# Patient Record
Sex: Male | Born: 1950
Health system: Southern US, Community
[De-identification: ages and names within clinical notes are randomized; demographics above are authoritative.]

## PROBLEM LIST (undated history)

## (undated) VITALS — BP 118/80 | HR 106 | Temp 97.0°F | Resp 18 | Ht 69.0 in | Wt 191.0 lb

## (undated) DIAGNOSIS — F1920 Other psychoactive substance dependence, uncomplicated: Secondary | ICD-10-CM

## (undated) DIAGNOSIS — H9319 Tinnitus, unspecified ear: Secondary | ICD-10-CM

## (undated) DIAGNOSIS — F1010 Alcohol abuse, uncomplicated: Secondary | ICD-10-CM

## (undated) DIAGNOSIS — E7800 Pure hypercholesterolemia, unspecified: Secondary | ICD-10-CM

## (undated) DIAGNOSIS — F32A Depression, unspecified: Secondary | ICD-10-CM

## (undated) DIAGNOSIS — C61 Malignant neoplasm of prostate: Secondary | ICD-10-CM

## (undated) DIAGNOSIS — I160 Hypertensive urgency: Secondary | ICD-10-CM

## (undated) DIAGNOSIS — F419 Anxiety disorder, unspecified: Secondary | ICD-10-CM

## (undated) DIAGNOSIS — F329 Major depressive disorder, single episode, unspecified: Secondary | ICD-10-CM

## (undated) HISTORY — DX: Malignant neoplasm of prostate: C61

## (undated) HISTORY — PX: TONSILLECTOMY: SUR1361

## (undated) HISTORY — DX: Pure hypercholesterolemia, unspecified: E78.00

## (undated) HISTORY — PX: WISDOM TOOTH EXTRACTION: SHX21

## (undated) HISTORY — DX: Tinnitus, unspecified ear: H93.19

---

## 2009-12-18 ENCOUNTER — Ambulatory Visit (HOSPITAL_COMMUNITY): Admission: RE | Admit: 2009-12-18 | Discharge: 2009-12-18 | Payer: Self-pay | Admitting: Psychiatry

## 2009-12-22 ENCOUNTER — Other Ambulatory Visit (HOSPITAL_COMMUNITY): Admission: RE | Admit: 2009-12-22 | Discharge: 2010-01-09 | Payer: Self-pay | Admitting: Psychiatry

## 2010-01-12 ENCOUNTER — Ambulatory Visit: Payer: Self-pay | Admitting: Psychiatry

## 2010-01-20 ENCOUNTER — Ambulatory Visit (HOSPITAL_COMMUNITY): Payer: Self-pay | Admitting: Psychiatry

## 2010-08-22 ENCOUNTER — Emergency Department (HOSPITAL_COMMUNITY)
Admission: EM | Admit: 2010-08-22 | Discharge: 2010-08-23 | Disposition: A | Discharge status: Other Acute Inpatient Hospital | Payer: 59 | Attending: Emergency Medicine | Admitting: Emergency Medicine

## 2010-08-22 DIAGNOSIS — F329 Major depressive disorder, single episode, unspecified: Secondary | ICD-10-CM | POA: Insufficient documentation

## 2010-08-22 DIAGNOSIS — F411 Generalized anxiety disorder: Secondary | ICD-10-CM | POA: Insufficient documentation

## 2010-08-22 DIAGNOSIS — F3289 Other specified depressive episodes: Secondary | ICD-10-CM | POA: Insufficient documentation

## 2010-08-22 DIAGNOSIS — R45851 Suicidal ideations: Secondary | ICD-10-CM | POA: Insufficient documentation

## 2010-08-22 LAB — COMPREHENSIVE METABOLIC PANEL
ALT: 35 U/L (ref 0–53)
AST: 23 U/L (ref 0–37)
Alkaline Phosphatase: 84 U/L (ref 39–117)
CO2: 25 mEq/L (ref 19–32)
Chloride: 105 mEq/L (ref 96–112)
GFR calc Af Amer: >60 mL/min (ref >60)
GFR calc non Af Amer: >60 mL/min (ref >60)
Glucose, Bld: 112 mg/dL — ABNORMAL HIGH (ref 70–99)
Sodium: 140 mEq/L (ref 135–145)
Total Bilirubin: 0.8 mg/dL (ref 0.3–1.2)

## 2010-08-22 LAB — ETHANOL: Alcohol, Ethyl (B): <5 mg/dL (ref 0–10)

## 2010-08-22 LAB — DIFFERENTIAL
Basophils Relative: 1 % (ref 0–1)
Eosinophils Absolute: 0.1 10*3/uL (ref 0.0–0.7)
Eosinophils Relative: 1 % (ref 0–5)
Lymphs Abs: 2 10*3/uL (ref 0.7–4.0)
Monocytes Relative: 7 % (ref 3–12)
Neutrophils Relative %: 70 % (ref 43–77)

## 2010-08-22 LAB — CBC
MCH: 29.9 pg (ref 26.0–34.0)
MCV: 85.9 fL (ref 78.0–100.0)
Platelets: 265 10*3/uL (ref 150–400)
RBC: 5.62 MIL/uL (ref 4.22–5.81)
RDW: 13.1 % (ref 11.5–15.5)
WBC: 9.1 10*3/uL (ref 4.0–10.5)

## 2010-08-22 LAB — RAPID URINE DRUG SCREEN, HOSP PERFORMED: Barbiturates: NOT DETECTED

## 2010-08-23 ENCOUNTER — Inpatient Hospital Stay (HOSPITAL_COMMUNITY)
Admission: RE | Admit: 2010-08-23 | Discharge: 2010-09-23 | DRG: 885 | Disposition: A | Discharge status: Home / Self Care | Payer: 59 | Source: Ambulatory Visit | Attending: Psychiatry | Admitting: Psychiatry

## 2010-08-23 DIAGNOSIS — F332 Major depressive disorder, recurrent severe without psychotic features: Principal | ICD-10-CM

## 2010-08-23 DIAGNOSIS — F329 Major depressive disorder, single episode, unspecified: Secondary | ICD-10-CM

## 2010-08-23 DIAGNOSIS — I10 Essential (primary) hypertension: Secondary | ICD-10-CM

## 2010-08-23 DIAGNOSIS — Z560 Unemployment, unspecified: Secondary | ICD-10-CM

## 2010-08-23 DIAGNOSIS — Z6379 Other stressful life events affecting family and household: Secondary | ICD-10-CM

## 2010-08-23 DIAGNOSIS — Z818 Family history of other mental and behavioral disorders: Secondary | ICD-10-CM

## 2010-08-23 DIAGNOSIS — F411 Generalized anxiety disorder: Secondary | ICD-10-CM

## 2010-08-23 DIAGNOSIS — IMO0001 Reserved for inherently not codable concepts without codable children: Secondary | ICD-10-CM

## 2010-08-23 DIAGNOSIS — F1421 Cocaine dependence, in remission: Secondary | ICD-10-CM

## 2010-08-23 DIAGNOSIS — R45851 Suicidal ideations: Secondary | ICD-10-CM

## 2010-08-23 DIAGNOSIS — E785 Hyperlipidemia, unspecified: Secondary | ICD-10-CM

## 2010-08-24 DIAGNOSIS — F1421 Cocaine dependence, in remission: Secondary | ICD-10-CM

## 2010-08-24 DIAGNOSIS — F411 Generalized anxiety disorder: Secondary | ICD-10-CM

## 2010-08-24 DIAGNOSIS — F332 Major depressive disorder, recurrent severe without psychotic features: Secondary | ICD-10-CM
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
## 2010-08-25 LAB — LIPID PANEL: VLDL: UNDETERMINED mg/dL (ref 0–40)

## 2010-08-25 LAB — FOLATE: Folate: 14.1 ng/mL

## 2010-08-25 LAB — VITAMIN B12: Vitamin B-12: 393 pg/mL (ref 211–911)

## 2010-08-25 LAB — TSH: TSH: 0.999 u[IU]/mL (ref 0.350–4.500)

## 2010-08-27 DIAGNOSIS — F1420 Cocaine dependence, uncomplicated: Secondary | ICD-10-CM

## 2010-08-27 DIAGNOSIS — F411 Generalized anxiety disorder: Secondary | ICD-10-CM

## 2010-08-27 DIAGNOSIS — F332 Major depressive disorder, recurrent severe without psychotic features: Secondary | ICD-10-CM

## 2010-08-29 LAB — VITAMIN D 1,25 DIHYDROXY
Vitamin D 1, 25 (OH)2 Total: 38 pg/mL (ref 18–72)
Vitamin D2 1, 25 (OH)2: <8 pg/mL
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
## 2011-12-13 ENCOUNTER — Emergency Department (HOSPITAL_COMMUNITY)
Admission: EM | Admit: 2011-12-13 | Discharge: 2011-12-13 | Disposition: A | Discharge status: Home / Self Care | Payer: Self-pay | Attending: Emergency Medicine | Admitting: Emergency Medicine

## 2011-12-13 ENCOUNTER — Encounter (HOSPITAL_COMMUNITY): Payer: Self-pay | Admitting: Emergency Medicine

## 2011-12-13 DIAGNOSIS — F32A Depression, unspecified: Secondary | ICD-10-CM | POA: Insufficient documentation

## 2011-12-13 DIAGNOSIS — F329 Major depressive disorder, single episode, unspecified: Secondary | ICD-10-CM | POA: Insufficient documentation

## 2011-12-13 DIAGNOSIS — F1920 Other psychoactive substance dependence, uncomplicated: Secondary | ICD-10-CM | POA: Insufficient documentation

## 2011-12-13 DIAGNOSIS — F411 Generalized anxiety disorder: Secondary | ICD-10-CM | POA: Insufficient documentation

## 2011-12-13 DIAGNOSIS — F419 Anxiety disorder, unspecified: Secondary | ICD-10-CM | POA: Insufficient documentation

## 2011-12-13 HISTORY — DX: Depression, unspecified: F32.A

## 2011-12-13 HISTORY — DX: Major depressive disorder, single episode, unspecified: F32.9

## 2011-12-13 HISTORY — DX: Other psychoactive substance dependence, uncomplicated: F19.20

## 2011-12-13 HISTORY — DX: Anxiety disorder, unspecified: F41.9

## 2011-12-13 LAB — RAPID URINE DRUG SCREEN, HOSP PERFORMED
Amphetamines: NOT DETECTED
Barbiturates: NOT DETECTED
Opiates: NOT DETECTED
Tetrahydrocannabinol: NOT DETECTED

## 2011-12-13 MED ORDER — LORAZEPAM 1 MG PO TABS
0.5000 mg | ORAL_TABLET | Freq: Once | ORAL | Status: AC
  Administered 2011-12-13: 0.5 mg via ORAL
  Filled 2011-12-13: qty 1

## 2011-12-13 MED ORDER — SODIUM CHLORIDE 0.9 % IV BOLUS (SEPSIS)
1000.0000 mL | Freq: Once | INTRAVENOUS | Status: AC
  Administered 2011-12-13: 1000 mL via INTRAVENOUS

## 2011-12-13 NOTE — ED Notes (Signed)
Patient states tonight he took his normal sleeping medication (Abilify); patient states that he started doing activities around the house (rather than lying down and trying to sleep), and patient forgot that he took his Ability already for the night.  Patient took a second tablet and states that he "went crazy" during the next hour and a half -- patient felt that he was watching a movie about his own life.  Patient was found in the closet by roommates -- patient felt like he was trapped and trying to get out.  EMS was called.  Upon EMS arrival, patient started to become more alert and oriented.  Upon arrival to ED, patient alert and oriented x4; PERRL present.  Will continue to monitor.

## 2011-12-13 NOTE — ED Provider Notes (Addendum)
History     CSN: [id number]  Arrival date & time 12/13/11  0154   First MD Initiated Contact with Patient 12/13/11 0204      Chief Complaint  Patient presents with  . Anxiety    (Consider location/radiation/quality/duration/timing/severity/associated sxs/prior treatment) HPI Comments: Patient presents by EMS from a recovery house for drug and alcohol addiction for anxiety.  Patient states he takes Abilify, Seroquel and Effexor for anxiety and depression.  He's been taking these as normal.  He takes Ambien for sleep and believes he may have taken an extra dose this evening but is not sure.  He notes having some kind of possible hallucinations or dreams where he felt he was being chased and handcuffed.  Apparently at the recovery house police were called because there is concerns about hallucinations and EMS arrived.  Patient has been coherent for EMS.  There is no report of any violent behavior or other incident.  Patient denies any pain or injury at this time.  Patient endorses that he is very anxious is very concerned about the possible hallucinations or dreams that he had.  He is fully alert and oriented at this time though.  Patient is a 61 y.o. male presenting with anxiety. The history is provided by the patient and the EMS personnel.  Anxiety This is a recurrent problem. Pertinent negatives include no chest pain, no abdominal pain, no headaches and no shortness of breath.    Past Medical History  Diagnosis Date  . Anxiety   . Depression   . Drug addiction     History reviewed. No pertinent past surgical history.  History reviewed. No pertinent family history.  History  Substance Use Topics  . Smoking status: Never Smoker   . Smokeless tobacco: Not on file  . Alcohol Use: No      Review of Systems  Constitutional: Negative.  Negative for fever and chills.  HENT: Negative.   Eyes: Negative.   Respiratory: Negative.  Negative for cough and shortness of breath.     Cardiovascular: Negative.  Negative for chest pain.  Gastrointestinal: Negative.  Negative for nausea, vomiting and abdominal pain.  Genitourinary: Negative.   Musculoskeletal: Negative.  Negative for back pain.  Skin: Negative.  Negative for color change and rash.  Neurological: Negative for syncope and headaches.  Hematological: Negative.  Negative for adenopathy.  Psychiatric/Behavioral: Negative for confusion. The patient is nervous/anxious.   All other systems reviewed and are negative.    Allergies  Review of patient's allergies indicates no known allergies.  Home Medications  No current outpatient prescriptions on file.  SpO2 96%  Physical Exam  Nursing note and vitals reviewed. Constitutional: He is oriented to person, place, and time. He appears well-developed and well-nourished.  Non-toxic appearance. He does not have a sickly appearance.  HENT:  Head: Normocephalic and atraumatic.  Eyes: Conjunctivae, EOM and lids are normal. Pupils are equal, round, and reactive to light.  Neck: Trachea normal, normal range of motion and full passive range of motion without pain. Neck supple.  Cardiovascular: Normal rate, regular rhythm and normal heart sounds.   Pulmonary/Chest: Effort normal and breath sounds normal. No respiratory distress.  Abdominal: Soft. Normal appearance. He exhibits no distension. There is no tenderness. There is no rebound and no CVA tenderness.  Musculoskeletal: Normal range of motion.  Neurological: He is alert and oriented to person, place, and time. He has normal strength.  Skin: Skin is warm, dry and intact. No rash noted.  Psychiatric:  His behavior is normal. Judgment and thought content normal.       Patient is anxious but otherwise has appropriate mood and affect.  His thought content judgment is clear and appropriate.  There's no tangential thinking.  Patient denies any homicidal or suicidal thoughts.    ED Course  Procedures (including critical  care time)  Results for orders placed during the hospital encounter of 12/13/11  ETHANOL      Component Value Range   Alcohol, Ethyl (B) <11  0 - 11 mg/dL  URINE RAPID DRUG SCREEN (HOSP PERFORMED)      Component Value Range   Opiates NONE DETECTED  NONE DETECTED   Cocaine NONE DETECTED  NONE DETECTED   Benzodiazepines NONE DETECTED  NONE DETECTED   Amphetamines NONE DETECTED  NONE DETECTED   Tetrahydrocannabinol NONE DETECTED  NONE DETECTED   Barbiturates NONE DETECTED  NONE DETECTED       MDM  Patient with what appears to be purely anxiety but no true hallucinations or suicidal or homicidal ideation at this time.  Patient has no symptoms for infection or altered mental status at this time.  Given patient was seen in recovery house for drug and alcohol addiction I will check for any substance abuse in this gentleman that might alter his anxiety levels.  I will give him some Ativan for his anxiety and anticipate the patient will be able to be safely discharged back to his recovery house.        Nat Christen, MD 12/13/11 0210 Patient is improved after receiving a small dose of Ativan here.  His drug and alcohol screens are negative consistent with the patient's story.  Patient is now safe for discharge back to his recovery facility.  Nat Christen, MD 12/13/11 [phone]

## 2011-12-13 NOTE — ED Notes (Addendum)
Per EMS, patient took unknown sleep aid tonight to help him sleep; was not able to sleep, so took two more tablets.  When patient awoken, patient felt very anxious - called EMS.  Patient has been under a lot of stress with his family lately.  Patient's roommates states that the last time an episode like this occurred, patient stopped taking his psych medications (patient reports taking his psych medications on a daily basis; denies missing dose).  Denies suicidal and homicidal thoughts.

## 2011-12-13 NOTE — ED Notes (Signed)
Pt updated by MD, pt to be discharged home soon.

## 2011-12-13 NOTE — Discharge Instructions (Signed)

## 2012-01-07 IMAGING — CR DG HAND 2V*R*
3 series · 3 of 3 positions shown · non-contrast
Comparison: None.

CLINICAL DATA: Fifth digit laceration

RIGHT HAND - 2 VIEW

[x hand pa right (1 of 2)]
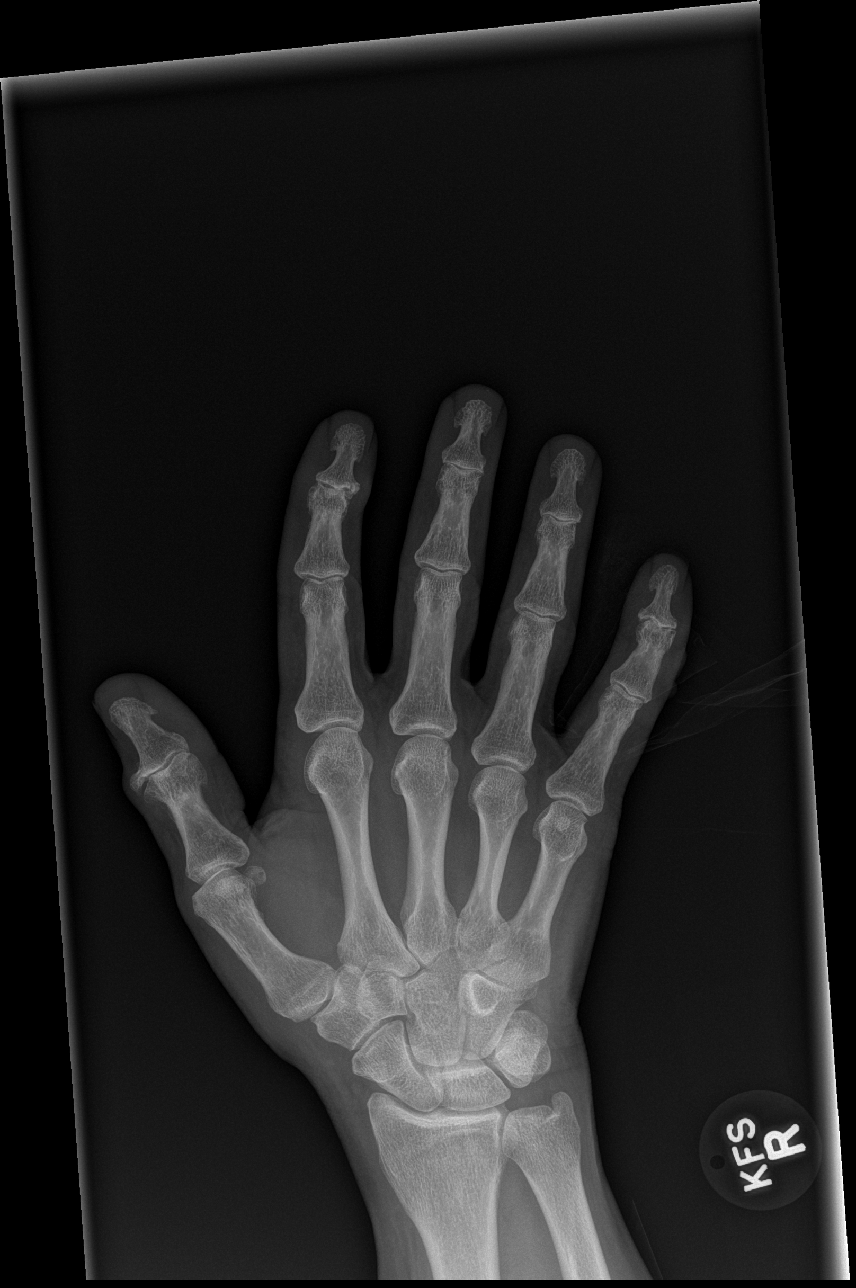

[x hand pa right (2 of 2)]
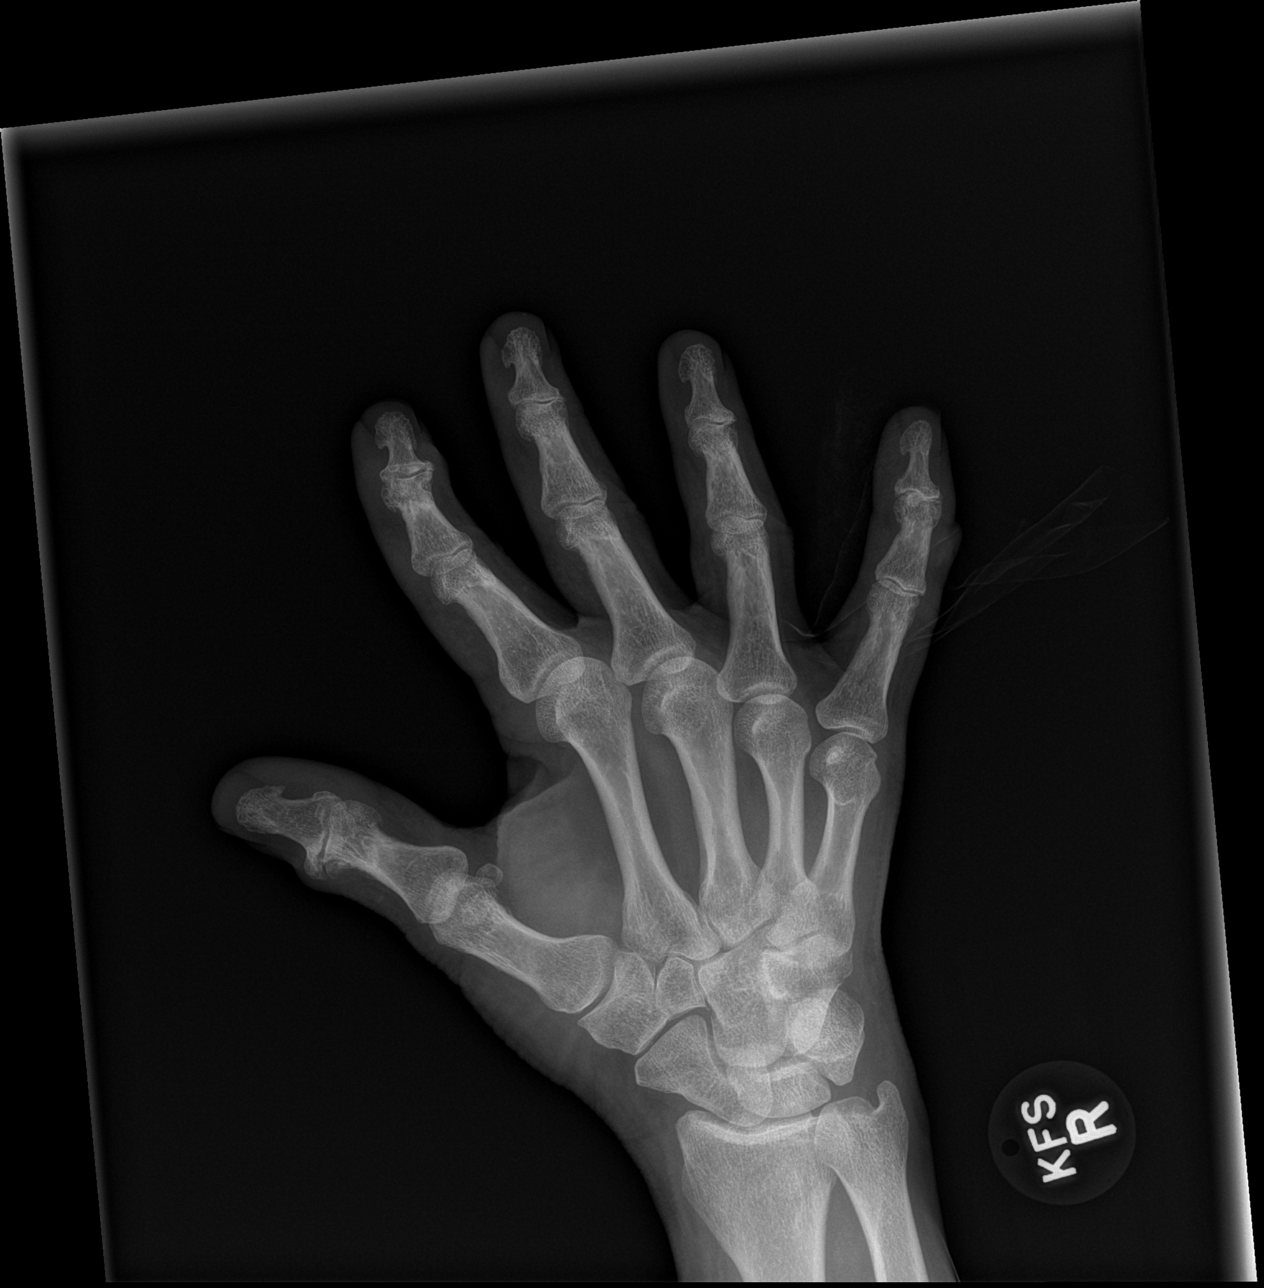

[x hand lat right]
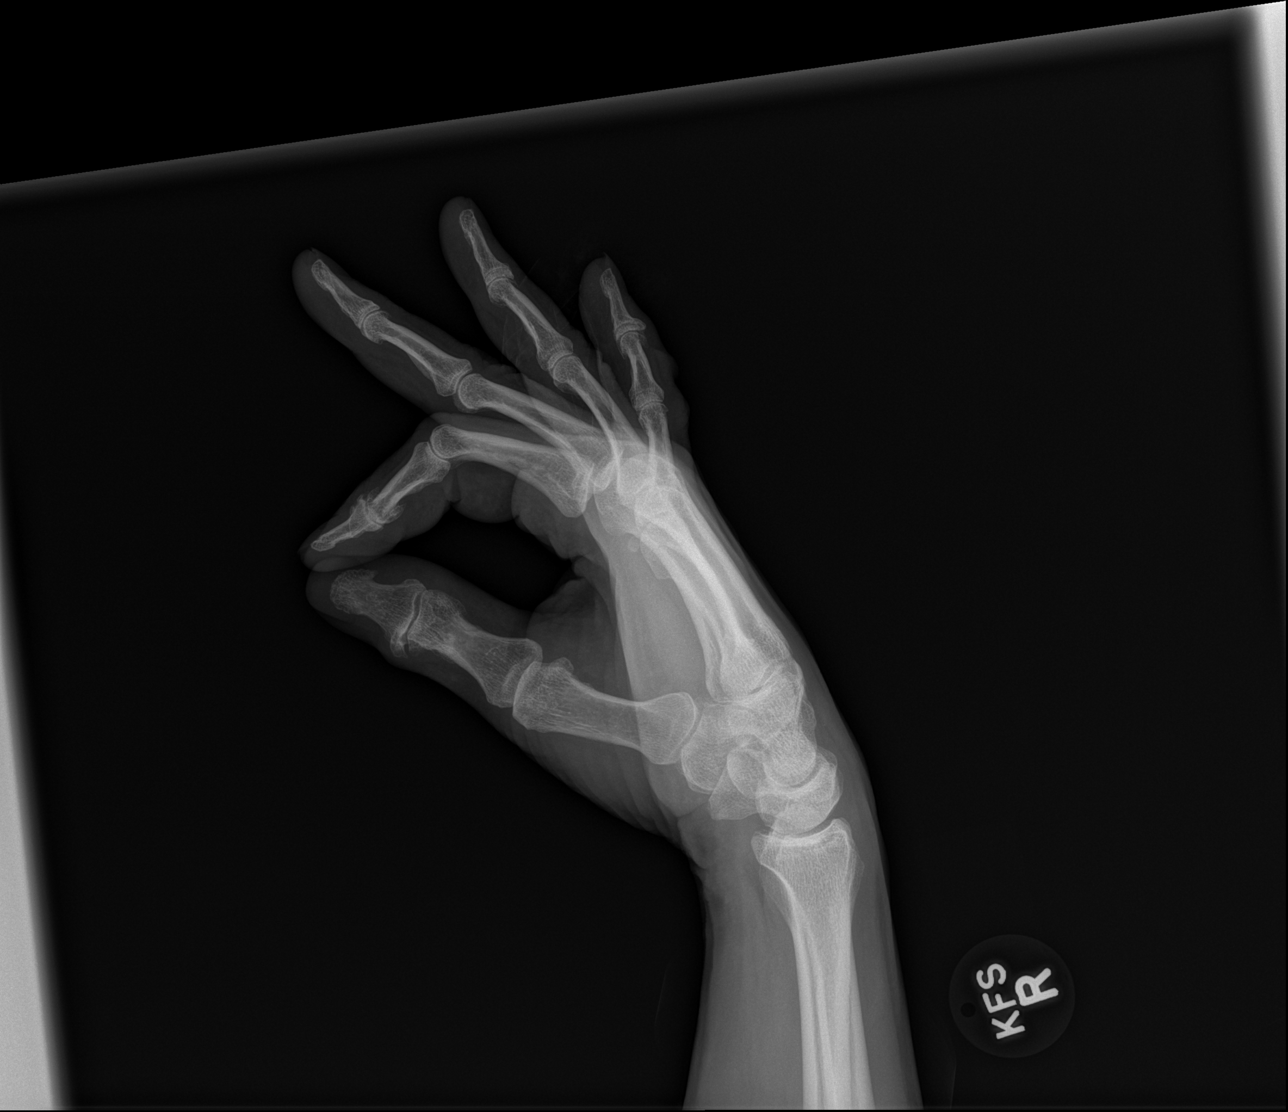

[3 of 3 positions shown; findings below may reference images not displayed]

FINDINGS: Osteoarthritic degenerative changes of the DIP joints and
first interphalangeal joint no acute fracture or dislocation
identified.  Bandage overlies the fifth digit in keeping with the
stated history of laceration.  No radiopaque foreign body.
IMPRESSION: Bandage overlies the fifth digit.  No radiopaque foreign body or
acute osseous abnormality.

## 2012-03-30 ENCOUNTER — Encounter (HOSPITAL_COMMUNITY): Payer: Self-pay | Admitting: Emergency Medicine

## 2012-03-30 ENCOUNTER — Emergency Department (HOSPITAL_COMMUNITY)
Admission: EM | Admit: 2012-03-30 | Discharge: 2012-03-31 | Disposition: A | Discharge status: Other Acute Inpatient Hospital | Payer: Self-pay | Attending: Emergency Medicine | Admitting: Emergency Medicine

## 2012-03-30 DIAGNOSIS — R45851 Suicidal ideations: Secondary | ICD-10-CM | POA: Insufficient documentation

## 2012-03-30 DIAGNOSIS — F1910 Other psychoactive substance abuse, uncomplicated: Secondary | ICD-10-CM

## 2012-03-30 DIAGNOSIS — F329 Major depressive disorder, single episode, unspecified: Secondary | ICD-10-CM

## 2012-03-30 DIAGNOSIS — F3289 Other specified depressive episodes: Secondary | ICD-10-CM | POA: Insufficient documentation

## 2012-03-30 LAB — CBC WITH DIFFERENTIAL/PLATELET
Basophils Relative: 1 % (ref 0–1)
Eosinophils Relative: 1 % (ref 0–5)
HCT: 43.5 % (ref 39.0–52.0)
Hemoglobin: 15.7 g/dL (ref 13.0–17.0)
Lymphocytes Relative: 29 % (ref 12–46)
MCHC: 36.1 g/dL — ABNORMAL HIGH (ref 30.0–36.0)
MCV: 82.4 fL (ref 78.0–100.0)
Monocytes Absolute: 0.5 10*3/uL (ref 0.1–1.0)
Monocytes Relative: 6 % (ref 3–12)
Neutro Abs: 4.5 10*3/uL (ref 1.7–7.7)

## 2012-03-30 LAB — COMPREHENSIVE METABOLIC PANEL
BUN: 15 mg/dL (ref 6–23)
CO2: 25 mEq/L (ref 19–32)
Chloride: 99 mEq/L (ref 96–112)
Creatinine, Ser: 1.04 mg/dL (ref 0.50–1.35)
GFR calc non Af Amer: 77 mL/min — ABNORMAL LOW (ref >90)
Total Bilirubin: 0.3 mg/dL (ref 0.3–1.2)

## 2012-03-30 LAB — RAPID URINE DRUG SCREEN, HOSP PERFORMED
Barbiturates: NOT DETECTED
Cocaine: NOT DETECTED

## 2012-03-30 LAB — ETHANOL: Alcohol, Ethyl (B): <11 mg/dL (ref 0–11)

## 2012-03-30 LAB — ACETAMINOPHEN LEVEL: Acetaminophen (Tylenol), Serum: <15 ug/mL (ref 10–30)

## 2012-03-30 MED ORDER — ACETAMINOPHEN 325 MG PO TABS: 650.0000 mg | ORAL_TABLET | ORAL | Status: DC | PRN

## 2012-03-30 MED ORDER — IBUPROFEN 600 MG PO TABS: 600.0000 mg | ORAL_TABLET | Freq: Three times a day (TID) | ORAL | Status: DC | PRN

## 2012-03-30 MED ORDER — ONDANSETRON HCL 4 MG PO TABS: 4.0000 mg | ORAL_TABLET | Freq: Three times a day (TID) | ORAL | Status: DC | PRN

## 2012-03-30 MED ORDER — LORAZEPAM 1 MG PO TABS
1.0000 mg | ORAL_TABLET | Freq: Three times a day (TID) | ORAL | Status: DC | PRN
  Administered 2012-03-30: 1 mg via ORAL
  Filled 2012-03-30: qty 1
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
## 2012-03-30 NOTE — ED Notes (Signed)
Report to Chari Manning, RN in psyc ED

## 2012-03-30 NOTE — ED Notes (Signed)
Is depressed, feels like wants to hurt self. Has hx of substance abuse- no particular substance-- will use anything, has lived in [facility] for 11 months, but used oxycontin 3 days ago, failed drug test today, now hopeless, now depressed.

## 2012-03-30 NOTE — ED Provider Notes (Signed)
History     CSN: [id number]  Arrival date & time 03/30/12  1302   First MD Initiated Contact with Patient 03/30/12 1320      Chief Complaint  Patient presents with  . Medical Clearance  . Depression    (Consider location/radiation/quality/duration/timing/severity/associated sxs/prior treatment) HPI Comments: Frank [name] is a 61 y.o. Male who presents with complaint of depression and SI. Pt states he is depressed, has not desire to do anything, just wants to stay in bed all day. States in [facility] house but got kicked out for buying oxycodone and using off the street. States hx of severe depression, has been through inpatient and outpatient treatments a year ago, states it helped. Pt does have a psychiatrist and is on medications, states taking them every day. States does not drink alcohol. States is thinking about suicide because "everything is falling apart and I do not know what to do and dont want to live like this."  Pt denies a plan. Pt has no medical problems.   The history is provided by the patient.    Past Medical History  Diagnosis Date  . Anxiety   . Depression   . Drug addiction     History reviewed. No pertinent past surgical history.  No family history on file.  History  Substance Use Topics  . Smoking status: Never Smoker   . Smokeless tobacco: Not on file  . Alcohol Use: No      Review of Systems  Constitutional: Negative for fever and chills.  HENT: Negative for neck pain and neck stiffness.   Respiratory: Negative.   Cardiovascular: Negative.   Genitourinary: Negative for dysuria.  Musculoskeletal: Negative.   Skin: Negative.   Neurological: Negative for dizziness, weakness and numbness.  Psychiatric/Behavioral: Positive for suicidal ideas. The patient is nervous/anxious.     Allergies  Review of patient's allergies indicates no known allergies.  Home Medications  No current outpatient prescriptions on file.  BP 124/94  Pulse 106  Temp 98.9  F (37.2 C) (Oral)  Resp 18  SpO2 96%  Physical Exam  Vitals reviewed. Constitutional: He is oriented to person, place, and time. He appears well-developed and well-nourished. No distress.  HENT:  Head: Normocephalic and atraumatic.  Neck: Neck supple.  Cardiovascular: Normal rate, regular rhythm and normal heart sounds.   Pulmonary/Chest: Effort normal and breath sounds normal. No respiratory distress. He has no wheezes. He has no rales.  Abdominal: Soft. Bowel sounds are normal. He exhibits no distension. There is no tenderness. There is no rebound.  Musculoskeletal: Normal range of motion.  Neurological: He is alert and oriented to person, place, and time.  Skin: Skin is warm and dry.  Psychiatric: He has a normal mood and affect. His behavior is normal. Thought content normal.    ED Course  Procedures (including critical care time)  Results for orders placed during the hospital encounter of 03/30/12  URINE RAPID DRUG SCREEN (HOSP PERFORMED)      Component Value Range   Opiates NONE DETECTED  NONE DETECTED   Cocaine NONE DETECTED  NONE DETECTED   Benzodiazepines NONE DETECTED  NONE DETECTED   Amphetamines NONE DETECTED  NONE DETECTED   Tetrahydrocannabinol NONE DETECTED  NONE DETECTED   Barbiturates NONE DETECTED  NONE DETECTED  CBC WITH DIFFERENTIAL      Component Value Range   WBC 7.1  4.0 - 10.5 K/uL   RBC 5.28  4.22 - 5.81 MIL/uL   Hemoglobin 15.7  13.0 -  17.0 g/dL   HCT 16.1  09.6 - 04.5 %   MCV 82.4  78.0 - 100.0 fL   MCH 29.7  26.0 - 34.0 pg   MCHC 36.1 (*) 30.0 - 36.0 g/dL   RDW 40.9  81.1 - 91.4 %   Platelets 288  150 - 400 K/uL   Neutrophils Relative 63  43 - 77 %   Neutro Abs 4.5  1.7 - 7.7 K/uL   Lymphocytes Relative 29  12 - 46 %   Lymphs Abs 2.0  0.7 - 4.0 K/uL   Monocytes Relative 6  3 - 12 %   Monocytes Absolute 0.5  0.1 - 1.0 K/uL   Eosinophils Relative 1  0 - 5 %   Eosinophils Absolute 0.1  0.0 - 0.7 K/uL   Basophils Relative 1  0 - 1 %    Basophils Absolute 0.1  0.0 - 0.1 K/uL  COMPREHENSIVE METABOLIC PANEL      Component Value Range   Sodium 135  135 - 145 mEq/L   Potassium 4.0  3.5 - 5.1 mEq/L   Chloride 99  96 - 112 mEq/L   CO2 25  19 - 32 mEq/L   Glucose, Bld 160 (*) 70 - 99 mg/dL   BUN 15  6 - 23 mg/dL   Creatinine, Ser 7.82  0.50 - 1.35 mg/dL   Calcium 9.3  8.4 - 95.6 mg/dL   Total Protein 7.0  6.0 - 8.3 g/dL   Albumin 3.8  3.5 - 5.2 g/dL   AST 17  0 - 37 U/L   ALT 25  0 - 53 U/L   Alkaline Phosphatase 94  39 - 117 U/L   Total Bilirubin 0.3  0.3 - 1.2 mg/dL   GFR calc non Af Amer 77 (*) >90 mL/min   GFR calc Af Amer 90 (*) >90 mL/min  ETHANOL      Component Value Range   Alcohol, Ethyl (B) <11  0 - 11 mg/dL  ACETAMINOPHEN LEVEL      Component Value Range   Acetaminophen (Tylenol), Serum <15.0  10 - 30 ug/mL  SALICYLATE LEVEL      Component Value Range   Salicylate Lvl <2.0 (*) 2.8 - 20.0 mg/dL   No results found.  3:39 PM Pt medically cleared, admits to SI, depression. Spoke with [facility] will assess. He is here voluntarily.    No diagnosis found.    MDM          Lottie Mussel, PA 04/01/12 1642

## 2012-03-31 ENCOUNTER — Inpatient Hospital Stay (HOSPITAL_COMMUNITY)
Admission: AD | Admit: 2012-03-31 | Discharge: 2012-04-11 | DRG: 885 | Disposition: A | Discharge status: Home / Self Care | Payer: No Typology Code available for payment source | Source: Ambulatory Visit | Attending: Psychiatry | Admitting: Psychiatry

## 2012-03-31 ENCOUNTER — Encounter (HOSPITAL_COMMUNITY): Payer: Self-pay | Admitting: *Deleted

## 2012-03-31 DIAGNOSIS — F329 Major depressive disorder, single episode, unspecified: Secondary | ICD-10-CM | POA: Diagnosis present

## 2012-03-31 DIAGNOSIS — F1120 Opioid dependence, uncomplicated: Secondary | ICD-10-CM | POA: Diagnosis present

## 2012-03-31 DIAGNOSIS — F419 Anxiety disorder, unspecified: Secondary | ICD-10-CM

## 2012-03-31 DIAGNOSIS — F1920 Other psychoactive substance dependence, uncomplicated: Secondary | ICD-10-CM

## 2012-03-31 DIAGNOSIS — F429 Obsessive-compulsive disorder, unspecified: Secondary | ICD-10-CM | POA: Diagnosis present

## 2012-03-31 DIAGNOSIS — M797 Fibromyalgia: Secondary | ICD-10-CM | POA: Diagnosis present

## 2012-03-31 DIAGNOSIS — F3130 Bipolar disorder, current episode depressed, mild or moderate severity, unspecified: Principal | ICD-10-CM | POA: Diagnosis present

## 2012-03-31 DIAGNOSIS — Z79899 Other long term (current) drug therapy: Secondary | ICD-10-CM

## 2012-03-31 DIAGNOSIS — G894 Chronic pain syndrome: Secondary | ICD-10-CM

## 2012-03-31 MED ORDER — ALUM & MAG HYDROXIDE-SIMETH 200-200-20 MG/5ML PO SUSP: 30.0000 mL | ORAL | Status: DC | PRN

## 2012-03-31 MED ORDER — MAGNESIUM HYDROXIDE 400 MG/5ML PO SUSP: 30.0000 mL | Freq: Every day | ORAL | Status: DC | PRN

## 2012-03-31 MED ORDER — CHLORPROMAZINE HCL 25 MG PO TABS
50.0000 mg | ORAL_TABLET | Freq: Once | ORAL | Status: AC
  Administered 2012-03-31: 50 mg via ORAL
  Filled 2012-03-31 (×3): qty 2

## 2012-03-31 MED ORDER — QUETIAPINE FUMARATE 300 MG PO TABS
600.0000 mg | ORAL_TABLET | Freq: Every day | ORAL | Status: DC
  Administered 2012-03-31 – 2012-04-05 (×6): 600 mg via ORAL
  Filled 2012-03-31 (×10): qty 2

## 2012-03-31 MED ORDER — VENLAFAXINE HCL ER 75 MG PO CP24
75.0000 mg | ORAL_CAPSULE | Freq: Two times a day (BID) | ORAL | Status: DC
  Administered 2012-03-31 – 2012-04-01 (×2): 75 mg via ORAL
  Filled 2012-03-31 (×8): qty 1

## 2012-03-31 MED ORDER — ZOLPIDEM TARTRATE 10 MG PO TABS
10.0000 mg | ORAL_TABLET | Freq: Every evening | ORAL | Status: DC | PRN
  Administered 2012-03-31: 10 mg via ORAL
  Filled 2012-03-31: qty 1

## 2012-03-31 MED ORDER — ACETAMINOPHEN 325 MG PO TABS: 650.0000 mg | ORAL_TABLET | Freq: Four times a day (QID) | ORAL | Status: DC | PRN

## 2012-03-31 MED ORDER — HYDROXYZINE HCL 25 MG PO TABS
25.0000 mg | ORAL_TABLET | Freq: Three times a day (TID) | ORAL | Status: DC | PRN
  Administered 2012-03-31: 25 mg via ORAL

## 2012-03-31 NOTE — Progress Notes (Signed)
Patient ID: Frank [name], male   DOB: May 06, 1951, 61 y.o.   MRN: [id number] 61 year old male voluntarily admitted to [facility]. Pt reports being kicked out of [facility] recently as  He took a couple pain pills and then he was administered a drug test in which he failed. Pt reports going through a divorce, having financial difficulties and is currently homeless as stressors. Pt does endorse depression and suicidal thoughts but able to contract for safety on the unit. Pt reports he had been at [facility] the past year before getting kicked out. Pt was oriented to the unit and safety maintained

## 2012-03-31 NOTE — ED Notes (Signed)
Attempted to call report to [facility], RN called back to get report.  BH wants to reassign room for pt.  BH will call us back when pt. Has a room.

## 2012-03-31 NOTE — Progress Notes (Signed)
Psychoeducational Group Note  Date:  03/31/2012 Time:  2000  Group Topic/Focus:  Wrap-Up Group:   The focus of this group is to help patients review their daily goal of treatment and discuss progress on daily workbooks.  Participation Level: Did Not Attend  Participation Quality:  Not Applicable  Affect:  Not Applicable  Cognitive:  Not Applicable  Insight:  Not Applicable  Engagement in Group: Not Applicable  Additional Comments:  The patient did not attend group this evening.

## 2012-03-31 NOTE — ED Provider Notes (Signed)
Pt accepted to [facility] by Dr. Dan Humphreys and Dr. Cedric Fishman.    Frank [name]. Manoj Enriquez, MD 03/31/12 1118

## 2012-03-31 NOTE — ED Notes (Signed)
Report called to Shon Baton, Charity fundraiser at [facility].  BH ready for pt.

## 2012-04-01 DIAGNOSIS — F1910 Other psychoactive substance abuse, uncomplicated: Secondary | ICD-10-CM

## 2012-04-01 DIAGNOSIS — G894 Chronic pain syndrome: Secondary | ICD-10-CM | POA: Diagnosis present

## 2012-04-01 DIAGNOSIS — F329 Major depressive disorder, single episode, unspecified: Secondary | ICD-10-CM

## 2012-04-01 DIAGNOSIS — M797 Fibromyalgia: Secondary | ICD-10-CM | POA: Diagnosis present

## 2012-04-01 MED ORDER — ZOLPIDEM TARTRATE 10 MG PO TABS
10.0000 mg | ORAL_TABLET | Freq: Every day | ORAL | Status: DC
  Administered 2012-04-01 – 2012-04-04 (×4): 10 mg via ORAL
  Filled 2012-04-01 (×4): qty 1

## 2012-04-01 MED ORDER — VENLAFAXINE HCL ER 150 MG PO CP24
150.0000 mg | ORAL_CAPSULE | Freq: Every day | ORAL | Status: DC
  Administered 2012-04-02 – 2012-04-03 (×2): 150 mg via ORAL
  Filled 2012-04-01 (×5): qty 1

## 2012-04-01 NOTE — H&P (Signed)
Psychiatric Admission Assessment Adult  Patient Identification:  Frank [name] Date of Evaluation:  04/01/2012 Chief Complaint:  Major Depressive Disorder Opioid Abuse                                                   UDS neg.  History of Present Illness:61yo DWM Presented to [facility]. Stated he had been living in an [facility] for the past 11 months used oxy and failed a UDS so he was"kicked out". Is depressed and feels like hurting himself .  Past Psychiatric History: Diagnosis:  Hospitalizations:[facility] 2012  [facility] 2012 (3mos) [facility] 2011   Outpatient Care: [facility] sees Dr. Ladona Ridgel  Substance Abuse Care:[facility]   Self-Mutilation:none   Suicidal Attempts:no   Violent Behaviors:no    Past Medical History:   Past Medical History  Diagnosis Date  . Anxiety   . Depression   . Drug addiction    None. Allergies:  No Known Allergies PTA Medications: Prescriptions prior to admission  Medication Sig Dispense Refill  . QUEtiapine (SEROQUEL) 300 MG tablet Take 600 mg by mouth at bedtime.      Marland Kitchen venlafaxine XR (EFFEXOR-XR) 75 MG 24 hr capsule Take 75 mg by mouth 2 (two) times daily.      Marland Kitchen zolpidem (AMBIEN) 10 MG tablet Take 10 mg by mouth at bedtime.        Previous Psychotropic Medications:  Medication/Dose                 Substance Abuse History in the last 12 months: Substance Age of 1st Use Last Use Amount Specific Type  Nicotine      Alcohol      Cannabis      Opiates      Cocaine      Methamphetamines      LSD      Ecstasy      Benzodiazepines      Caffeine      Inhalants      Others:                         Consequences of Substance Abuse: Legal Consequences:  lost his employment  Family Consequences:  received divorce papers a week ago   Social History: Current Place of Residence:   Place of Birth:   Family Members: Marital Status:  Divorced Children:  Sons: one son 62   Daughters:one age 32  Relationships: Education:  Chartered certified accountant  1980  Educational Problems/Performance: Religious Beliefs/Practices: History of Abuse (Emotional/Phsycial/Sexual) Occupational Experiences; Military History:  None. Legal History: Hobbies/Interests:  Family History:  History reviewed. No pertinent family history.  Mental Status Examination/Evaluation: Objective:  Appearance: Fairly Groomed  Eye Contact::  Good  Speech:  Normal Rate  Volume:  Normal  Mood:  Depressed, Hopeless, Irritable and Worthless  Affect:  Constricted and Depressed  Thought Process:  Goal Directed, Intact and Linear  Orientation:  Full  Thought Content:  Ruminates   Suicidal Thoughts:  Yes.  without intent/plan  Homicidal Thoughts:  No  Memory:  Immediate;   Good Recent;   Good Remote;   Good  Judgement:  Impaired  Insight:  Lacking  Psychomotor Activity:  Normal  Concentration:  Good  Recall:  Good  Akathisia:  No  Handed:  Right  AIMS (if  indicated):     Assets:  Communication Skills Physical Health Resilience Talents/Skills  Sleep:  Number of Hours: 3.75     Laboratory/X-Ray Psychological Evaluation(s)      Assessment:    AXIS I:  Substance Abuse               Depression  AXIS II:  Deferred AXIS III:   Past Medical History  Diagnosis Date  . Anxiety   . Depression   . Drug addiction    AXIS IV:  economic problems, housing problems, occupational problems, other psychosocial or environmental problems and problems with primary support group AXIS V:  31-40 impairment in reality testing  Treatment Plan/Recommendations:  Treatment Plan Summary: Daily contact with patient to assess and evaluate symptoms and progress in treatment Medication management Current Medications:  Current Facility-Administered Medications  Medication Dose Route Frequency Provider Last Rate Last Dose  . acetaminophen (TYLENOL) tablet 650 mg  650 mg Oral Q6H PRN Sanjuana Kava, NP      . alum & mag hydroxide-simeth (MAALOX/MYLANTA) 200-200-20 MG/5ML suspension  30 mL  30 mL Oral Q4H PRN Sanjuana Kava, NP      . chlorproMAZINE (THORAZINE) tablet 50 mg  50 mg Oral Once Mike Craze, MD   50 mg at 03/31/12 1714  . hydrOXYzine (ATARAX/VISTARIL) tablet 25 mg  25 mg Oral TID PRN Sanjuana Kava, NP   25 mg at 03/31/12 1715  . magnesium hydroxide (MILK OF MAGNESIA) suspension 30 mL  30 mL Oral Daily PRN Sanjuana Kava, NP      . QUEtiapine (SEROQUEL) tablet 600 mg  600 mg Oral QHS Sanjuana Kava, NP   600 mg at 03/31/12 2101  . venlafaxine XR (EFFEXOR-XR) 24 hr capsule 75 mg  75 mg Oral BID Sanjuana Kava, NP   75 mg at 04/01/12 1201  . zolpidem (AMBIEN) tablet 10 mg  10 mg Oral QHS Mickie D. Pernell Dupre, PA       Facility-Administered Medications Ordered in Other Encounters  Medication Dose Route Frequency Provider Last Rate Last Dose  . DISCONTD: acetaminophen (TYLENOL) tablet 650 mg  650 mg Oral Q4H PRN Tatyana A Kirichenko, PA      . DISCONTD: ibuprofen (ADVIL,MOTRIN) tablet 600 mg  600 mg Oral Q8H PRN Tatyana A Kirichenko, PA      . DISCONTD: LORazepam (ATIVAN) tablet 1 mg  1 mg Oral Q8H PRN Tatyana A Kirichenko, PA   1 mg at 03/30/12 2003  . DISCONTD: ondansetron (ZOFRAN) tablet 4 mg  4 mg Oral Q8H PRN Tatyana A Kirichenko, PA      . DISCONTD: zolpidem (AMBIEN) tablet 10 mg  10 mg Oral QHS PRN Ward Givens, MD   10 mg at 03/31/12 0121    Observation Level/Precautions:  Q38min   Laboratory:    Psychotherapy:    Medications:    Routine PRN Medications:  Yes  Consultations:    Discharge Concerns:    Other:     Vaughn Frieze,MICKIE D. 10/5/20132:25 PM

## 2012-04-01 NOTE — Progress Notes (Signed)
D- Patient is isolative to room. States "I got no sleep at all last night." Went to dining hall for lunch but has refused groups this shift. Rates depression at 8 and hopelessness at 10. "Off and on" SI and contracts for safety. Initiated conversation by Conservation officer, nature. Frank [name] was vague and guarded. States "I don't want to be here" even though he was admitted voluntarily.  Does not report s/s of opiate withdrawal. A- Encouraged fluids due to transient hypotension.  Also encouraged peer/group interaction. 15' checks for safety.  COWS assessment. Continue current POC and evaluation of treatment goals. R-  Guarded interaction and isolative to room.  Lunch eaten. Remains safe on the unit.

## 2012-04-01 NOTE — [facility] Counselor (Signed)
Adult Comprehensive Assessment  Patient ID: Frank [name], male   DOB: 07-Jan-1951, 61 y.o.   MRN: [id number]  Information Source: Information source: Patient  Current Stressors:  Educational / Learning stressors: NA Employment / Job issues: No job, lost job due to economy.  Client states that this was the starting point of his problems. Family Relationships: Recent divorce.  Not relationship with children. Financial / Lack of resources (include bankruptcy): No income. Housing / Lack of housing: Lost housing at [employer]. Physical health (include injuries & life threatening diseases): NA Social relationships: No supports. Substance abuse: Pain medication. Bereavement / Loss: Feels as if he has "lost everything"  Living/Environment/Situation:  Living Arrangements: Non-relatives/Friends;Alone ([facility]) Living conditions (as described by patient or guardian): wasnt really good How long has patient lived in current situation?: almost a year What is atmosphere in current home: Temporary;Chaotic  Family History:  Marital status: Divorced Divorced, when?: 2 weeks ago What types of issues is patient dealing with in the relationship?: NA Additional relationship information: NA Does patient have children?: Yes How many children?: 2  How is patient's relationship with their children?: Non existant  Childhood History:  By whom was/is the patient raised?: Both parents Additional childhood history information: NA Description of patient's relationship with caregiver when they were a child: I don't know Patient's description of current relationship with people who raised him/her: NA Does patient have siblings?: Yes Number of Siblings: 2  Description of patient's current relationship with siblings: NA Did patient suffer any verbal/emotional/physical/sexual abuse as a child?: Yes (Refused to divulge) Did patient suffer from severe childhood neglect?: Yes Patient description of severe  childhood neglect: NA Has patient ever been sexually abused/assaulted/raped as an adolescent or adult?: No Was the patient ever a victim of a crime or a disaster?: No Witnessed domestic violence?: No Has patient been effected by domestic violence as an adult?: No  Education:  Highest grade of school patient has completed: Some graduate work after college, studied Academic librarian and English Currently a Consulting civil engineer?: No Name of school: [employer] Learning disability?: No  Employment/Work Situation:   Employment situation: Unemployed Patient's job has been impacted by current illness: Yes Describe how patient's job has been implacted: No job What is the longest time patient has a held a job?: 10 years 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 [employer] Has patient ever been in the [employer]?: No Has patient ever served in Buyer, retail?: No  Financial Resources:   Financial resources: No income Does patient have a Lawyer or guardian?: No  Alcohol/Substance Abuse:   What has been your use of drugs/alcohol within the last 12 months?: No ETOH, Abused pain pills periodically If attempted suicide, did drugs/alcohol play a role in this?: No Alcohol/Substance Abuse Treatment Hx: Past Tx, Inpatient;Past Tx, Outpatient If yes, describe treatment: Both, [facility] Has alcohol/substance abuse ever caused legal problems?: Yes (Possession)  Social Support System:   Patient's Community Support System: None Describe Community Support System: No supports.  Been isolating Type of faith/religion: Lost religion How does patient's faith help to cope with current illness?: NA  Leisure/Recreation:   Leisure and Hobbies: Not anymore, used to like to hike outdoors, go to [employer]:   What things does the patient do well?: Nothing anymore, teaching in the past In what areas does patient struggle / problems for patient: Struggles with everything right now, lost  everything  Discharge Plan:   Does patient have access to transportation?: No  Plan for no access to transportation at discharge: Nothing, noone Will patient be returning to same living situation after discharge?: No Plan for living situation after discharge: will need help planning, won't be admitted back probably, asked to leave due to drug use Currently receiving community mental health services: No If no, would patient like referral for services when discharged?: Yes (What county?) Medical sales representative) Does patient have financial barriers related to discharge medications?: Yes Patient description of barriers related to discharge medications: No income  Summary/Recommendations:   Summary and Recommendations (to be completed by the evaluator): Recommendations include crisis stabilization, case management, medication management, psycho-education groups to teach coping skills and group therapy.   Frank [name]. 04/01/2012

## 2012-04-01 NOTE — H&P (Signed)
  Pt was seen by me today and I agree with the key elements documented in H&P.  

## 2012-04-01 NOTE — Progress Notes (Signed)
Patient ID: Frank [name], male   DOB: [birth year], 61 y.o.   MRN: [id number]  Problem: Depression, Substance Abuse  D: Patient with minimal interaction with staff and endorses depression.  A: Monitor patient Q 15 minutes for safety, encourage staff/peer interaction, administer medications as ordered by MD.  R: Patient compliant with medications and attended group session. No inappropriate behaviors noted at this time.

## 2012-04-01 NOTE — Progress Notes (Signed)
Patient ID: Frank [name], male   DOB: 11-27-1950, 61 y.o.   MRN: [id number]  Problem: Anxiety, Depression, Drug Addiction  D: Patient c/o anxiety, and is focused on his medications. No interaction in milieu.   A: Monitor patient Q 15 minutes for safety, encourage staff/peer interaction, administer medications as ordered by MD.  R: Patient did not attend group session, but was compliant with medications. Patient with passive SI but contracts for safety.

## 2012-04-01 NOTE — ED Provider Notes (Signed)
Medical screening examination/treatment/procedure(s) were performed by non-physician practitioner and as supervising physician I was immediately available for consultation/collaboration.   Markees Carns, MD 04/01/12 2320 

## 2012-04-01 NOTE — [facility] Suicide Risk Assessment (Signed)
Suicide Risk Assessment  Admission Assessment     Nursing information obtained from:  Patient Demographic factors:  Male;Divorced or widowed;Caucasian;Low socioeconomic status;Living alone Current Mental Status:  Self-harm thoughts Loss Factors:  Loss of significant relationship;Financial problems / change in socioeconomic status Historical Factors:  Family history of mental illness or substance abuse, hx of 1 SI attempt in past Risk Reduction Factors:  NA  CLINICAL FACTORS:   Depression:   Hopelessness  COGNITIVE FEATURES THAT CONTRIBUTE TO RISK:  Closed-mindedness    SUICIDE RISK:   Moderate:  Frequent suicidal ideation with limited intensity, and duration, some specificity in terms of plans, no associated intent, good self-control, limited dysphoria/symptomatology, some risk factors present, and identifiable protective factors, including available and accessible social support.  PLAN OF CARE: Mental Status Examination/Evaluation:  Objective: Appearance: Fairly Groomed   Eye Contact:: Good   Speech: Normal Rate   Volume: Normal   Mood: Depressed, Hopeless, Irritable and Worthless   Affect: Constricted and Depressed   Thought Process: Goal Directed, Intact and Linear   Orientation: Full   Thought Content: Ruminates   Suicidal Thoughts: Yes. without intent/plan   Homicidal Thoughts: No   Memory: Immediate; Good  Recent; Good  Remote; Good   Judgement: Impaired   Insight: Lacking   Psychomotor Activity: Normal   Concentration: Good   Recall: Good   Akathisia: No   Handed: Right   AIMS (if indicated):   Assets: Communication Skills  Physical Health  Resilience  Talents/Skills   Sleep: Number of Hours: 3.75    Laboratory/X-Ray  Psychological Evaluation(s)      Assessment:  AXIS I: dep d/o nos Depression  AXIS II: Deferred  AXIS III:  Past Medical History   Diagnosis  Date   .  Anxiety    .  Depression    .  Drug addiction     AXIS IV: economic problems,  housing problems, occupational problems, other psychosocial or environmental problems and problems with primary support group  AXIS V: 31-40 impairment in reality testing  Treatment Plan/Recommendations:  Treatment Plan Summary:  Daily contact with patient to assess and evaluate symptoms and progress in treatment  Medication management   Wonda Cerise 04/01/2012, 2:44 PM

## 2012-04-01 NOTE — Progress Notes (Signed)
[facility] Group Notes:  (Counselor/Nursing/MHT/Case Management/Adjunct)  04/01/2012 11:59 PM  Type of Therapy:  Psychoeducational Skills  Participation Level:  Minimal  Participation Quality:  Attentive  Affect:  Appropriate  Cognitive:  Appropriate  Insight:  Limited  Engagement in Group:  Limited  Engagement in Therapy:  Limited  Modes of Intervention:  Education  Summary of Progress/Problems: The patient stated that he didn't have a very good day since he didn't sleep well last night. No goal was stated for tomorrow.    Nayleen Janosik S 04/01/2012, 11:59 PM

## 2012-04-01 NOTE — Progress Notes (Signed)
Patient ID: COLBURN ASPER, male   DOB: [birth year], 61 y.o.   MRN: [id number]   [facility] Group Notes:  (Counselor/Nursing/MHT/Case Management/Adjunct)  04/01/2012 1:15 PM  Type of Therapy:  Group Therapy, Dance/Movement Therapy   Participation Level:  Did Not Attend     Cassidi Long 04/01/2012. 3:34 PM

## 2012-04-01 NOTE — Progress Notes (Signed)
Patient ID: Frank [name], male   DOB: Apr 23, 1951, 61 y.o.   MRN: [id number]  Pt did not attend aftercare planning group.

## 2012-04-02 LAB — HEMOGLOBIN A1C: Hgb A1c MFr Bld: 5.9 % — ABNORMAL HIGH (ref <5.7)

## 2012-04-02 LAB — LIPID PANEL
HDL: 36 mg/dL — ABNORMAL LOW (ref >39)
LDL Cholesterol: 245 mg/dL — ABNORMAL HIGH (ref 0–99)
Total CHOL/HDL Ratio: 9.2 RATIO
VLDL: 49 mg/dL — ABNORMAL HIGH (ref 0–40)

## 2012-04-02 LAB — GLUCOSE, CAPILLARY: Glucose-Capillary: 109 mg/dL — ABNORMAL HIGH (ref 70–99)

## 2012-04-02 NOTE — Progress Notes (Addendum)
Pt is in the dayroom watching HBO. Asked if he could take his meds a little later. No Si or Hi and contracts for safety. Pt is very pleasant and continues to remain safe with q 15 min checks. Pt almost started to cry when talking to the nurse about his misfortunes. Stated he was married times 34 years and his wife just divorced him. In addtion he last his job two years ago . Did state he has a college degree and was in marketing for awhile stated neither of his two children will speak to him He does have a positive relationship with a brother who lives in [place] who reaches out to the pt. Pt stated he feels very ashamed and has been reaching back to his brother. He thanked the nurse for listening and then retreated to his room.

## 2012-04-02 NOTE — Progress Notes (Signed)
[facility] Adult Inpatient Family/Significant Other Suicide Prevention Education  Suicide Prevention Education:  Patient Refusal for Family/Significant Other Suicide Prevention Education: The patient Frank [name] has refused to provide written consent for family/significant other to be provided Family/Significant Other Suicide Prevention Education during admission and/or prior to discharge.  Physician notified.  Pt. accepted information on suicide prevention, warning signs to look for with suicide and crisis line numbers to use. The pt. agreed to call crisis line numbers if having warning signs or having thoughts of suicide.    [facility] 04/02/2012, 5:11 PM

## 2012-04-02 NOTE — Progress Notes (Signed)
Patient ID: Frank [name], male   DOB: 12/27/1950, 61 y.o.   MRN: [id number] Pt did not attend aftercare planning group.  Pt has received suicide prevention information.

## 2012-04-02 NOTE — Progress Notes (Signed)
  Frank [name] is a 61 y.o. male [id number] 1950/11/13  03/31/2012 Principal Problem:  *Chronic pain associated with significant psychosocial dysfunction Active Problems:  Depression   Mental Status: Mood is still depressed. Denies active SI/HI/AVH.   Subjective/Objective: One of his housemates from the [facility] came by last night and left his phone number. Patient can go back if he chooses. Has no income as unemployment  ran out and he had been using his savings to pay his fees. Still grieving loss of his  former life and work. Not sure what would help him at this time.    Filed Vitals:   04/01/12 2051  BP: 104/71  Pulse: 103  Temp:   Resp:     Lab Results:   BMET    Component Value Date/Time   NA 135 03/30/2012 1336   K 4.0 03/30/2012 1336   CL 99 03/30/2012 1336   CO2 25 03/30/2012 1336   GLUCOSE 160* 03/30/2012 1336   BUN 15 03/30/2012 1336   CREATININE 1.04 03/30/2012 1336   CALCIUM 9.3 03/30/2012 1336   GFRNONAA 77* 03/30/2012 1336   GFRAA 90* 03/30/2012 1336    Medications:  Scheduled:     . QUEtiapine  600 mg Oral QHS  . venlafaxine XR  150 mg Oral QPC breakfast  . zolpidem  10 mg Oral QHS  . DISCONTD: venlafaxine XR  75 mg Oral BID     PRN Meds acetaminophen, alum & mag hydroxide-simeth, hydrOXYzine, magnesium hydroxide Will check CBG's and Hgb A1C to check prior glucose of 160.  Ariyel Jeangilles,MICKIE D. 04/02/2012

## 2012-04-02 NOTE — Progress Notes (Signed)
Patient ID: KORREY SCHLEICHER, male   DOB: 27-Sep-1950, 61 y.o.   MRN: [id number]   [facility] Group Notes:  (Counselor/Nursing/MHT/Case Management/Adjunct)  04/02/2012 1:15 PM  Type of Therapy:  Group Therapy, Dance/Movement Therapy   Participation Level:  None  Participation Quality:  Resistant  Affect:  Depressed  Cognitive:  Appropriate  Insight:  None  Engagement in Group:  None  Engagement in Therapy:  None  Modes of Intervention:  Clarification, Problem-solving, Role-play, Socialization and Support  Summary of Progress/Problems: Therapist and group members discussed how we know when we need help, appropriate support systems, and changing our perceptions. Group members shared experiences and what they find important in a support. When asked why his recovery was important, pt stated that he didn't know yet. Pt sat in group with his head in his heads implying that he was struggling and did not know how to process thoughts and feelings.     Cassidi Long 04/02/2012. 3:48 PM

## 2012-04-02 NOTE — Progress Notes (Signed)
Patient ID: Frank [name], male   DOB: 1950-06-29, 61 y.o.   MRN: [id number]   Good sleep. Better mood today. No acute new issues. Thinking meds are helping now.    Mental Status Examination/Evaluation:  Objective: Appearance: Fairly Groomed   Eye Contact:: Good   Speech: Normal Rate   Volume: Normal   Mood: Depressed, Hopeless, Irritable and Worthless   Affect: Constricted and Depressed   Thought Process: Goal Directed, Intact and Linear   Orientation: Full   Thought Content: Ruminates   Suicidal Thoughts: no  Homicidal Thoughts: No   Memory: Immediate; Good  Recent; Good  Remote; Good   Judgement: Impaired   Insight: Lacking   Psychomotor Activity: Normal   Concentration: Good   Recall: Good   Akathisia: No   Handed: Right   AIMS (if indicated):   Assets: Communication Skills  Physical Health  Resilience  Talents/Skills   Sleep: Number of Hours: 3.75    Laboratory/X-Ray  Psychological Evaluation(s)      Assessment:  AXIS I: dep d/o nos  Depression  AXIS II: Deferred  AXIS III:  Past Medical History   Diagnosis  Date   .  Anxiety    .  Depression    .  Drug addiction    AXIS IV: economic problems, housing problems, occupational problems, other psychosocial or environmental problems and problems with primary support group  AXIS V: 31-40 impairment in reality testing  Treatment Plan/Recommendations:  Treatment Plan Summary:   Continue current meds

## 2012-04-02 NOTE — Progress Notes (Signed)
Patient ID: Frank [name], male   DOB: Oct 25, 1950, 61 y.o.   MRN: [id number] D: Pt. In bed, eyes closed, resp. Even. A: Pt. Will be monitored q74min for safety. Writer will monitor for discomfort/distress. R: Pt. Is safe on the unit. No distress noted.

## 2012-04-02 NOTE — Progress Notes (Signed)
Frank [name] continues to be isolative to room with minimal staff and peer interaction. Is currently in afternoon counseling group. "Off and on" SI and contracts for safety.  Rates depression at 8 and hopelessness at 10.Reports fair sleep and appetite.Continues to be unwilling to discuss reasons for admission. A- Support and encouragement offered. 12 step concepte reenforced.  Continue current POC and evaluation of treatment goals.  Medication education offered. 15' checks for safety. R-States is familiar with currently prescribed anti-depressant. No prn medications requested. Remains safe on the unit.

## 2012-04-03 DIAGNOSIS — F3130 Bipolar disorder, current episode depressed, mild or moderate severity, unspecified: Principal | ICD-10-CM

## 2012-04-03 DIAGNOSIS — F429 Obsessive-compulsive disorder, unspecified: Secondary | ICD-10-CM

## 2012-04-03 LAB — GLUCOSE, CAPILLARY
Glucose-Capillary: 109 mg/dL — ABNORMAL HIGH (ref 70–99)
Glucose-Capillary: 93 mg/dL (ref 70–99)

## 2012-04-03 MED ORDER — VENLAFAXINE HCL ER 37.5 MG PO CP24
37.5000 mg | ORAL_CAPSULE | Freq: Every day | ORAL | Status: AC
  Administered 2012-04-07 – 2012-04-09 (×3): 37.5 mg via ORAL
  Filled 2012-04-03 (×3): qty 1

## 2012-04-03 MED ORDER — GRX ANALGESIC BALM EX OINT
1.0000 "application " | TOPICAL_OINTMENT | Freq: Four times a day (QID) | CUTANEOUS | Status: DC
  Filled 2012-04-03: qty 28
  Filled 2012-04-03: qty 85
  Filled 2012-04-03: qty 28

## 2012-04-03 MED ORDER — AMITRIPTYLINE HCL 25 MG PO TABS
25.0000 mg | ORAL_TABLET | Freq: Every day | ORAL | Status: DC
  Administered 2012-04-03 – 2012-04-05 (×3): 25 mg via ORAL
  Filled 2012-04-03 (×6): qty 1

## 2012-04-03 MED ORDER — PANTOPRAZOLE SODIUM 20 MG PO TBEC
20.0000 mg | DELAYED_RELEASE_TABLET | Freq: Two times a day (BID) | ORAL | Status: DC
  Administered 2012-04-05 – 2012-04-11 (×13): 20 mg via ORAL
  Filled 2012-04-03 (×7): qty 1
  Filled 2012-04-03: qty 28
  Filled 2012-04-03 (×2): qty 1
  Filled 2012-04-03: qty 28
  Filled 2012-04-03 (×10): qty 1

## 2012-04-03 MED ORDER — VENLAFAXINE HCL ER 75 MG PO CP24
75.0000 mg | ORAL_CAPSULE | Freq: Every day | ORAL | Status: AC
  Administered 2012-04-04 – 2012-04-06 (×3): 75 mg via ORAL
  Filled 2012-04-03 (×4): qty 1

## 2012-04-03 MED ORDER — MUSCLE RUB 10-15 % EX CREA: TOPICAL_CREAM | Freq: Three times a day (TID) | CUTANEOUS | Status: DC

## 2012-04-03 MED ORDER — MELOXICAM 7.5 MG PO TABS
7.5000 mg | ORAL_TABLET | Freq: Every day | ORAL | Status: DC
  Administered 2012-04-03 – 2012-04-10 (×8): 7.5 mg via ORAL
  Filled 2012-04-03 (×12): qty 1

## 2012-04-03 NOTE — Progress Notes (Signed)
[facility] Group Notes:  (Counselor/Nursing/MHT/Case Management/Adjunct)  04/03/2012 12:07 AM  Type of Therapy:  Psychoeducational Skills  Participation Level:  Active  Participation Quality:  Appropriate  Affect:  Appropriate  Cognitive:  Appropriate  Insight:  Limited  Engagement in Group:  Good  Engagement in Therapy:  Good  Modes of Intervention:  Education  Summary of Progress/Problems: The patient mentioned in group this evening that he had a good day since he slept well last evening. He would not elaborate on the rest of his day. In terms of support (theme of the day), he states that he has none.    Shaquia Berkley S 04/03/2012, 12:07 AM

## 2012-04-03 NOTE — Progress Notes (Signed)
Psychoeducational Group Note  Date:  04/03/2012 Time:  2000  Group Topic/Focus:  Wrap-Up Group:   The focus of this group is to help patients review their daily goal of treatment and discuss progress on daily workbooks.  Participation Level:  Minimal  Participation Quality:  Resistant  Affect:  Depressed  Cognitive:  Oriented  Insight:  Limited  Engagement in Group:  Limited  Additional Comments:  Patient shared that he was anxious about going to groups and anxious about being in wrap-up group this evening.  Jayda White, Newton Pigg 04/03/2012, 9:09 PM

## 2012-04-03 NOTE — Progress Notes (Signed)
Patient ID: Frank [name], male   DOB: [birth year], 61 y.o.   MRN: [id number] D- Patient reports fair sleep and improving appetite.  His energy level is low and his ability to pay attention is poor.  A- asked patient about his pain which he says he thinks is fibromyalgia and is all over mainly in neck and shoulders. R- He has been in bed all day, resting and sleeping.  He has been encouraged to go to groups but has not done so.

## 2012-04-03 NOTE — Progress Notes (Signed)
Patient ID: Frank [name], male   DOB: [birth year], 61 y.o.   MRN: [id number]  Pt. attended and participated in aftercare planning group. Pt. listed their current anxiety level as 6-7 and current depression level as 6-7. No S/I or H/I. Pt shared that he was not doing well this morning and is unsure about aftercare plans. Pt shared that he feels "stuck" and not knowing what he wants is apart of the problem.

## 2012-04-03 NOTE — Progress Notes (Signed)
[facility] Group Notes:  (Counselor/Nursing/MHT/Case Management/Adjunct)  04/03/2012 3:00 PM  Type of Therapy: Group Therapy   Participation Level: Minimal   Participation Quality: Limited  Affect: Depressed  Cognitive: oriented, alert   Insight: minimal  Engagement in Group: Limited  Modes of Intervention: Clarification, Education, Problem-solving, Socialization, Encouragement and Support   Summary of Progress/Problems: Pt participated in group by listening attentively and self disclosing.  Therapist addressed the whole person concept of Wellness.  Therapist prompted Patients to identify barriers to recovery.  Therapist guided patients to discuss thoughts, feelings, and behaviors related to the barriers.  Therapist challenged patients to identify changes they are motivatied to make in order to overcome their barriers.  Pt stated he did not know where to start identifying  Barriers.  He stated he had some ideas about changes he needs to make, but he did not want to talk about them at this time. Therapist offered support and encouragement.  Some progress noted.  Intervention effective.         Marni Griffon C 10/7//2013  3:00 PM

## 2012-04-03 NOTE — Progress Notes (Signed)
D-Per pt self inventory, pt slept fair, appetite is improving, energy level is low, ability to pay attention is poor, rates depression at a 7 out of 10 scale, hopelessness at a 9 out of 10 scale, pt isolative in room sleeping most of the day, denies SI/HI/AVH and contracts for safety, pt having joint pain at a 6 out of 10 pain scale.  A-Encouraged pt to participate in groups, MD made aware that pt having joint pain  R-muscle rub and mobic ordered for pt, pt denies pain at this time and is out of room in day room wacthing tv with other pts after dinner.

## 2012-04-03 NOTE — Progress Notes (Signed)
Patient ID: Frank [name], male   DOB: 1951/01/10, 61 y.o.   MRN: [id number]  Pt noting not much benefit to med changes.  Got Luvox started for tonight.  BP up today, Clonidine on e dose given with good results.  Mental Status Examination/Evaluation:  Objective: Appearance: Fairly Groomed   Eye Contact:: Good   Speech: Normal Rate   Volume: Normal   Mood: Depressed, Hopeless, Irritable and Worthless   Affect: Constricted and Depressed   Thought Process: Goal Directed, Intact and Linear   Orientation: Full   Thought Content: Ruminates   Suicidal Thoughts: no  Homicidal Thoughts: No   Memory: Immediate; Good  Recent; Good  Remote; Good   Judgement: Impaired   Insight: Lacking   Psychomotor Activity: Normal   Concentration: Good   Recall: Good   Akathisia: No   Handed: Right   AIMS (if indicated):   Assets: Communication Skills  Physical Health  Resilience  Talents/Skills   Sleep: Number of Hours: 3.75    Laboratory/X-Ray  Psychological Evaluation(s)      Assessment:  AXIS I: dep d/o nos  Depression  OCD AXIS II: Deferred  AXIS III:  Past Medical History   Diagnosis  Date   .  Anxiety    .  Depression    .  Drug addiction    AXIS IV: economic problems, housing problems, occupational problems, other psychosocial or environmental problems and problems with primary support group  AXIS V: 31-40 impairment in reality testing  Treatment Plan/Recommendations:  Treatment Plan Summary:  Prescriber see daily and adjust medications as indicated.

## 2012-04-04 LAB — GLUCOSE, CAPILLARY
Glucose-Capillary: 106 mg/dL — ABNORMAL HIGH (ref 70–99)
Glucose-Capillary: 145 mg/dL — ABNORMAL HIGH (ref 70–99)

## 2012-04-04 MED ORDER — FLUVOXAMINE MALEATE 50 MG PO TABS
25.0000 mg | ORAL_TABLET | Freq: Every day | ORAL | Status: DC
  Administered 2012-04-04 – 2012-04-05 (×2): 25 mg via ORAL
  Filled 2012-04-04 (×4): qty 1

## 2012-04-04 MED ORDER — CLONIDINE HCL 0.1 MG PO TABS
0.1000 mg | ORAL_TABLET | Freq: Once | ORAL | Status: AC
  Administered 2012-04-04: 0.1 mg via ORAL

## 2012-04-04 MED ORDER — PRAZOSIN HCL 1 MG PO CAPS
1.0000 mg | ORAL_CAPSULE | Freq: Every evening | ORAL | Status: DC | PRN
  Administered 2012-04-04 – 2012-04-05 (×2): 1 mg via ORAL
  Filled 2012-04-04 (×8): qty 1

## 2012-04-04 NOTE — Progress Notes (Signed)
Psychoeducational Group Note  Date:  04/04/2012 Time: 2000  Group Topic/Focus:  Wrap-Up Group:   The focus of this group is to help patients review their daily goal of treatment and discuss progress on daily workbooks.  Participation Level:  Minimal  Participation Quality:  Attentive, Redirectable, Resistant and Sharing  Affect:  Anxious and Irritable  Cognitive:  Appropriate  Insight:  Good  Engagement in Group:  Good  Additional Comments:  Patient shared that he was able to speak with the "PT" for 45 minutes today about some very difficult things in his life and he was able to speak with a counselor for 30 minutes and that was therapeutic for him.  Vencent Hauschild, Newton Pigg 04/04/2012, 9:44 PM
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
## 2012-04-04 NOTE — Progress Notes (Signed)
[facility] Group Notes:  (Counselor/Nursing/MHT/Case Management/Adjunct)  04/04/2012 3:00 PM  Type of Therapy: Group Therapy   Participation Level: Minimal   Participation Quality: Limited  Affect: Depressed  Cognitive: oriented, alert   Insight: minimal  Engagement in Group: Limited  Modes of Intervention: Clarification, Education, Problem-solving, Socialization, Encouragement and Support   Summary of Progress/Problems: Pt participated in group by listening attentively and self disclosing.  Therapist addressed the concept of Recovery.  Therapist prompted patients to explain their understanding of their diagnosis and identify 2 symptoms of their disease.  Therapist facilitated a discussion about the social stigma surrounding mental health.  Therapist asked patients to explain how their families understand and deal with their disease.   Pt stated that he agreed with and had symptoms of Depression, hopeless and helpless.  Pt questioned the Diagnosis of OCD.  He stated the MD gave him that diagnosis based on the fact that he continually dwells in the past.  Pt stated he would be willing to try medication and would report to MD whether it was helping or not.  Therapist encouraged patients to develop and maintain strong support groups to enhance their recovery efforts.  Therapist offered support and encouragement.  Some progress noted.  Intervention effective.         Marni Griffon C 10/8//2013  3:00 PM

## 2012-04-04 NOTE — Progress Notes (Signed)
Encouraged pt to attend AA/NA meetings, pt states "I don't feel like I have anything in common with those people because my depression is what causes me to drink."

## 2012-04-04 NOTE — Progress Notes (Signed)
Patient ID: Frank [name], male   DOB: 1950/09/24, 61 y.o.   MRN: [id number] D- Patient has been in bed all day and got up to go to lunch.  His blood pressure was elevated this am and he says that he was not drinking prior to admit and doesn't understand why it is elevated.  BP reported to MD and medication given. A- Asked pt several times to fill out his self inventory.  He denies felling suicidal and he contracts for safety. R - Patient did not fill out his self inventory. His blood pressure recheck was in normal limits.  Talked with patient about his unwillingness to participate in program and his choice not to do something different to have a different outcome.  Patient says he has been hospitalized multiple times and nothing has changed.  Patient seems hopeless, helpless and resigned.

## 2012-04-05 DIAGNOSIS — F1994 Other psychoactive substance use, unspecified with psychoactive substance-induced mood disorder: Secondary | ICD-10-CM

## 2012-04-05 LAB — GLUCOSE, CAPILLARY: Glucose-Capillary: 106 mg/dL — ABNORMAL HIGH (ref 70–99)

## 2012-04-05 NOTE — Progress Notes (Signed)
Psychoeducational Group Note  Date:  04/05/2012 Time:  2000  Group Topic/Focus:  Wrap-Up Group:   The focus of this group is to help patients review their daily goal of treatment and discuss progress on daily workbooks.  Participation Level:  Minimal  Participation Quality:  Appropriate, Redirectable and Resistant  Affect:  Anxious and Flat  Cognitive:  Oriented  Insight:  Limited  Engagement in Group:  Limited  Additional Comments:  Patient shared that "[he] got through the day."  Averie Meiner, Newton Pigg 04/05/2012, 9:44 PM

## 2012-04-05 NOTE — Tx Team (Signed)
Interdisciplinary Treatment Plan Update (Adult)  Date:  04/05/2012  Time Reviewed:  10:01 AM   Progress in Treatment: Attending groups: Yes Participating in groups:  Yes Taking medication as prescribed:  Yes Tolerating medication: Yes Family/Significant othe contact made:  No, Frank [name] refuses to allow any contact with family Patient understands diagnosis: Yes Discussing patient identified problems/goals with staff:  Yes Medical problems stabilized or resolved: Yes Denies suicidal/homicidal ideation: No, reports he would not mind if he died Issues/concerns per patient self-inventory:  No  Other:  New problem(s) identified: None  Reason for Continuation of Hospitalization: Depression Anxiety Medication Stabillization Suicidal Ideation  Interventions implemented related to continuation of hospitalization:  Medication stabilization, safety checks q 15 mins, group attendance  Additional comments:  Estimated length of stay: 3-4 days  Discharge Plan: Frank [name] is still exploring discharge options since he cannot return to [facility]. Appointments for aftercare will be set in [facility] goal(s):  Review of initial/current patient goals per problem list:   1.  Goal(s): Decrease depressive symptoms to a rating of 4 or less  Met:  No  Target date: by discharge  As evidenced by: Frank [name] rates depression at 7  2.  Goal (s): Decrease anxiety symptoms to a rating of 4 or less  Met:  No  Target date: by discharge  As evidenced by: Frank [name] reports anxiety at 7 today  3.  Goal(s): Reduce potential for suicide/self-harm  Met: No  Target date: by discharge  As evidenced by: Frank [name] reports passive suicidal thoughts today - "If I were to die it would be okay, I can't get my life back anyway."  4.  Goal(s): Medication stabilization  Met:  No  Target date: by discharge  As evidenced by: Symptoms have not yet begun to be improved by medications  Attendees: Patient:     Family:      Physician:  Dr Orson Aloe, MD 04/05/2012 10:01 AM  Nursing:   Berneice Heinrich, RN 04/05/2012 10:01 AM  Case Manager:  Juline Patch, LCSW 04/05/2012 10:01 AM  Counselor:  Angus Palms, LCSW 04/05/2012 10:01 AM  Other:   Harold Barban, RN 04/05/2012 10:01 AM  Other:  Nestor Ramp, RN 04/05/2012 10:01 AM  Other:   Onnie Boer, RN Case Manager 04/05/2012 10:01 AM  Other:      Scribe for Treatment Team:   [name], 04/05/2012 10:01 AM

## 2012-04-05 NOTE — Progress Notes (Signed)
I agree with this note.  

## 2012-04-05 NOTE — Progress Notes (Signed)
Pt observed in the dayroom watching TV after group earlier.  He is interacting with his peers.  Pt appears flat/sad.  He reports he feels "ok".   Conversation with this Clinical research associate is minimal.  Discussed ordered medications with pt.  Encouraged pt to make his needs known to staff.  Pt voiced no needs/concerns at this time.  Pt to start minipress at HS to help with his nightmares.  Pt denies SI/HI/AV at this time.  Safety maintained with q15 minute checks.

## 2012-04-05 NOTE — Progress Notes (Signed)
Pt observed in the dayroom watching TV and waiting for group to begin.  Pt reports he has had a fair day.  His affect is still flat/depressed.  He is still unsure of his discharge plans.  He does not want his family involved.  Pt encouraged to make his needs known to staff.  Pt voices understanding.  Pt has no concerns/needs at this time.  Pt denies SI/HI/AV.  Safety maintained with q15 minute checks.

## 2012-04-05 NOTE — Progress Notes (Signed)
[facility] MD Progress Note  04/05/2012 4:14 PM  Diagnosis: Assessment:  AXIS I: Substance induced mood disorder, hx of substance abuse Depression  AXIS II: Deferred  AXIS III:  Past Medical History   Diagnosis  Date   .  Anxiety    .  Depression    .  Drug addiction    AXIS IV: economic problems, housing problems, occupational problems, other psychosocial or environmental problems and problems with primary support group  AXIS V: 31-40 impairment in reality testing   ADL's:  Intact  Sleep: Good  Appetite:  Good  Suicidal Ideation:  Denies intent, plan, access or means to harm self. Homicidal Ideation:  Patient denies intent, plan, access or means to harm others.  AEB (as evidenced by): Subjective: Pt was seen and the following discussed.  He notes that he is depressed and isn't feeling like he is going to get any better.  He states that he doesn't believe he has a drug problem.  Mental Status Examination/Evaluation: Objective:  Appearance: Disheveled  Eye Contact::  Fair  Speech:  Normal Rate  Volume:  Normal  Mood:  Depressed  Affect:  Depressed  Thought Process:  Coherent  Orientation:  Full  Thought Content:  WDL  Suicidal Thoughts:  No  Homicidal Thoughts:  No  Memory:  Immediate;   Fair Recent;   Fair Remote;   Fair  Judgement:  Impaired  Insight:  Lacking  Psychomotor Activity:  Normal  Concentration:  Fair  Recall:  Fair  Akathisia:  No  Handed:  Right  AIMS (if indicated):     Assets:  Desire for Improvement  Sleep:  Number of Hours: 5    Objective: This patient is taking no responsibility for his actions or current situation. He seems very entitled to receive full services and make no effort to change his situation.  He does not believe he has a problem and is poorly motivated to make any effort for change.  He is very rigid in his thinking.  Vital Signs:Blood pressure 100/63, pulse 91, temperature 97.1 F (36.2 C), temperature source Oral, resp. rate 16,  height 5\' 9"  (1.753 m), weight 86.637 kg (191 lb). Current Medications: Current Facility-Administered Medications  Medication Dose Route Frequency Provider Last Rate Last Dose  . acetaminophen (TYLENOL) tablet 650 mg  650 mg Oral Q6H PRN Sanjuana Kava, NP      . alum & mag hydroxide-simeth (MAALOX/MYLANTA) 200-200-20 MG/5ML suspension 30 mL  30 mL Oral Q4H PRN Sanjuana Kava, NP      . amitriptyline (ELAVIL) tablet 25 mg  25 mg Oral QHS Mike Craze, MD   25 mg at 04/04/12 2205  . fluvoxaMINE (LUVOX) tablet 25 mg  25 mg Oral QHS Mike Craze, MD   25 mg at 04/04/12 2205  . GRX ANALGESIC BALM OINT 1 application  1 application Apply externally QID Mike Craze, MD      . hydrOXYzine (ATARAX/VISTARIL) tablet 25 mg  25 mg Oral TID PRN Sanjuana Kava, NP   25 mg at 03/31/12 1715  . magnesium hydroxide (MILK OF MAGNESIA) suspension 30 mL  30 mL Oral Daily PRN Sanjuana Kava, NP      . meloxicam (MOBIC) tablet 7.5 mg  7.5 mg Oral Daily Mike Craze, MD   7.5 mg at 04/05/12 0837  . pantoprazole (PROTONIX) EC tablet 20 mg  20 mg Oral BID AC Mike Craze, MD   20 mg at 04/05/12 0647  .  prazosin (MINIPRESS) capsule 1 mg  1 mg Oral QHS,MR X 1 Mike Craze, MD   1 mg at 04/04/12 2205  . QUEtiapine (SEROQUEL) tablet 600 mg  600 mg Oral QHS Sanjuana Kava, NP   600 mg at 04/04/12 2204  . venlafaxine XR (EFFEXOR-XR) 24 hr capsule 75 mg  75 mg Oral Q breakfast Mike Craze, MD   75 mg at 04/05/12 4098   Followed by  . venlafaxine XR (EFFEXOR-XR) 24 hr capsule 37.5 mg  37.5 mg Oral Q breakfast Mike Craze, MD      . zolpidem (AMBIEN) tablet 10 mg  10 mg Oral QHS Mickie D. Pernell Dupre, PA   10 mg at 04/04/12 2205    Lab Results:  Results for orders placed during the hospital encounter of 03/31/12 (from the past 48 hour(s))  GLUCOSE, CAPILLARY     Status: Normal   Collection Time   04/03/12  4:54 PM      Component Value Range Comment   Glucose-Capillary 93  70 - 99 mg/dL   GLUCOSE, CAPILLARY      Status: Abnormal   Collection Time   04/03/12  8:24 PM      Component Value Range Comment   Glucose-Capillary 105 (*) 70 - 99 mg/dL   GLUCOSE, CAPILLARY     Status: Normal   Collection Time   04/04/12  6:13 AM      Component Value Range Comment   Glucose-Capillary 99  70 - 99 mg/dL   GLUCOSE, CAPILLARY     Status: Abnormal   Collection Time   04/04/12 11:48 AM      Component Value Range Comment   Glucose-Capillary 106 (*) 70 - 99 mg/dL    Comment 1 Documented in Chart      Comment 2 Notify RN     GLUCOSE, CAPILLARY     Status: Abnormal   Collection Time   04/04/12  8:53 PM      Component Value Range Comment   Glucose-Capillary 145 (*) 70 - 99 mg/dL   GLUCOSE, CAPILLARY     Status: Abnormal   Collection Time   04/05/12  6:31 AM      Component Value Range Comment   Glucose-Capillary 106 (*) 70 - 99 mg/dL     Physical Findings: AIMS: Facial and Oral Movements Muscles of Facial Expression: None, normal Lips and Perioral Area: None, normal Jaw: None, normal Tongue: None, normal,Extremity Movements Upper (arms, wrists, hands, fingers): None, normal Lower (legs, knees, ankles, toes): None, normal, Trunk Movements Neck, shoulders, hips: None, normal, Overall Severity Severity of abnormal movements (highest score from questions above): None, normal Incapacitation due to abnormal movements: None, normal Patient's awareness of abnormal movements (rate only patient's report): No Awareness, Dental Status Current problems with teeth and/or dentures?: No Does patient usually wear dentures?: No  CIWA:  CIWA-Ar Total: 3  COWS:  COWS Total Score: 2   Treatment Plan Summary: 1. Daily contact with patient to assess and evaluate symptoms and progress in treatment.  2. Medication management  3. The patient will deny suicidal ideations or homicidal ideations for 48 hours prior to discharge and have a depression and anxiety rating of 3 or less.The patient will also deny any auditory or visual  hallucinations or delusional thinking.  4. The patient will deny any symptoms of substance withdrawal at time of discharge.  Plan: 1. Continue amitriptyline 25mg  tablet daily at hs. 2. Continue fluvoxamine 25mt po qd at hs for OCD.  3. Continue Meloxicam 7.5 po qd for pain. 4. Continue protonix EC 20mg  BID for reflux. 5. Continue Seroquel 600mg  po at hs for depression. 6. Continue Venlafaxine XR 75mg  po qd for depression and anxiety. 7. Will d/c Ambien.  Patient is encouraged to sleep at hs rather than during the day. 8. Patient strongly encouraged to attend all groups. Rona Ravens. Leldon Steege PAC 04/05/2012, 4:14 PM

## 2012-04-05 NOTE — Progress Notes (Signed)
Psychoeducational Group Note  Date:  04/05/2012 Time:  1100  Group Topic/Focus:  Personal Choices and Values:   The focus of this group is to help patients assess and explore the importance of values in their lives, how their values affect their decisions, how they express their values and what opposes their expression.  Participation Level:  Active  Participation Quality:  Appropriate, Sharing and Supportive  Affect:  Appropriate  Cognitive:  Appropriate  Insight:  Good  Engagement in Group:  Good  Additional Comments:  none  Sheneika Walstad, Genia Plants 04/05/2012, 6:28 PM

## 2012-04-05 NOTE — Progress Notes (Signed)
  D) Patient resting quietly in room upon my assessment. Patient appears flat and sad this morning. Patient isolates to room at times. Patient states slept "fair," and  appetite is " good." Patient rates depression as  7 /10, patient rates hopeless feelings as 9 /10. Patient denies SI/HI, denies A/V hallucinations.   A) Patient offered support and encouragement, patient encouraged to discuss feelings/concerns with staff. Patient verbalized understanding. Patient monitored Q15 minutes for safety. Patient met with MD to discuss today's goals and plan of care.  R) Patient active on unit, attending some groups in day room and most meals in dining room.  Patient taking medications as ordered. Will continue to monitor.

## 2012-04-05 NOTE — Progress Notes (Signed)
Group Note  Date:  04/05/2012 Time:  1:15  Group Topic/Focus:  Emotion Regulation  Participation Level:  Did Not Attend  Frank [name] 04/05/2012, 2:38 PM

## 2012-04-05 NOTE — Progress Notes (Signed)
Patient seen during discharge planning group.  He continues to endorse all symptoms at seven.  Patient denies SI/HI.  Patient shared that he is very down today.

## 2012-04-05 NOTE — Progress Notes (Signed)
Psychoeducational Group Note  Date:  04/05/2012 Time: 1000  Group Topic/Focus:  Personal Development- Wisdom Card Activity  Participation Level: Did Not Attend  Participation Quality:  Not Applicable  Affect:  Not Applicable  Cognitive:  Not Applicable  Insight:  Not Applicable  Engagement in Group: Not Applicable  Additional Comments:  Pt did not attend group.  Karleen Hampshire Brittini 04/05/2012, 1:35 PM

## 2012-04-06 MED ORDER — FLUVOXAMINE MALEATE 50 MG PO TABS
50.0000 mg | ORAL_TABLET | Freq: Every day | ORAL | Status: DC
  Administered 2012-04-06 – 2012-04-09 (×4): 50 mg via ORAL
  Filled 2012-04-06 (×6): qty 1

## 2012-04-06 MED ORDER — PRAZOSIN HCL 1 MG PO CAPS
2.0000 mg | ORAL_CAPSULE | Freq: Every evening | ORAL | Status: DC | PRN
  Administered 2012-04-06 – 2012-04-10 (×6): 2 mg via ORAL
  Filled 2012-04-06 (×12): qty 2

## 2012-04-06 MED ORDER — AMITRIPTYLINE HCL 50 MG PO TABS
50.0000 mg | ORAL_TABLET | Freq: Every day | ORAL | Status: DC
  Administered 2012-04-06 – 2012-04-09 (×4): 50 mg via ORAL
  Filled 2012-04-06 (×2): qty 1
  Filled 2012-04-06: qty 2
  Filled 2012-04-06 (×4): qty 1

## 2012-04-06 MED ORDER — QUETIAPINE FUMARATE 400 MG PO TABS
800.0000 mg | ORAL_TABLET | Freq: Every day | ORAL | Status: DC
  Administered 2012-04-06 – 2012-04-10 (×5): 800 mg via ORAL
  Filled 2012-04-06 (×5): qty 2
  Filled 2012-04-06: qty 28
  Filled 2012-04-06 (×2): qty 2

## 2012-04-06 NOTE — Progress Notes (Signed)
        [facility] Group Notes: (Counselor/Nursing/MHT/Case Management/Adjunct) 04/06/2012   @1 :15pm [facility] in [place]  Type of Therapy:  Group Therapy  Participation Level:  Good  Participation Quality:  Good  Affect:  Flat  Cognitive:  Appropriate  Insight:  Poor  Engagement in Group:  Good  Engagement in Therapy:  Good  Modes of Intervention:  Support and Exploration  Summary of Progress/Problems: Frank [name] participated with speaker from [employer] [facility] [place] and expressed interest in programs [facility] offers. He asked appropriate questions about the speaker's story, and about services offered. Frank [name] shared briefly that his strengths lie in being well educated and a Publishing copy, but "that was all in the past." He shared feelings of guilt for "ruining it all."   [name] 04/06/2012  2:22 PM

## 2012-04-06 NOTE — Progress Notes (Signed)
D: Patient in dayroom interacting with peers on approach.  Patient states he has been battling depression for 2 years and he has had some pretty important losses in his life.  Patient does not elaborate.  Patient states he has chronic back pain that bothers him.  Patient rates depression and anxiety 6/10.  Patient denies SI/HI and denies AVH.   A: Staff to monitor Q 15 mins for safety.  Encouragement and support offered.  Scheduled medications administered per orders. R: Patient remains safe on the unit.  Patient attended [employer] and states "it was fun." Patient taking administered medications.  Patient calm, cooperative and interacting on the unit.

## 2012-04-06 NOTE — Tx Team (Signed)
Patient seen during during d/c planning group and or treatment team.  He reported during treatment team that he is upset that he had been taken of Ativan without it being discussed with him.  He reports not sleeping, being irritated, restless and twitching.  Patient shared that he is not doing well today as a result of sleeping very poorly.  He denies SI/HI but continues to endorse depression and anxiety at six/seven.

## 2012-04-06 NOTE — Progress Notes (Signed)
[facility] MD Progress Note  04/06/2012 2:42 PM S: "I am a bundle of nerves (as pt gyrates and vibrates at a frequency of .8 to 1.2 cycles per second throughout his legs, abdomen, and arms).  I didn't sleep at all last night.  Some one cancelled my Ambien and didn't tell me.  I counted 20 times that the staff came in to check on me. ... 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  O: Pt still a bundle of nerves, gyrating and vibrating as above.  Spoke with reviewer who will submit to [facility].  Pt challenged to really focus on some hobby and plan out something that will capture his thinking away from the current focus on what all has gone wrong in his life.  He was able to see the logic of that and was trying to come up with something.  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  Explained to pt the effect that benzodiazepines have on the brain and the fact that Ambien is only approved for use in 28 day episodes and no longer.  ROS: Neuro: no headaches, ataxia, weakness GI: no N/V/D/cramps/constipation MS: no weakness, muscle cramps, aches.  Plan: Push Luvox, Elavil, and Minipress for increased sedation now that his Ambien has been taken away.  Push that he attend 12 Step meetings on [address].  Diagnosis:   Axis I: Bipolar, Depressed, Obsessive Compulsive Disorder and Substance Abuse Axis II: Deferred Axis III:  Past Medical History  Diagnosis Date  . Anxiety   . Depression   . Drug addiction    Axis IV: other psychosocial or environmental problems Axis V: 51-60 moderate symptoms  ADL's:  Intact  Sleep: Fair, says that he did not sleep at all last night, but staff seem to think he slept 4.5 hours.  Appetite:  Fair  Suicidal Ideation:  Denies any over suicidal thoughts today, but has an overwhelming sense that he wants to be not alive. Homicidal Ideation:  Pt  denies any thoughts, plans, intent of homicide  AEB (as evidenced by):per pt report  Mental Status Examination/Evaluation: Objective:  Appearance: Casual  Eye Contact::  Good  Speech:  Clear and Coherent  Volume:  Normal  Mood:  Anxious, Depressed, Hopeless, Irritable and Worthless  Affect:  gyrating in his legs and abdomen and arms with anxiety  Thought Process:  Coherent  Orientation:  Full  Thought Content:  WDL  Suicidal Thoughts:  Yes.  without intent/plan  Homicidal Thoughts:  No  Memory:  Immediate;   Fair Recent;   Fair Remote;   Fair  Judgement:  Impaired  Insight:  Fair  Psychomotor Activity:  Normal  Concentration:  Fair  Recall:  Fair  Akathisia:  No  Handed:  Right  AIMS (if indicated):     Assets:  Communication Skills Desire for Improvement  Sleep:  Number of Hours: 4.5    Vital Signs:Blood pressure 137/92, pulse 92, temperature 97.6 F (36.4 C), temperature source Oral, resp. rate 18, height 5\' 9"  (1.753 m), weight 86.637 kg (191 lb). Current Medications: Current Facility-Administered Medications  Medication Dose Route Frequency Provider Last Rate Last Dose  . acetaminophen (TYLENOL) tablet 650 mg  650 mg Oral Q6H PRN Sanjuana Kava, NP      . alum & mag hydroxide-simeth (MAALOX/MYLANTA) 200-200-20 MG/5ML suspension 30 mL  30 mL  Oral Q4H PRN Sanjuana Kava, NP      . amitriptyline (ELAVIL) tablet 50 mg  50 mg Oral QHS Mike Craze, MD      . fluvoxaMINE (LUVOX) tablet 50 mg  50 mg Oral QHS Mike Craze, MD      . Oletta Cohn ANALGESIC BALM OINT 1 application  1 application Apply externally QID Mike Craze, MD      . hydrOXYzine (ATARAX/VISTARIL) tablet 25 mg  25 mg Oral TID PRN Sanjuana Kava, NP   25 mg at 03/31/12 1715  . magnesium hydroxide (MILK OF MAGNESIA) suspension 30 mL  30 mL Oral Daily PRN Sanjuana Kava, NP      . meloxicam (MOBIC) tablet 7.5 mg  7.5 mg Oral Daily Mike Craze, MD   7.5 mg at 04/06/12 0747  . pantoprazole (PROTONIX) EC tablet 20 mg   20 mg Oral BID AC Mike Craze, MD   20 mg at 04/06/12 2130  . prazosin (MINIPRESS) capsule 2 mg  2 mg Oral QHS,MR X 1 Mike Craze, MD      . QUEtiapine (SEROQUEL) tablet 800 mg  800 mg Oral QHS Mike Craze, MD      . venlafaxine XR (EFFEXOR-XR) 24 hr capsule 75 mg  75 mg Oral Q breakfast Mike Craze, MD   75 mg at 04/06/12 0747   Followed by  . venlafaxine XR (EFFEXOR-XR) 24 hr capsule 37.5 mg  37.5 mg Oral Q breakfast Mike Craze, MD      . DISCONTD: amitriptyline (ELAVIL) tablet 25 mg  25 mg Oral QHS Mike Craze, MD   25 mg at 04/05/12 2151  . DISCONTD: fluvoxaMINE (LUVOX) tablet 25 mg  25 mg Oral QHS Mike Craze, MD   25 mg at 04/05/12 2151  . DISCONTD: prazosin (MINIPRESS) capsule 1 mg  1 mg Oral QHS,MR X 1 Mike Craze, MD   1 mg at 04/05/12 2151  . DISCONTD: QUEtiapine (SEROQUEL) tablet 600 mg  600 mg Oral QHS Sanjuana Kava, NP   600 mg at 04/05/12 2151  . DISCONTD: zolpidem (AMBIEN) tablet 10 mg  10 mg Oral QHS Mickie D. Pernell Dupre, PA   10 mg at 04/04/12 2205    Lab Results:  Results for orders placed during the hospital encounter of 03/31/12 (from the past 48 hour(s))  GLUCOSE, CAPILLARY     Status: Abnormal   Collection Time   04/04/12  8:53 PM      Component Value Range Comment   Glucose-Capillary 145 (*) 70 - 99 mg/dL   GLUCOSE, CAPILLARY     Status: Abnormal   Collection Time   04/05/12  6:31 AM      Component Value Range Comment   Glucose-Capillary 106 (*) 70 - 99 mg/dL     Physical Findings: AIMS: Facial and Oral Movements Muscles of Facial Expression: None, normal Lips and Perioral Area: None, normal Jaw: None, normal Tongue: None, normal,Extremity Movements Upper (arms, wrists, hands, fingers): None, normal Lower (legs, knees, ankles, toes): None, normal, Trunk Movements Neck, shoulders, hips: None, normal, Overall Severity Severity of abnormal movements (highest score from questions above): None, normal Incapacitation due to abnormal movements:  None, normal Patient's awareness of abnormal movements (rate only patient's report): No Awareness, Dental Status Current problems with teeth and/or dentures?: No Does patient usually wear dentures?: No  CIWA:  CIWA-Ar Total: 3  COWS:  COWS Total Score: 2   Treatment Plan  Summary: Daily contact with patient to assess and evaluate symptoms and progress in treatment Medication management  Darleth Eustache 04/06/2012, 2:42 PM

## 2012-04-06 NOTE — Progress Notes (Signed)
D: Patient denies SI. The patient has a depressed mood and affect. The patient refused to rate his depression, stating that he was "too tired to be able to tell." The patient did rate his hopelessness an 8 out of 10 (1 low/10 high). The patient reports sleeping poorly and states that his appetite and energy level are normal. The patient repeatedly requested ambien when meeting with the treatment team this morning. The patient became irritable when new medications were suggested to replace the ambien.  A: Patient given emotional support from RN. Patient encouraged to come to staff with concerns and/or questions. Patient's medication routine continued. Patient's orders and plan of care reviewed. Patient remains somewhat irritable and is still requesting ambien for sleep.  R: Patient remains cooperative and somewhat irritable. Will continue to monitor patient q15 minutes for safety.

## 2012-04-06 NOTE — Tx Team (Signed)
Interdisciplinary Treatment Plan Update (Adult)  Date:  04/06/2012  Time Reviewed:  10:16 AM   Progress in Treatment: Attending groups:   Yes   Participating in groups:  Yes Taking medication as prescribed:  Yes Tolerating medication:  Yes Family/Significant othe contact made:  Patient understands diagnosis:  Yes Discussing patient identified problems/goals with staff: Yes Medical problems stabilized or resolved: Yes Denies suicidal/homicidal ideation:Yes Issues/concerns per patient self-inventory:  Other:  New problem(s) identified:  Reason for Continuation of Hospitalization: Anxiety Depression Medication stabilization  Interventions implemented related to continuation of hospitalization:  Medication Management; safety checks q 15 mins  Additional comments:  Estimated length of stay: 3-4 days  Discharge Plan: Home with outpatient follow up  New goal(s):  Review of initial/current patient goals per problem list:    1.  Goal(s): Eliminate SI/other thoughts of self harm   Met:  Yes  Target date: d/c  As evidenced by:  Patient will no longer endorse SI/other thought self harm    2.  Goal (s): Reduce depression/anxiety (rated at six/seven toay)   Met:  No  Target date: d/c  As evidenced by: Patient currently rating symptoms at four or below    3.  Goal(s): .stabilize on meds   Met:  No  Target date: d/c  As evidenced by: Patient reports being stabilized on medications - less symptomatic    4.  Goal(s): Refer for outpatient follow up   Met:  No  Target date: d/c  As evidenced by: Outpatient follow up will be scheduled  Attendees: Patient:  Frank [name] 04/06/2012 10:16 AM  Nursing:  Nestor Ramp, RN 04/06/2012 10:16 AM  Physician:  Orson Aloe, MD 04/06/2012 10:16 AM   Nursing:   Charlyne Mom, RN 04/06/2012 10:16 AM   CaseManager:  Juline Patch, LCSW 04/06/2012 10:16 AM   Counselor:  Angus Palms, LCSW 04/06/2012 10:16 AM

## 2012-04-07 NOTE — Progress Notes (Signed)
[facility] MD Progress Note  04/07/2012 12:38 PM  Diagnosis:   AXIS I: dep d/o nos  Depression  AXIS II: Deferred  AXIS III:  Past Medical History   Diagnosis  Date   .  Anxiety    .  Depression    .  Drug addiction    AXIS IV: economic problems, housing problems, occupational problems, other psychosocial or environmental problems and problems with primary support group  AXIS V: 31-40 impairment in reality testing  ADL's:  Intact  Sleep: Good The patient reports that he slept well, but is "exhausted" and slept through groups this morning.  He is in bed during interview.  Appetite:  Fair  Suicidal Ideation:  The patient denies SI, intent, plan, means or access. Homicidal Ideation:  The patient denies HI, intent, plan, means or access.  AEB (as evidenced by): Subjective:  The patient was in bed at 10:00 this am and reports he could not attend the AM group because he was so exhausted even though he reports sleeping well last night. Mental Status Examination/Evaluation: Objective:  Appearance: Disheveled  Eye Contact::  Fair  Speech:  Clear and Coherent  Volume:  Normal  Mood:  Depressed  Affect:  Congruent  Thought Process:  Coherent  Orientation:  Full  Thought Content:  WDL  Suicidal Thoughts:  No  Homicidal Thoughts:  No  Memory:  Immediate;   Fair Recent;   Fair Remote;   Fair  Judgement:  Poor  Insight:  Absent  Psychomotor Activity:  Normal  Concentration:  Fair  Recall:  Fair  Akathisia:  No  Handed:  Right  AIMS (if indicated):     Assets:  Communication Skills  Sleep:  Number of Hours: 5.5    Vital Signs:Blood pressure 89/55, pulse 103, temperature 97.6 F (36.4 C), temperature source Oral, resp. rate 16, height 5\' 9"  (1.753 m), weight 86.637 kg (191 lb). Current Medications: Current Facility-Administered Medications  Medication Dose Route Frequency Provider Last Rate Last Dose  . acetaminophen (TYLENOL) tablet 650 mg  650 mg Oral Q6H PRN Sanjuana Kava, NP       . alum & mag hydroxide-simeth (MAALOX/MYLANTA) 200-200-20 MG/5ML suspension 30 mL  30 mL Oral Q4H PRN Sanjuana Kava, NP      . amitriptyline (ELAVIL) tablet 50 mg  50 mg Oral QHS Mike Craze, MD   50 mg at 04/06/12 2150  . fluvoxaMINE (LUVOX) tablet 50 mg  50 mg Oral QHS Mike Craze, MD   50 mg at 04/06/12 2150  . GRX ANALGESIC BALM OINT 1 application  1 application Apply externally QID Mike Craze, MD      . hydrOXYzine (ATARAX/VISTARIL) tablet 25 mg  25 mg Oral TID PRN Sanjuana Kava, NP   25 mg at 03/31/12 1715  . magnesium hydroxide (MILK OF MAGNESIA) suspension 30 mL  30 mL Oral Daily PRN Sanjuana Kava, NP      . meloxicam (MOBIC) tablet 7.5 mg  7.5 mg Oral Daily Mike Craze, MD   7.5 mg at 04/07/12 0800  . pantoprazole (PROTONIX) EC tablet 20 mg  20 mg Oral BID AC Mike Craze, MD   20 mg at 04/07/12 0658  . prazosin (MINIPRESS) capsule 2 mg  2 mg Oral QHS,MR X 1 Mike Craze, MD   2 mg at 04/06/12 2150  . QUEtiapine (SEROQUEL) tablet 800 mg  800 mg Oral QHS Mike Craze, MD   800 mg at  04/06/12 2150  . venlafaxine XR (EFFEXOR-XR) 24 hr capsule 37.5 mg  37.5 mg Oral Q breakfast Mike Craze, MD   37.5 mg at 04/07/12 0800  . DISCONTD: amitriptyline (ELAVIL) tablet 25 mg  25 mg Oral QHS Mike Craze, MD   25 mg at 04/05/12 2151  . DISCONTD: fluvoxaMINE (LUVOX) tablet 25 mg  25 mg Oral QHS Mike Craze, MD   25 mg at 04/05/12 2151  . DISCONTD: prazosin (MINIPRESS) capsule 1 mg  1 mg Oral QHS,MR X 1 Mike Craze, MD   1 mg at 04/05/12 2151  . DISCONTD: QUEtiapine (SEROQUEL) tablet 600 mg  600 mg Oral QHS Sanjuana Kava, NP   600 mg at 04/05/12 2151   Objective: The patient appears to be making minimal effort at recovery. Lab Results: No results found for this or any previous visit (from the past 48 hour(s)).  Physical Findings: AIMS: Facial and Oral Movements Muscles of Facial Expression: None, normal Lips and Perioral Area: None, normal Jaw: None, normal Tongue:  None, normal,Extremity Movements Upper (arms, wrists, hands, fingers): None, normal Lower (legs, knees, ankles, toes): None, normal, Trunk Movements Neck, shoulders, hips: None, normal, Overall Severity Severity of abnormal movements (highest score from questions above): None, normal Incapacitation due to abnormal movements: None, normal Patient's awareness of abnormal movements (rate only patient's report): No Awareness, Dental Status Current problems with teeth and/or dentures?: No Does patient usually wear dentures?: No  CIWA:  CIWA-Ar Total: 3  COWS:  COWS Total Score: 2   Assessment: This patient appears to be at a baseline. Treatment Plan Summary:  1. Daily contact with patient to assess and evaluate symptoms and progress in treatment.  2. Medication management  3. The patient will deny suicidal ideations or homicidal ideations for 48 hours prior to discharge and have a depression and anxiety rating of 3 or less.The patient will also deny any auditory or visual hallucinations or delusional thinking.  4. The patient will deny any symptoms of substance withdrawal at time of discharge.  Plan:  1. Continue amitriptyline 25mg  tablet daily at hs.  2. Continue fluvoxamine 25mt po qd at hs for OCD.  3. Continue Meloxicam 7.5 po qd for pain.  4. Continue protonix EC 20mg  BID for reflux.  5. Continue Seroquel 600mg  po at hs for depression.  6. Continue Venlafaxine XR 75mg  po qd for depression and anxiety.  7. Patient is encouraged to sleep at hs rather than during the day.  8. Patient strongly encouraged to attend all groups. 9. Consider discharge if no further improvement is noted. Rona Ravens. Rally Ouch PAC 04/07/2012, 12:38 PM

## 2012-04-07 NOTE — Progress Notes (Signed)
[facility] Group Notes:  (Counselor/Nursing/MHT/Case Management/Adjunct)  04/07/2012 11:39 PM  Type of Therapy:  Psychoeducational Skills  Participation Level:  Minimal  Participation Quality:  Attentive  Affect:  Appropriate  Cognitive:  Appropriate  Insight:  Good  Engagement in Group:  Limited  Engagement in Therapy:  Limited  Modes of Intervention:  Education  Summary of Progress/Problems: The patient mentioned in group this evening that he felt very "groggy" in the morning and that he didn't feel well until nearly lunch time. He did not state a goal for tomorrow.    Hazle Coca S 04/07/2012, 11:39 PM

## 2012-04-07 NOTE — Progress Notes (Signed)
Psychoeducational Group Note  Date:  04/07/2012 Time:  1100  Group Topic/Focus:  Relapse Prevention Planning:   The focus of this group is to define relapse and discuss the need for planning to combat relapse.  Participation Level:  Did Not Attend  Participation Quality:    Affect:    Cognitive:    Insight:    Engagement in Group:    Additional Comments:  Pt was sleeping.  Isla Pence M 04/07/2012, 2:11 PM

## 2012-04-07 NOTE — Progress Notes (Signed)
Pt. Was lying in his bed when nurse came on shift. Denies SI or HI and contracts for safety. Pt. Laughed and stated he has had a pretty good day when asked. Presently is in rap up group. Remains in a safe environment with Q15 minute checks . Pt is very cooperative and appears in much better spirits compared to earlier this week.

## 2012-04-07 NOTE — Progress Notes (Signed)
[facility] Group Notes: (Counselor/Nursing/MHT/Case Management/Adjunct) 04/07/2012   @1 :15 -2:30pm Preventing Relapse  Type of Therapy:  Group Therapy  Participation Level:  None  Participation Quality: Inattentive    Affect:  Flat  Cognitive:  Appropriate  Insight:  None  Engagement in Group: None  Engagement in Therapy:  None  Modes of Intervention:  Support and Exploration  Summary of Progress/Problems: Frank [name] was not engaged in group process. He sat with his hand over his face for much of the group and did not speak.    Angus Palms, LCSW 04/07/2012 3:16 PM

## 2012-04-08 NOTE — Progress Notes (Signed)
Patient ID: Frank [name], male   DOB: January 14, 1951, 61 y.o.   MRN: [id number] [facility] MD Progress Note  04/08/2012 8:42 PM  Diagnosis:   AXIS I: dep d/o nos  Depression  AXIS II: Deferred  AXIS III:  Past Medical History   Diagnosis  Date   .  Anxiety    .  Depression    .  Drug addiction    AXIS IV: economic problems, housing problems, occupational problems, other psychosocial or environmental problems and problems with primary support group  AXIS V: 31-40 impairment in reality testing  ADL's:  Intact  Sleep: poor per pt  Appetite:  Fair  Suicidal Ideation:  The patient denies SI, intent, plan, means or access. Homicidal Ideation:  The patient denies HI, intent, plan, means or access.  AEB (as evidenced by): Subjective:  The patient was in bed at 11:00 this am and reports he could not attend the AM group because per pt he has poor sleep at night.. Mental Status Examination/Evaluation: Objective:  Appearance: Disheveled  Eye Contact::  Fair  Speech:  Clear and Coherent  Volume:  Normal  Mood:  Depressed  Affect:  Congruent  Thought Process:  Coherent  Orientation:  Full  Thought Content:  WDL  Suicidal Thoughts:  No  Homicidal Thoughts:  No  Memory:  Immediate;   Fair Recent;   Fair Remote;   Fair  Judgement:  Poor  Insight:  Absent  Psychomotor Activity:  Normal  Concentration:  Fair  Recall:  Fair  Akathisia:  No  Handed:  Right  AIMS (if indicated):     Assets:  Communication Skills  Sleep:  Number of Hours: 5.25    Vital Signs:Blood pressure 91/62, pulse 108, temperature 98.1 F (36.7 C), temperature source Oral, resp. rate 20, height 5\' 9"  (1.753 m), weight 86.637 kg (191 lb). Current Medications: Current Facility-Administered Medications  Medication Dose Route Frequency Provider Last Rate Last Dose  . acetaminophen (TYLENOL) tablet 650 mg  650 mg Oral Q6H PRN Sanjuana Kava, NP      . alum & mag hydroxide-simeth (MAALOX/MYLANTA) 200-200-20 MG/5ML suspension  30 mL  30 mL Oral Q4H PRN Sanjuana Kava, NP      . amitriptyline (ELAVIL) tablet 50 mg  50 mg Oral QHS Mike Craze, MD   50 mg at 04/07/12 2144  . fluvoxaMINE (LUVOX) tablet 50 mg  50 mg Oral QHS Mike Craze, MD   50 mg at 04/07/12 2144  . GRX ANALGESIC BALM OINT 1 application  1 application Apply externally QID Mike Craze, MD      . hydrOXYzine (ATARAX/VISTARIL) tablet 25 mg  25 mg Oral TID PRN Sanjuana Kava, NP   25 mg at 03/31/12 1715  . magnesium hydroxide (MILK OF MAGNESIA) suspension 30 mL  30 mL Oral Daily PRN Sanjuana Kava, NP      . meloxicam (MOBIC) tablet 7.5 mg  7.5 mg Oral Daily Mike Craze, MD   7.5 mg at 04/08/12 0930  . pantoprazole (PROTONIX) EC tablet 20 mg  20 mg Oral BID AC Mike Craze, MD   20 mg at 04/08/12 1811  . prazosin (MINIPRESS) capsule 2 mg  2 mg Oral QHS,MR X 1 Mike Craze, MD   2 mg at 04/07/12 2144  . QUEtiapine (SEROQUEL) tablet 800 mg  800 mg Oral QHS Mike Craze, MD   800 mg at 04/07/12 2144  . venlafaxine XR (EFFEXOR-XR) 24 hr  capsule 37.5 mg  37.5 mg Oral Q breakfast Mike Craze, MD   37.5 mg at 04/08/12 0930   Objective: The patient appears to be making minimal effort at recovery. Lab Results: No results found for this or any previous visit (from the past 48 hour(s)).  Physical Findings: AIMS: Facial and Oral Movements Muscles of Facial Expression: None, normal Lips and Perioral Area: None, normal Jaw: None, normal Tongue: None, normal,Extremity Movements Upper (arms, wrists, hands, fingers): None, normal Lower (legs, knees, ankles, toes): None, normal, Trunk Movements Neck, shoulders, hips: None, normal, Overall Severity Severity of abnormal movements (highest score from questions above): None, normal Incapacitation due to abnormal movements: None, normal Patient's awareness of abnormal movements (rate only patient's report): No Awareness, Dental Status Current problems with teeth and/or dentures?: No Does patient usually  wear dentures?: No  CIWA:  CIWA-Ar Total: 3  COWS:  COWS Total Score: 2   Assessment: This patient appears to be at a baseline. Treatment Plan Summary:  1. Daily contact with patient to assess and evaluate symptoms and progress in treatment.  2. Medication management  3. The patient will deny suicidal ideations or homicidal ideations for 48 hours prior to discharge and have a depression and anxiety rating of 3 or less.The patient will also deny any auditory or visual hallucinations or delusional thinking.  4. The patient will deny any symptoms of substance withdrawal at time of discharge.  Plan:  1. Continue current med  encourged to stay out of bed and stay active to sleep better at night 04/08/2012, 8:42 PM

## 2012-04-08 NOTE — Progress Notes (Signed)
Psychoeducational Group Note  Date:  04/08/2012 Time:  1015  Group Topic/Focus:  Identifying Needs:   The focus of this group is to help patients identify their personal needs that have been historically problematic and identify healthy behaviors to address their needs.  Participation Level: Did Not Attend  Participation Quality:  Not Applicable  Affect:  Not Applicable  Cognitive:  Not Applicable  Insight:  Not Applicable  Engagement in Group: Not Applicable  Additional Comments:   [name] 04/08/2012, 1:10 PM

## 2012-04-08 NOTE — Progress Notes (Signed)
Patient ID: BRUCE WINDMILLER, male   DOB: 02-21-1951, 61 y.o.   MRN: [id number]  [facility] Group Notes:  (Counselor/Nursing/MHT/Case Management/Adjunct)  04/08/2012 1:15 PM  Type of Therapy:  Group Therapy  Participation Level:  None  Participation Quality:  Inattentive and Resistant  Affect:  Anxious, Depressed, Flat and Irritable  Cognitive:  Appropriate  Insight:  None  Engagement in Group:  None  Engagement in Therapy:  None  Modes of Intervention:  Clarification, Problem-solving, Role-play, Socialization and Support  Summary of Progress/Problems:  Pt participated in counseling group on positive and negative emotions and how to utilize coping skills to progress from negative to positive emotional states. Pt was physically present during group but chose not to share or complete the activity during group.  When asked if he would like to share pt stated he is "thinking" and appeared to get lost in his own thoughts throughout the group.  Client did not maintain eye contact with group members or counselors.  Debarah Crape 04/08/2012. 2:47 PM

## 2012-04-08 NOTE — Progress Notes (Signed)
Frank [name] has spent most of the day in his bed...sleeeping. He appears exhausted ( his skin is dark, he avoids making eye contact with this Clinical research associate and he looks down and admits " I am exhausted today". He does come to the med window this morning for his daily meds...but returned to his bed shortly thereafter and has remained there ver since.  A He is medicated per his MD and has not requested any prn meds thus far. He has been encouraged to completed his self inventory but has not done so, as of yet.  R Safety is in place and pOC cont PD RN [facility]

## 2012-04-08 NOTE — Progress Notes (Signed)
Patient ID: Frank [name], male   DOB: 1950-07-06, 61 y.o.   MRN: [id number]  Pt did not attend and aftercare planning group. Pt. accepted information on suicide prevention, warning signs to look for with suicide and crisis line numbers to use. The pt. agreed to call crisis line numbers if having warning signs or having thoughts of suicide. Pt received daily workbook.

## 2012-04-08 NOTE — Progress Notes (Signed)
D: Pt in bed resting with eyes closed. Respirations even and unlabored. Pt appears to be in no signs of distress at this time. A: Q15min checks remains for this pt. R: Pt remains safe at this time.   

## 2012-04-09 NOTE — Progress Notes (Signed)
Patient ID: Frank [name], male   DOB: [birth year], 61 y.o.   MRN: [id number] [facility] Group Notes:  (Counselor/Nursing/MHT/Case Management/Adjunct)  04/09/2012 1:15 PM  Type of Therapy:  Group Therapy, Dance/Movement Therapy   Participation Level:  Minimal  Participation Quality:  Appropriate and Drowsy  Affect:  Depressed  Cognitive:  Appropriate  Insight:  Limited  Engagement in Group:  Limited  Engagement in Therapy:  Limited  Modes of Intervention:  Clarification, Problem-solving, Role-play, Socialization and Support  Summary of Progress/Problems:    Pt participated in group therapy focused on identifying supports, and distinguishing between healthy and unhealthy supports.  Pt participated in activity to identify traits of healthy and unhealthy supports and engaged in group processing.  Pt was more alert and active in group than in previous groups.  Pt shared and was able to maintain eye contact with counselor for short periods of time.   Debarah Crape 04/09/2012. 3:02 PM

## 2012-04-09 NOTE — Progress Notes (Signed)
Patient ID: Frank [name], male   DOB: [birth year], 61 y.o.   MRN: [id number] Pt.did not attend aftercare planning group.  Pt received information on suicide warning signs and prevention.

## 2012-04-09 NOTE — Progress Notes (Signed)
Spoke with pt 1:1 who is flat, depressed in affect with congruent mood. He spoke of his stressors as indicated in chart. Asked pt if wife's and children's complaints were valid and pt stated, "yes they are true and they have every right to be disappointed in me." Encouraged pt that time might help children resolve conflict with him. Given emotional support. Pt appears hopeless. Required both doses of minipress and BP elevated tonight as it was last night. Offered AA but refused and attended 500 hall evening wrap up instead. Denies SI/HI/AVH and remains safe. Lawrence Marseilles

## 2012-04-09 NOTE — Progress Notes (Signed)
[facility] Group Notes:  (Counselor/Nursing/MHT/Case Management/Adjunct)  04/09/2012 12:40 AM  Type of Therapy:  Psychoeducational Skills  Participation Level:  Minimal  Participation Quality:  Inattentive  Affect:  Depressed  Cognitive:  Appropriate  Insight:  Limited  Engagement in Group:  Limited  Engagement in Therapy:  Limited  Modes of Intervention:  Education  Summary of Progress/Problems: The patient had very poor eye contact in group and had very little to share. He would only say that he had an all right day without offering any details. He did not have a goal for tomorrow.    Lorenia Hoston S 04/09/2012, 12:40 AM

## 2012-04-09 NOTE — Progress Notes (Signed)
Patient ID: Frank [name], male   DOB: April 09, 1951, 61 y.o.   MRN: [id number] Patient ID: Frank [name], male   DOB: 10-05-1950, 61 y.o.   MRN: [id number] [facility] MD Progress Note  04/09/2012 8:44 PM  Diagnosis:   AXIS I: dep d/o nos  Depression  AXIS II: Deferred  AXIS III:  Past Medical History   Diagnosis  Date   .  Anxiety    .  Depression    .  Drug addiction    AXIS IV: economic problems, housing problems, occupational problems, other psychosocial or environmental problems and problems with primary support group  AXIS V: 31-40 impairment in reality testing  ADL's:  Intact  Sleep: poor per pt  Appetite:  Fair  Suicidal Ideation:  The patient denies SI, intent, plan, means or access. Homicidal Ideation:  The patient denies HI, intent, plan, means or access.  AEB (as evidenced by): Subjective:  More active and attended groups today. More calmer today.   Mental Status Examination/Evaluation: Objective:  Appearance: Disheveled  Eye Contact::  Fair  Speech:  Clear and Coherent  Volume:  Normal  Mood:  Depressed  Affect:  Congruent  Thought Process:  Coherent  Orientation:  Full  Thought Content:  WDL  Suicidal Thoughts:  No  Homicidal Thoughts:  No  Memory:  Immediate;   Fair Recent;   Fair Remote;   Fair  Judgement:  Poor  Insight:  Absent  Psychomotor Activity:  Normal  Concentration:  Fair  Recall:  Fair  Akathisia:  No  Handed:  Right  AIMS (if indicated):     Assets:  Communication Skills  Sleep:  Number of Hours: 5.25    Vital Signs:Blood pressure 110/76, pulse 90, temperature 98.1 F (36.7 C), temperature source Oral, resp. rate 18, height 5\' 9"  (1.753 m), weight 86.637 kg (191 lb). Current Medications: Current Facility-Administered Medications  Medication Dose Route Frequency Provider Last Rate Last Dose  . acetaminophen (TYLENOL) tablet 650 mg  650 mg Oral Q6H PRN Sanjuana Kava, NP      . alum & mag hydroxide-simeth (MAALOX/MYLANTA) 200-200-20 MG/5ML  suspension 30 mL  30 mL Oral Q4H PRN Sanjuana Kava, NP      . amitriptyline (ELAVIL) tablet 50 mg  50 mg Oral QHS Mike Craze, MD   50 mg at 04/08/12 2110  . fluvoxaMINE (LUVOX) tablet 50 mg  50 mg Oral QHS Mike Craze, MD   50 mg at 04/08/12 2109  . GRX ANALGESIC BALM OINT 1 application  1 application Apply externally QID Mike Craze, MD      . hydrOXYzine (ATARAX/VISTARIL) tablet 25 mg  25 mg Oral TID PRN Sanjuana Kava, NP   25 mg at 03/31/12 1715  . magnesium hydroxide (MILK OF MAGNESIA) suspension 30 mL  30 mL Oral Daily PRN Sanjuana Kava, NP      . meloxicam (MOBIC) tablet 7.5 mg  7.5 mg Oral Daily Mike Craze, MD   7.5 mg at 04/09/12 0829  . pantoprazole (PROTONIX) EC tablet 20 mg  20 mg Oral BID AC Mike Craze, MD   20 mg at 04/09/12 1825  . prazosin (MINIPRESS) capsule 2 mg  2 mg Oral QHS,MR X 1 Mike Craze, MD   2 mg at 04/08/12 2353  . QUEtiapine (SEROQUEL) tablet 800 mg  800 mg Oral QHS Mike Craze, MD   800 mg at 04/08/12 2109  . venlafaxine XR (EFFEXOR-XR) 24 hr  capsule 37.5 mg  37.5 mg Oral Q breakfast Mike Craze, MD   37.5 mg at 04/09/12 9604   Objective: The patient appears to be making minimal effort at recovery. Lab Results: No results found for this or any previous visit (from the past 48 hour(s)).  Physical Findings: AIMS: Facial and Oral Movements Muscles of Facial Expression: None, normal Lips and Perioral Area: None, normal Jaw: None, normal Tongue: None, normal,Extremity Movements Upper (arms, wrists, hands, fingers): None, normal Lower (legs, knees, ankles, toes): None, normal, Trunk Movements Neck, shoulders, hips: None, normal, Overall Severity Severity of abnormal movements (highest score from questions above): None, normal Incapacitation due to abnormal movements: None, normal Patient's awareness of abnormal movements (rate only patient's report): No Awareness, Dental Status Current problems with teeth and/or dentures?: No Does patient  usually wear dentures?: No  CIWA:  CIWA-Ar Total: 3  COWS:  COWS Total Score: 2   Assessment: Treatment Plan Summary:  1. Daily contact with patient to assess and evaluate symptoms and progress in treatment.  2. Medication management  3. The patient will deny suicidal ideations or homicidal ideations for 48 hours prior to discharge and have a depression and anxiety rating of 3 or less.The patient will also deny any auditory or visual hallucinations or delusional thinking.  4. The patient will deny any symptoms of substance withdrawal at time of discharge.  Plan:  1. Continue current med  encourged to stay out of bed and stay active to sleep better at night 04/09/2012, 8:44 PM

## 2012-04-09 NOTE — Progress Notes (Signed)
D Rice reamisn acutely sad...staying mostly in a fetal position in his bed...he avoids eye contact with this nurse,, he only takes his meds when this nurse confronts him in his room and makes him  Walk to the medication window.  A He has poor hygiene, he is wearing same clothes he wore yesterday and his hair is dirty. His skin is ashy grey.He denies need for prns and says he cont to struggle with sleeping at night.He denied SI in the past 24 hrs and rated his depression and hopelessnes "6/6".  R Safety is in place and POC cont with therapeutic relationship fostered PD RN [facility]

## 2012-04-10 MED ORDER — AMITRIPTYLINE HCL 25 MG PO TABS
75.0000 mg | ORAL_TABLET | Freq: Every day | ORAL | Status: DC
  Administered 2012-04-10: 75 mg via ORAL
  Filled 2012-04-10 (×2): qty 1
  Filled 2012-04-10: qty 42
  Filled 2012-04-10: qty 3

## 2012-04-10 MED ORDER — PRAZOSIN HCL 1 MG PO CAPS
2.0000 mg | ORAL_CAPSULE | Freq: Every evening | ORAL | Status: DC | PRN
  Administered 2012-04-10: 2 mg via ORAL
  Filled 2012-04-10: qty 2

## 2012-04-10 MED ORDER — MELOXICAM 7.5 MG PO TABS
7.5000 mg | ORAL_TABLET | Freq: Two times a day (BID) | ORAL | Status: DC
  Administered 2012-04-10 – 2012-04-11 (×2): 7.5 mg via ORAL
  Filled 2012-04-10: qty 1
  Filled 2012-04-10: qty 28
  Filled 2012-04-10 (×3): qty 1
  Filled 2012-04-10: qty 28
  Filled 2012-04-10: qty 1

## 2012-04-10 MED ORDER — FLUVOXAMINE MALEATE 50 MG PO TABS
75.0000 mg | ORAL_TABLET | Freq: Every day | ORAL | Status: DC
  Administered 2012-04-10: 75 mg via ORAL
  Filled 2012-04-10: qty 21
  Filled 2012-04-10 (×2): qty 2

## 2012-04-10 MED ORDER — FLUVOXAMINE MALEATE 50 MG PO TABS
75.0000 mg | ORAL_TABLET | Freq: Every day | ORAL | Status: DC
  Filled 2012-04-10: qty 2

## 2012-04-10 MED ORDER — PRAZOSIN HCL 2 MG PO CAPS
4.0000 mg | ORAL_CAPSULE | Freq: Every day | ORAL | Status: DC
  Administered 2012-04-10: 4 mg via ORAL
  Filled 2012-04-10 (×3): qty 2

## 2012-04-10 NOTE — Progress Notes (Signed)
Patient attended MHT group. Writer used the therapeutic ball and physical activity to talk about issues and to get to know peers. 

## 2012-04-10 NOTE — Progress Notes (Signed)
Patient denied SI and HI.   Denied A/V hallucinations.  Has been cooperative and pleasant.

## 2012-04-10 NOTE — Progress Notes (Signed)
Psychoeducational Group Note  Date:  04/11/2012 Time: 2000  Group Topic/Focus:  Wrap-Up Group:   The focus of this group is to help patients review their daily goal of treatment and discuss progress on daily workbooks.  Participation Level:  Active  Participation Quality:  Resistant  Affect:  Irritable  Cognitive:  Appropriate  Insight:  Good  Engagement in Group:  Limited  Additional Comments:  Patient stated that his achieved goal was to attend all the groups. When asked to share with the group what he learned, patient stated that he does not learn anything in groups because he hears the same thing. Patient continued to resist and became irritable. 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   Breeanne Oblinger R 04/11/2012, 12:00 AM

## 2012-04-10 NOTE — Progress Notes (Signed)
Group Note  Date:  04/10/2012 Time:  1:15  Group Topic/Focus:  Overcoming Obstacles to Wellness  Participation Level:  Active  Participation Quality:  Appropriate  Affect:  Appropriate  Cognitive:  Appropriate  Insight:  Good  Engagement in Group:  Good  Additional Comments:  When conversation moved to physical addictions, May shared about his use and eventual quitting of crack. He appropriately responded to others and openly contributed his struggles to the group.  Antwone Capozzoli S 04/10/2012, 2:59 PM

## 2012-04-10 NOTE — Progress Notes (Signed)
[facility] MD Progress Note  04/10/2012 5:23 PM S: "I am a little better with the anxiety, but am still having trouble with getting to sleep.  I feel groggy in the AM's too".  O: Pt still a bundle of nerves, gyrating and vibrating some. Will increase the Elavil and Luvox and shift to after supper to help getting him to sleep earlier and to have the hangover effect end earlier for him.  Will push the Minipress too.   ROS: Neuro: denies headaches, ataxia, weakness GI: denies N/V/D/cramps/constipation MS: denies weakness, muscle cramps, but notes aches on Mobic with minimal benefit, will increase to BID.  Plan: Push Luvox, Elavil, and Minipress for increased sedation and Mobic for better pain management.  Plan to D/C tomorrow.  Diagnosis:   Axis I: Bipolar, Depressed, Obsessive Compulsive Disorder and Substance Abuse Axis II: Deferred Axis III:  Past Medical History  Diagnosis Date  . Anxiety   . Depression   . Drug addiction    Axis IV: other psychosocial or environmental problems Axis V: 51-60 moderate symptoms  ADL's:  Intact  Sleep: Fair, says that he did not sleep at all last night, but staff seem to think he slept 4.5 hours.  Appetite:  Fair  Suicidal Ideation:  Pt denies any thoughts, plans, intent of suicide Homicidal Ideation:  Pt denies any thoughts, plans, intent of homicide  AEB (as evidenced by):per pt report  Mental Status Examination/Evaluation: Objective:  Appearance: Casual  Eye Contact::  Good  Speech:  Clear and Coherent  Volume:  Normal  Mood:  Anxious and Dysphoric  Affect:  gyrating in his legs and abdomen and arms with anxiety  Thought Process:  Coherent  Orientation:  Full  Thought Content:  WDL  Suicidal Thoughts:  No  Homicidal Thoughts:  No  Memory:  Immediate;   Good Recent;   Good Remote;   Good  Judgement:  Fair  Insight:  Good  Psychomotor Activity:  Normal  Concentration:  Good  Recall:  Good  Akathisia:  No  Handed:  Right  AIMS  (if indicated):     Assets:  Communication Skills Desire for Improvement  Sleep:  Number of Hours: 2.25    Vital Signs:Blood pressure 84/54, pulse 100, temperature 98.3 F (36.8 C), temperature source Oral, resp. rate 18, height 5\' 9"  (1.753 m), weight 86.637 kg (191 lb). Current Medications: Current Facility-Administered Medications  Medication Dose Route Frequency Provider Last Rate Last Dose  . acetaminophen (TYLENOL) tablet 650 mg  650 mg Oral Q6H PRN Sanjuana Kava, NP      . alum & mag hydroxide-simeth (MAALOX/MYLANTA) 200-200-20 MG/5ML suspension 30 mL  30 mL Oral Q4H PRN Sanjuana Kava, NP      . amitriptyline (ELAVIL) tablet 75 mg  75 mg Oral QPC supper Mike Craze, MD      . fluvoxaMINE (LUVOX) tablet 75 mg  75 mg Oral QPC supper Mike Craze, MD      . Oletta Cohn ANALGESIC BALM OINT 1 application  1 application Apply externally QID Mike Craze, MD      . hydrOXYzine (ATARAX/VISTARIL) tablet 25 mg  25 mg Oral TID PRN Sanjuana Kava, NP   25 mg at 03/31/12 1715  . magnesium hydroxide (MILK OF MAGNESIA) suspension 30 mL  30 mL Oral Daily PRN Sanjuana Kava, NP      . meloxicam (MOBIC) tablet 7.5 mg  7.5 mg Oral BID Mike Craze, MD      .  pantoprazole (PROTONIX) EC tablet 20 mg  20 mg Oral BID AC Mike Craze, MD   20 mg at 04/10/12 1627  . prazosin (MINIPRESS) capsule 2 mg  2 mg Oral QHS PRN Mike Craze, MD      . prazosin (MINIPRESS) capsule 4 mg  4 mg Oral QHS Mike Craze, MD      . QUEtiapine (SEROQUEL) tablet 800 mg  800 mg Oral QHS Mike Craze, MD   800 mg at 04/09/12 2103  . DISCONTD: amitriptyline (ELAVIL) tablet 50 mg  50 mg Oral QHS Mike Craze, MD   50 mg at 04/09/12 2102  . DISCONTD: fluvoxaMINE (LUVOX) tablet 50 mg  50 mg Oral QHS Mike Craze, MD   50 mg at 04/09/12 2103  . DISCONTD: fluvoxaMINE (LUVOX) tablet 75 mg  75 mg Oral QPC supper Mike Craze, MD      . DISCONTD: meloxicam [facility]) tablet 7.5 mg  7.5 mg Oral Daily Mike Craze, MD   7.5 mg at  04/10/12 0842  . DISCONTD: prazosin (MINIPRESS) capsule 2 mg  2 mg Oral QHS,MR X 1 Mike Craze, MD   2 mg at 04/10/12 0022    Lab Results:  No results found for this or any previous visit (from the past 48 hour(s)).  Physical Findings: AIMS: Facial and Oral Movements Muscles of Facial Expression: None, normal Lips and Perioral Area: None, normal Jaw: None, normal Tongue: None, normal,Extremity Movements Upper (arms, wrists, hands, fingers): None, normal Lower (legs, knees, ankles, toes): None, normal, Trunk Movements Neck, shoulders, hips: None, normal, Overall Severity Severity of abnormal movements (highest score from questions above): None, normal Incapacitation due to abnormal movements: None, normal Patient's awareness of abnormal movements (rate only patient's report): No Awareness, Dental Status Current problems with teeth and/or dentures?: No Does patient usually wear dentures?: No  CIWA:  CIWA-Ar Total: 3  COWS:  COWS Total Score: 2   Treatment Plan Summary: Daily contact with patient to assess and evaluate symptoms and progress in treatment Medication management  Frank [name] 04/10/2012, 5:23 PM

## 2012-04-10 NOTE — Progress Notes (Signed)
Patient ID: Frank [name], male   DOB: 05/16/1951, 61 y.o.   MRN: [id number] D- Patient reports poor sleep/  He has a good appetite.  His ability to pay attention is improving.  He rates his depression a 5 and his hopelessness a 8.  He denies SI.  He has been attending groups with encouragement.  A- Asked patient to take a shower as odor was noticeable.  R-Patient did comb his hair, his interaction with peers and staff is minimal.

## 2012-04-10 NOTE — Progress Notes (Signed)
Patient seen during d/c planning group.  He continues to endorse depression and anxiety and rates both at five.  Patient is hopeful to return to the [facility] he was living in prior to admission.  He reports having no other option as to where he will live.  Writer to make referral to [employer].

## 2012-04-10 NOTE — Tx Team (Signed)
Interdisciplinary Treatment Plan Update (Adult)  Date:  04/10/2012  Time Reviewed:  10:01 AM   Progress in Treatment: Attending groups:   Yes   Participating in groups:  Yes Taking medication as prescribed:  Yes Tolerating medication:  Yes Family/Significant othe contact made: No fami Patient understands diagnosis:  Yes Discussing patient identified problems/goals with staff: Yes Medical problems stabilized or resolved: Yes Denies suicidal/homicidal ideation:Yes Issues/concerns per patient self-inventory:  Other:  New problem(s) identified:  Reason for Continuation of Hospitalization: Anxiety Depression Medication stabilization  Interventions implemented related to continuation of hospitalization:  Medication Management; safety checks q 15 mins  Additional comments:  Estimated length of stay:  Discharge Plan:  New goal(s):  Review of initial/current patient goals per problem list:    1.  Goal(s): Eliminate SI/other thoughts of self harm   Met:  Yes  Target date: d/c  As evidenced by: Patient no longer endorsing SI/HI or other thoughts of self harm.    2.  Goal (s): Reduce depression/anxiety (rates all symptoms at five today)   Met:  No  Target date: d/c  As evidenced by: Patient will rate symptoms at four or below    3.  Goal(s): .stabilize on meds   Met:  No  Target date: d/c  As evidenced by: Patient will report being stable on medications - symptoms have decreased    4.  Goal(s): Refer for outpatient follow up   Met:  No  Target date: d/c  As evidenced by: Follow up appointment will be scheduled    Attendees: Patient:     Nursing:  Quintella Reichert, RN 04/10/2012 10:08 AM  Physician:  Orson Aloe, MD 04/10/2012 10:01 AM   Nursing:   Dennison Nancy, RN 04/10/2012 10:01 AM   CaseManager:  Juline Patch, LCSW 04/10/2012 10:01 AM   Counselor:  Angus Palms, LCSW 04/10/2012 10:01 AM

## 2012-04-10 NOTE — Progress Notes (Signed)
Pt  up and around milieu, watching TV with peers however keeps to self. Stated it was difficult to concentrate on TV given all he's dealing with. He is flat, sad, depressed with poor hygiene. Continues to have poor sleep and states restoril and Remus Loffler are the only 2 meds that have been effective however MD does not want to prescribe due to substance hx. Pt states his SA was in the '90s and he does not feel it is an issue at this time. Explained to pt these meds could reactivate the chemicals in the brain related to substance abuse/addiction. Support given. Pt denies SI/HI/AVH and remains safe. Lawrence Marseilles

## 2012-04-11 MED ORDER — MELOXICAM 7.5 MG PO TABS: 7.5000 mg | ORAL_TABLET | Freq: Two times a day (BID) | ORAL | Status: DC

## 2012-04-11 MED ORDER — PRAZOSIN HCL 2 MG PO CAPS: 4.0000 mg | ORAL_CAPSULE | Freq: Every day | ORAL | Status: DC

## 2012-04-11 MED ORDER — AMITRIPTYLINE HCL 75 MG PO TABS: 75.0000 mg | ORAL_TABLET | Freq: Every day | ORAL | Status: DC

## 2012-04-11 MED ORDER — QUETIAPINE FUMARATE 400 MG PO TABS: 800.0000 mg | ORAL_TABLET | Freq: Every day | ORAL | Status: DC

## 2012-04-11 MED ORDER — PRAZOSIN HCL 1 MG PO CAPS
4.0000 mg | ORAL_CAPSULE | Freq: Every day | ORAL | Status: DC
  Filled 2012-04-11: qty 84

## 2012-04-11 MED ORDER — PANTOPRAZOLE SODIUM 20 MG PO TBEC: 20.0000 mg | DELAYED_RELEASE_TABLET | Freq: Two times a day (BID) | ORAL | Status: DC

## 2012-04-11 MED ORDER — FLUVOXAMINE MALEATE 25 MG PO TABS: 75.0000 mg | ORAL_TABLET | Freq: Every day | ORAL | Status: DC

## 2012-04-11 NOTE — Progress Notes (Signed)
Psychoeducational Group Note  Date:  04/11/2012 Time:  1000  Group Topic/Focus:  Self Care:   The focus of this group is to help patients understand the importance of self-care in order to improve or restore emotional, physical, spiritual, interpersonal, and financial health.  Participation Level:  Did Not Attend  Participation Quality:    Affect:    Cognitive:    Insight:    Engagement in Group:    Additional Comments:   Ralph Leyden 04/11/2012, 1:31 PM

## 2012-04-11 NOTE — Discharge Summary (Addendum)
Physician Discharge Summary Note  Patient:  Frank [name] is an 61 y.o., male MRN:  [id number] DOB:  [birth year] Patient phone:  [phone] (home)  Patient address:   [address] [place] [address]   Date of Admission:  03/31/2012 Date of Discharge:   Discharge Diagnoses: Principal Problem:  *Chronic pain associated with significant psychosocial dysfunction Active Problems:  Depression  Axis Diagnosis:  Axis I: Bipolar, Depressed, Obsessive Compulsive Disorder and Substance Abuse  Axis II: Deferred  Axis III:  Past Medical History   Diagnosis  Date   .  Anxiety    .  Depression    .  Drug addiction     Axis IV: other psychosocial or environmental problems  Axis V: 51-60 moderate symptoms  Level of Care:  OP  Hospital Course:   Presented to [facility]. Stated he had been living in an [facility] for the past 11 months used oxy and failed a UDS so he was"kicked out". Is depressed and feels like hurting himself . While a patient in this hospital, ABRAM SAX was enrolled in group counseling and activities as well as received the following medication:amitriptyline (ELAVIL) 75 MG tablet, Take 75 mg by mouth daily after supper., Disp: , Rfl: ;  fluvoxaMINE (LUVOX) 25 MG tablet, Take 75 mg by mouth daily after supper., Disp: , Rfl: ;  meloxicam (MOBIC) 7.5 MG tablet, Take 7.5 mg by mouth 2 (two) times daily., Disp: , Rfl: ;  prazosin (MINIPRESS) 2 MG capsule, Take 4-6 mg by mouth at bedtime., Disp: , Rfl:  QUEtiapine (SEROQUEL) 400 MG tablet, Take 800 mg by mouth at bedtime., Disp: , Rfl:   Patient attended treatment team meeting this am and met with treatment team members. Pt symptoms, treatment plan and response to treatment discussed. Robyne Peers endorsed that their symptoms have improved. Pt also stated that they are stable for discharge.  In other to control Principal Problem:  *Chronic pain associated with significant psychosocial dysfunction Active Problems:   Depression , they will continue psychiatric care on outpatient basis. They will follow-up at  Follow-up Information    Follow up with [facility]. On 04/14/2012. (Please go to [facility] walk-in clinic on Thursday, Octrober 17, 2013 or any weekday between 8AM-3PM)    Contact information:   [address] [place], [place]  [address]  [phone]      Follow up with [name]. On 04/13/2012. (You are scheduled with [name] at 11:AM on Thursday, October 17,2013)    Contact information:   [address] [place], [place]  [address]  [phone]       .  In addition they were instructed to take all your medications as prescribed by your mental healthcare provider, to report any adverse effects and or reactions from your medicines to your outpatient provider promptly, patient is instructed and cautioned to not engage in alcohol and or illegal drug use while on prescription medicines, in the event of worsening symptoms, patient is instructed to call the crisis hotline, 911 and or go to the nearest ED for appropriate evaluation and treatment of symptoms.   Upon discharge, patient adamantly denies suicidal, homicidal ideations, auditory, visual hallucinations and or delusional thinking. They left [facility] with all personal belongings in no apparent distress.  Consults:  See electronic record for details  Significant Diagnostic Studies:  See electronic record for details  Discharge Vitals:   Blood pressure 118/80, pulse 106, temperature 97 F (36.1 C),  temperature source Oral, resp. rate 18, height 5\' 9"  (1.753 m), weight 86.637 kg (191 lb)..  Mental Status Exam: See Mental Status Examination and Suicide Risk Assessment completed by Attending Physician prior to discharge.  Discharge destination:  Home  Is patient on multiple antipsychotic therapies at discharge:  No  Has Patient had three or more failed trials of antipsychotic monotherapy by history: N/A Recommended Plan for  Multiple Antipsychotic Therapies: N/A    Medication List     As of 05/09/2012  3:19 PM    STOP taking these medications         QUEtiapine 300 MG tablet   Commonly known as: SEROQUEL      venlafaxine XR 75 MG 24 hr capsule   Commonly known as: EFFEXOR-XR      zolpidem 10 MG tablet   Commonly known as: AMBIEN           Follow-up Information    Follow up with [facility]. On 04/14/2012. (Please go to [facility] walk-in clinic on Thursday, Octrober 17, 2013 or any weekday between 8AM-3PM)    Contact information:   [address] [place], [place]  [address]  [phone]      Follow up with [name]. On 04/13/2012. (You are scheduled with [name] at 11:AM on Thursday, October 17,2013)    Contact information:   [address] [place], [place]  [address]  [phone]        Follow-up recommendations:   Activities: Resume typical activities Diet: Resume typical diet Tests: none Other: Follow up with outpatient provider and report any side effects to out patient prescriber.  Comments:  Take all your medications as prescribed by your mental healthcare provider. Report any adverse effects and or reactions from your medicines to your outpatient provider promptly. Patient is instructed and cautioned to not engage in alcohol and or illegal drug use while on prescription medicines. In the event of worsening symptoms, patient is instructed to call the crisis hotline, 911 and or go to the nearest ED for appropriate evaluation and treatment of symptoms. Follow-up with your primary care provider for your other medical issues, concerns and or health care needs.  SignedOrson Aloe 05/09/2012 3:19 PM

## 2012-04-11 NOTE — Progress Notes (Signed)
Patient ID: Frank [name], male   DOB: April 26, 1951, 61 y.o.   MRN: [id number] Patient denies SI/HI and A/V hallucinations; reviewed AVS with the patient and also prescriptions that were sent to pharmacy; patient also received a copy of the AVS and samples; patient  verbalized understanding; patient verbalized that he received all belongings from locker 18;

## 2012-04-11 NOTE — Tx Team (Signed)
Interdisciplinary Treatment Plan Update (Adult)  Date:  04/11/2012  Time Reviewed:  10:30 AM   Progress in Treatment: Attending groups:   Yes   Participating in groups:  Yes Taking medication as prescribed:  Yes Tolerating medication:  Yes Family/Significant othe contact made:  Patient understands diagnosis:  Yes Discussing patient identified problems/goals with staff: Yes Medical problems stabilized or resolved: Yes Denies suicidal/homicidal ideation:Yes Issues/concerns per patient self-inventory:  Other:  New problem(s) identified:  Reason for Continuation of Hospitalization:  Interventions implemented related to continuation of hospitalization:   Additional comments:  Estimated length of stay:  Discharge today  Discharge Plan:  Home with outpatient follow up  New goal(s):  Review of initial/current patient goals per problem list:    1.  Goal(s):  Eliminate SI/other thoughts of self harm   Met:  Yes  Target date: d/c As evidenced by: Patient no longer endorsing SI/HI or other thoughts of self harm  2.  Goal (s): Reduce depression/anxiety (rates all symptoms at five today   Met:  No  Target date: d/c  As evidenced by:  Deferred to to symptoms being high due to being homeless  3.  Goal(s):  .stabilize on meds   Met:  Yes  Target date: d/c  As evidenced by: Patient reports being stabilized on medications - less symptomatic    4.  Goal(s): Refer for outpatient follow up   Met:  Yes  Target date: d/c  As evidenced by: Follow up appointment scheduled    Attendees: Patient:  Frank [name] 04/11/2012 10:30 AM  Nursing:  Dennison Nancy, RN 04/11/2012 10:30 AM  Physician:  Orson Aloe, MD 04/11/2012 10:30 AM   Nursing:   Jacques Navy, RN 04/11/2012 10:30 AM   CaseManager:  Juline Patch, LCSW 04/11/2012 10:30 AM   Counselor:  Angus Palms, LCSW 04/11/2012 10:30 AM   Other:  Quintella Reichert, RN      04/11/2012 10:31 AM

## 2012-04-11 NOTE — Progress Notes (Signed)
[facility] Group Notes: (Counselor/Nursing/MHT/Case Management/Adjunct) 04/11/2012   @ 1:15-2:30PM Feelings About Diagnosis  Type of Therapy:  Group Therapy  Participation Level:  Active  Participation Quality: Appropriate, Sharing, Somewhat Distrusting    Affect:  Blunted  Cognitive:  Appropriate  Insight:  Limited  Engagement in Group: Good  Engagement in Therapy:  Good  Modes of Intervention:  Support and Exploration  Summary of Progress/Problems: Frank [name] spoke up in group about his diagnosis of Major Depression. He shared that he has trouble trusting diagnoses because he has been given different ones by different doctors. He went on to explore how he sees so many people being diagnosed with Bipolar Disorder now that it seems to be the popular thing to do. [name] stated he thinks it matters to know what diagnosis one has, but not to focus so much of life around it, because it really doesn't change anything except possibly the medications taken. He was then called out of group to meet with the congregational social worker.   Angus Palms, LCSW 04/11/2012  3:19 PM

## 2012-04-11 NOTE — Progress Notes (Signed)
Pt observed in the dayroom watching TV.  Pt reports he feels about the same.  He says he has tried to go to all the groups today.  He denies SI/HI/AV, but still endorsed depression/anxiety.  He offers little in conversation.  He does make his needs known to staff.  He says the MD changed his meds so that maybe he could sleep tonight.  He is on scheduled meds for his chronic pain.  He voices no other needs/concerns.  Safety maintained with q15 minutes

## 2012-04-11 NOTE — [facility] Suicide Risk Assessment (Addendum)
Suicide Risk Assessment  Discharge Assessment     Current Mental Status by Physician: Patient denies suicidal or homicidal ideation, hallucinations, illusions, or delusions. Patient engages with good eye contact, is able to focus adequately in a one to one setting, and has clear goal directed thoughts. Patient speaks with a natural conversational volume, rate, and tone. Anxiety was reported at 8 on a scale of 1 the least and 10 the most. Depression was reported at 5 on the same scale. Patient is oriented times 4, recent and remote memory intact. Judgement: .limited by his mental illness and his addictive thinking. Insight: limited by his mental illness and his addictive thinking.  Demographic factors:  Male;Divorced or widowed;Caucasian;Low socioeconomic status;Living alone Loss Factors:  Loss of significant relationship;Financial problems / change in socioeconomic status Historical Factors:  Family history of mental illness or substance abuse Risk Reduction Factors:  NA  Continued Clinical Symptoms:  Severe Anxiety and/or Agitation Depression:   Anhedonia Comorbid alcohol abuse/dependence Insomnia Alcohol/Substance Abuse/Dependencies Chronic Pain Previous Psychiatric Diagnoses and Treatments  Discharge Diagnoses: Axis I: Bipolar, Depressed, Obsessive Compulsive Disorder and Substance Abuse  Axis II: Deferred  Axis III:  Past Medical History   Diagnosis  Date   .  Anxiety    .  Depression    .  Drug addiction     Axis IV: other psychosocial or environmental problems  Axis V: 51-60 moderate symptoms  Cognitive Features That Contribute To Risk:  Thought constriction (tunnel vision)    Suicide Risk:  Minimal: No identifiable suicidal ideation.  Patients presenting with no risk factors but with morbid ruminations; may be classified as minimal risk based on the severity of the depressive symptoms  Labs:  No results found for this or any previous visit (from the past 72  hour(s)). RISK REDUCTION FACTORS: What pt has learned from hospital stay is that there is hope, not that he can see it, but others always see it.  Risk of self harm is elevated by his bipolar disorder, his anxiety, his rigid thinking, and his addictions, but is willing to be willing to accept the losses he has sustained recently.  Risk of harm to others is minimal in that he has not been involved in fights or had any legal charges filed on him.  Pt seen in treatment team where he divulged the above information. The treatment team concluded that he was ready for discharge and had met him goals for an inpatient setting.  PLAN: Discharge home Continue   Medication List     As of 04/11/2012 12:56 PM    STOP taking these medications         venlafaxine XR 75 MG 24 hr capsule   Commonly known as: EFFEXOR-XR      zolpidem 10 MG tablet   Commonly known as: AMBIEN      TAKE these medications      Indication    amitriptyline 75 MG tablet   Commonly known as: ELAVIL   Take 1 tablet (75 mg total) by mouth daily after supper.    Indication: for depression, anxiety, pain management, and insomnia      fluvoxaMINE 25 MG tablet   Commonly known as: LUVOX   Take 3 tablets (75 mg total) by mouth daily after supper.    Indication: Obsessive Compulsive Disorder, insomnia      meloxicam 7.5 MG tablet   Commonly known as: MOBIC   Take 1 tablet (7.5 mg total) by mouth 2 (two) times daily.  Indication: for back pain and inflammation      pantoprazole 20 MG tablet   Commonly known as: PROTONIX   Take 1 tablet (20 mg total) by mouth 2 (two) times daily before a meal.    Indication: for acid reduction to help pt tolerate Mobic      prazosin 2 MG capsule   Commonly known as: MINIPRESS   Take 2-3 capsules (4-6 mg total) by mouth at bedtime.    Indication: insomnia and nightmares      QUEtiapine 400 MG tablet   Commonly known as: SEROQUEL   Take 2 tablets (800 mg total) by mouth at  bedtime.    Indication: Trouble Sleeping, mood dysregulation       Follow-up recommendations:  Activities: Resume typical activities Diet: Resume typical diet Tests: none Other: Follow up with outpatient provider and report any side effects to out patient prescriber.  Is patient on multiple antipsychotic therapies at discharge:  No  Has Patient had three or more failed trials of antipsychotic monotherapy by history: N/A Recommended Plan for Multiple Antipsychotic Therapies: N/A  Dan Humphreys, Ziyana Morikawa 04/11/2012 12:56 PM

## 2012-04-12 NOTE — Progress Notes (Signed)
Patient Discharge Instructions:  After Visit Summary (AVS):   Faxed to:  04/12/12 Discharge Summary Note:   Faxed to:  04/12/12 Psychiatric Admission Assessment Note:   Faxed to:  04/12/12 Suicide Risk Assessment - Discharge Assessment:   Faxed to:  04/12/12 Faxed/Sent to the Next Level Care provider:  04/12/12  Faxed to [facility] @[phone] & [facility] @  [phone]  Karleen Hampshire Brittini, 04/12/2012, 3:57 PM

## 2012-04-21 ENCOUNTER — Encounter (HOSPITAL_COMMUNITY): Payer: Self-pay | Admitting: Emergency Medicine

## 2012-04-21 ENCOUNTER — Emergency Department (HOSPITAL_COMMUNITY)
Admission: EM | Admit: 2012-04-21 | Discharge: 2012-04-23 | Disposition: A | Discharge status: Other Acute Inpatient Hospital | Payer: Self-pay | Attending: Emergency Medicine | Admitting: Emergency Medicine

## 2012-04-21 DIAGNOSIS — F1410 Cocaine abuse, uncomplicated: Secondary | ICD-10-CM | POA: Insufficient documentation

## 2012-04-21 DIAGNOSIS — F3289 Other specified depressive episodes: Secondary | ICD-10-CM | POA: Insufficient documentation

## 2012-04-21 DIAGNOSIS — R45851 Suicidal ideations: Secondary | ICD-10-CM | POA: Insufficient documentation

## 2012-04-21 DIAGNOSIS — F411 Generalized anxiety disorder: Secondary | ICD-10-CM | POA: Insufficient documentation

## 2012-04-21 DIAGNOSIS — Z0289 Encounter for other administrative examinations: Secondary | ICD-10-CM | POA: Insufficient documentation

## 2012-04-21 DIAGNOSIS — F329 Major depressive disorder, single episode, unspecified: Secondary | ICD-10-CM | POA: Insufficient documentation

## 2012-04-21 LAB — COMPREHENSIVE METABOLIC PANEL
AST: 23 U/L (ref 0–37)
Albumin: 4.4 g/dL (ref 3.5–5.2)
Chloride: 97 mEq/L (ref 96–112)
Creatinine, Ser: 0.99 mg/dL (ref 0.50–1.35)
Sodium: 134 mEq/L — ABNORMAL LOW (ref 135–145)
Total Bilirubin: 0.4 mg/dL (ref 0.3–1.2)

## 2012-04-21 LAB — CBC
MCV: 82.4 fL (ref 78.0–100.0)
Platelets: 287 10*3/uL (ref 150–400)
RDW: 14.7 % (ref 11.5–15.5)
WBC: 7.7 10*3/uL (ref 4.0–10.5)

## 2012-04-21 LAB — RAPID URINE DRUG SCREEN, HOSP PERFORMED
Amphetamines: NOT DETECTED
Cocaine: POSITIVE — AB
Opiates: NOT DETECTED
Tetrahydrocannabinol: NOT DETECTED

## 2012-04-21 MED ORDER — AMITRIPTYLINE HCL 25 MG PO TABS
75.0000 mg | ORAL_TABLET | Freq: Every day | ORAL | Status: DC
  Administered 2012-04-22: 75 mg via ORAL
  Filled 2012-04-21: qty 3

## 2012-04-21 MED ORDER — ZOLPIDEM TARTRATE 5 MG PO TABS
5.0000 mg | ORAL_TABLET | Freq: Every evening | ORAL | Status: DC | PRN
  Administered 2012-04-21 – 2012-04-22 (×2): 5 mg via ORAL
  Filled 2012-04-21 (×3): qty 1

## 2012-04-21 MED ORDER — QUETIAPINE FUMARATE 100 MG PO TABS
800.0000 mg | ORAL_TABLET | Freq: Every day | ORAL | Status: DC
  Administered 2012-04-21 – 2012-04-22 (×2): 800 mg via ORAL
  Filled 2012-04-21 (×2): qty 2

## 2012-04-21 MED ORDER — NICOTINE 21 MG/24HR TD PT24
21.0000 mg | MEDICATED_PATCH | Freq: Every day | TRANSDERMAL | Status: DC
  Filled 2012-04-21: qty 1

## 2012-04-21 MED ORDER — LORAZEPAM 1 MG PO TABS
1.0000 mg | ORAL_TABLET | Freq: Three times a day (TID) | ORAL | Status: DC | PRN
  Administered 2012-04-21 – 2012-04-23 (×2): 1 mg via ORAL
  Filled 2012-04-21 (×2): qty 1

## 2012-04-21 MED ORDER — FLUVOXAMINE MALEATE 50 MG PO TABS
75.0000 mg | ORAL_TABLET | Freq: Every day | ORAL | Status: DC
  Administered 2012-04-22: 75 mg via ORAL
  Filled 2012-04-21 (×2): qty 2

## 2012-04-21 MED ORDER — AMITRIPTYLINE HCL 25 MG PO TABS: 75.0000 mg | ORAL_TABLET | Freq: Every day | ORAL | Status: DC

## 2012-04-21 MED ORDER — MELOXICAM 7.5 MG PO TABS
7.5000 mg | ORAL_TABLET | Freq: Two times a day (BID) | ORAL | Status: DC
  Administered 2012-04-22 (×2): 7.5 mg via ORAL
  Filled 2012-04-21 (×5): qty 1

## 2012-04-21 MED ORDER — PRAZOSIN HCL 2 MG PO CAPS
4.0000 mg | ORAL_CAPSULE | Freq: Every day | ORAL | Status: DC
  Administered 2012-04-22 (×2): 6 mg via ORAL
  Filled 2012-04-21 (×3): qty 3

## 2012-04-21 MED ORDER — ACETAMINOPHEN 325 MG PO TABS
650.0000 mg | ORAL_TABLET | ORAL | Status: DC | PRN
  Filled 2012-04-21: qty 2

## 2012-04-21 MED ORDER — IBUPROFEN 600 MG PO TABS: 600.0000 mg | ORAL_TABLET | Freq: Three times a day (TID) | ORAL | Status: DC | PRN

## 2012-04-21 MED ORDER — ONDANSETRON HCL 4 MG PO TABS: 4.0000 mg | ORAL_TABLET | Freq: Three times a day (TID) | ORAL | Status: DC | PRN

## 2012-04-21 NOTE — ED Notes (Addendum)
Pt presenting to ed with [facility] for suicidal ideations pt from [facility] pt states with a plan to kill himself by overdosing and jumping off the bridge. Pt is alert and oriented at this time. Pt states he's been on a cocaine binge last used at midnight

## 2012-04-21 NOTE — ED Notes (Signed)
Report given to Toniann Fail pt to psych ed

## 2012-04-21 NOTE — ED Notes (Signed)
1 green duffel bag, 1 pt belongings bag locked in locker # 41; 2 black suit cases locked in equipment room

## 2012-04-21 NOTE — ED Provider Notes (Signed)
History     CSN: [id number]  Arrival date & time 04/21/12  1756   First MD Initiated Contact with Patient 04/21/12 1844      Chief Complaint  Patient presents with  . Medical Clearance    (Consider location/radiation/quality/duration/timing/severity/associated sxs/prior treatment) The history is provided by the patient.   patient here with suicidal ideations with plan to jump off a bridge. Was discharged a week ago from behavior health hospital after a two-week stay for similar complaints. He admits to relapsing on cocaine. Denies any auditory or visual hallucinations. Has no somatic complaints of at this time. Went to [facility] and was sent here for further treatment  Past Medical History  Diagnosis Date  . Anxiety   . Depression   . Drug addiction     History reviewed. No pertinent past surgical history.  No family history on file.  History  Substance Use Topics  . Smoking status: Never Smoker   . Smokeless tobacco: Not on file  . Alcohol Use: No     PAIN PILLS--PERCOCET/VICODIN      Review of Systems  All other systems reviewed and are negative.    Allergies  Review of patient's allergies indicates no known allergies.  Home Medications   Current Outpatient Rx  Name Route Sig Dispense Refill  . AMITRIPTYLINE HCL 75 MG PO TABS Oral Take 75 mg by mouth daily after supper.    Marland Kitchen FLUVOXAMINE MALEATE 25 MG PO TABS Oral Take 75 mg by mouth daily after supper.    Marland Kitchen PRAZOSIN HCL 2 MG PO CAPS Oral Take 4-6 mg by mouth at bedtime.    Marland Kitchen QUETIAPINE FUMARATE 400 MG PO TABS Oral Take 800 mg by mouth at bedtime.    . MELOXICAM 7.5 MG PO TABS Oral Take 7.5 mg by mouth 2 (two) times daily.      BP 153/95  Pulse 96  Temp 99.3 F (37.4 C) (Oral)  Resp 20  SpO2 95%  Physical Exam  Nursing note and vitals reviewed. Constitutional: He is oriented to person, place, and time. He appears well-developed and well-nourished.  Non-toxic appearance. No distress.  HENT:  Head:  Normocephalic and atraumatic.  Eyes: Conjunctivae normal, EOM and lids are normal. Pupils are equal, round, and reactive to light.  Neck: Normal range of motion. Neck supple. No tracheal deviation present. No mass present.  Cardiovascular: Normal rate, regular rhythm and normal heart sounds.  Exam reveals no gallop.   No murmur heard. Pulmonary/Chest: Effort normal and breath sounds normal. No stridor. No respiratory distress. He has no decreased breath sounds. He has no wheezes. He has no rhonchi. He has no rales.  Abdominal: Soft. Normal appearance and bowel sounds are normal. He exhibits no distension. There is no tenderness. There is no rebound and no CVA tenderness.  Musculoskeletal: Normal range of motion. He exhibits no edema and no tenderness.  Neurological: He is alert and oriented to person, place, and time. He has normal strength. No cranial nerve deficit or sensory deficit. GCS eye subscore is 4. GCS verbal subscore is 5. GCS motor subscore is 6.  Skin: Skin is warm and dry. No abrasion and no rash noted.  Psychiatric: His affect is blunt. His speech is delayed. He is slowed. Thought content is not delusional. He expresses suicidal ideation. He expresses suicidal plans. He expresses no homicidal plans.    ED Course  Procedures (including critical care time)   Labs Reviewed  CBC  COMPREHENSIVE METABOLIC PANEL  ETHANOL  URINE RAPID DRUG SCREEN (HOSP PERFORMED)   No results found.   No diagnosis found.    MDM  Patient to be medically cleared and then placed by behavior health team        Toy Baker, MD 04/21/12 1905

## 2012-04-22 ENCOUNTER — Encounter (HOSPITAL_COMMUNITY): Payer: Self-pay | Admitting: Emergency Medicine

## 2012-04-22 NOTE — ED Notes (Signed)
Notified EDP of BP 105/102 and that BP meds scheduled/given at 1800

## 2012-04-22 NOTE — ED Provider Notes (Signed)
Patient alert no distress resting comfortably offers no complaint  Doug Sou, MD 04/22/12 1513

## 2012-04-22 NOTE — ED Notes (Signed)
Patient exits shower and returns to room.  States she feels a little better

## 2012-04-22 NOTE — ED Notes (Signed)
Pt took off boxers and placed in patient belonging bag. Bag placed in locker 41
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
## 2012-04-23 NOTE — BH Assessment (Signed)
[facility] Assessment Progress Note      Pt was accepted to [facility] by Dr Robet Leu per Annice Pih.  Pt will be transferred as an IVC and may go after 0800.

## 2012-04-23 NOTE — ED Notes (Signed)
PT TRANSPORTED BY SHERIFF TO [facility].

## 2012-04-23 NOTE — ED Notes (Signed)
Sheriff transporting the pt called to see if paperwork had been served on pt. They will be here shortly.

## 2012-04-23 NOTE — ED Notes (Signed)
Spoke to [employer] which said that the papers were given to the [facility] department for transportation and we could call [phone] to check on them. Writer called the number and the lady said that she thought it was transportation only and pt had already been served. Explained to her that pt needs both served and transported so she is sending someone to serve the papers and said he will transport later today as they are currently gone transporting other people at this time.

## 2012-04-23 NOTE — ED Notes (Signed)
Sheriff came to pick up pt for transport and the IVC findings and custody papers were completely filled out, parts a, b, c and d. Therefore, he couldn't transport the pt and the [facility] CSW is doing the papers over. The Sheriff said the pt may transport late today or it may be tomorrow.

## 2012-04-23 NOTE — ED Notes (Signed)
Report called to Aletta Edouard, RN at [facility]. Let her know that the time pt will actually transport is unknown at this time as Kathryne Sharper has another transport ahead of this pt and will call us when he's on his way here to our facility to get pt.

## 2012-04-23 NOTE — ED Notes (Signed)
Sheriff called to inform us that he has to transport a pt from [facility] to [facility] and then he will transport this pt to [facility]. He will call when he's on his way.

## 2012-04-23 NOTE — ED Notes (Signed)
Laying down in the bed watching TV

## 2012-04-23 NOTE — ED Notes (Signed)
SHERIFF HERE TO GET PT, PT VERY ANXIOUS, SAYS HE EXPECTED TO GO TO [facility] AND DOESN'T UNDERSTAND WHY HE'S GOING TO [facility]. EXPLAINED TO PT THAT PT'S GO TO THE FIRST ACCEPTING FACILITY WITH A BED AND [facility] WAS PROBABLY FULL OR DIDN'T ACCEPT HIM. MEDICATED PT PER MD ORDERS.

## 2012-04-23 NOTE — BH Assessment (Signed)
[facility] Assessment Progress Note      Called area facilities to inquire about bed availability.  Per Patsy at [facility] there no beds available (2349).  Per Jacob at [facility] there are no beds available (2355).  Left message at [facility] (2358).  Sponsorship beds available for [place] at [facility] per Jackie ([phone]).  Faxed referral.  

## 2012-05-14 ENCOUNTER — Emergency Department (HOSPITAL_COMMUNITY): Payer: Self-pay

## 2012-05-14 ENCOUNTER — Encounter (HOSPITAL_COMMUNITY): Payer: Self-pay

## 2012-05-14 ENCOUNTER — Other Ambulatory Visit: Payer: Self-pay

## 2012-05-14 ENCOUNTER — Emergency Department (HOSPITAL_COMMUNITY)
Admission: EM | Admit: 2012-05-14 | Discharge: 2012-05-18 | Disposition: A | Discharge status: Other Acute Inpatient Hospital | Payer: Self-pay | Attending: Emergency Medicine | Admitting: Emergency Medicine

## 2012-05-14 DIAGNOSIS — S0181XA Laceration without foreign body of other part of head, initial encounter: Secondary | ICD-10-CM

## 2012-05-14 DIAGNOSIS — S0100XA Unspecified open wound of scalp, initial encounter: Secondary | ICD-10-CM | POA: Insufficient documentation

## 2012-05-14 DIAGNOSIS — Z79899 Other long term (current) drug therapy: Secondary | ICD-10-CM | POA: Insufficient documentation

## 2012-05-14 DIAGNOSIS — Z23 Encounter for immunization: Secondary | ICD-10-CM | POA: Insufficient documentation

## 2012-05-14 DIAGNOSIS — F341 Dysthymic disorder: Secondary | ICD-10-CM | POA: Insufficient documentation

## 2012-05-14 DIAGNOSIS — F1920 Other psychoactive substance dependence, uncomplicated: Secondary | ICD-10-CM | POA: Insufficient documentation

## 2012-05-14 DIAGNOSIS — S0101XA Laceration without foreign body of scalp, initial encounter: Secondary | ICD-10-CM

## 2012-05-14 DIAGNOSIS — S61219A Laceration without foreign body of unspecified finger without damage to nail, initial encounter: Secondary | ICD-10-CM

## 2012-05-14 DIAGNOSIS — S0180XA Unspecified open wound of other part of head, initial encounter: Secondary | ICD-10-CM | POA: Insufficient documentation

## 2012-05-14 DIAGNOSIS — S61209A Unspecified open wound of unspecified finger without damage to nail, initial encounter: Secondary | ICD-10-CM | POA: Insufficient documentation
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
## 2012-05-14 MED ORDER — TETANUS-DIPHTH-ACELL PERTUSSIS 5-2.5-18.5 LF-MCG/0.5 IM SUSP
0.5000 mL | Freq: Once | INTRAMUSCULAR | Status: AC
  Administered 2012-05-14: 0.5 mL via INTRAMUSCULAR
  Filled 2012-05-14: qty 0.5

## 2012-05-14 MED ORDER — FENTANYL CITRATE 0.05 MG/ML IJ SOLN
25.0000 ug | Freq: Once | INTRAMUSCULAR | Status: AC
  Administered 2012-05-14: 25 ug via INTRAVENOUS
  Filled 2012-05-14: qty 2

## 2012-05-14 MED ORDER — HYDROCODONE-ACETAMINOPHEN 5-325 MG PO TABS
2.0000 | ORAL_TABLET | Freq: Once | ORAL | Status: AC
  Administered 2012-05-14: 2 via ORAL
  Filled 2012-05-14: qty 2
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
## 2012-05-15 MED ORDER — FLUVOXAMINE MALEATE 50 MG PO TABS
50.0000 mg | ORAL_TABLET | Freq: Every day | ORAL | Status: DC
Start: 2012-05-15 — End: 2012-05-18
  Administered 2012-05-15 – 2012-05-17 (×3): 50 mg via ORAL
  Filled 2012-05-15 (×4): qty 1

## 2012-05-15 MED ORDER — IBUPROFEN 200 MG PO TABS
600.0000 mg | ORAL_TABLET | Freq: Four times a day (QID) | ORAL | Status: DC | PRN
  Administered 2012-05-15 – 2012-05-18 (×5): 600 mg via ORAL
  Filled 2012-05-15 (×6): qty 3

## 2012-05-15 MED ORDER — AMITRIPTYLINE HCL 75 MG PO TABS
75.0000 mg | ORAL_TABLET | Freq: Every day | ORAL | Status: DC
  Administered 2012-05-15 – 2012-05-17 (×3): 75 mg via ORAL
  Filled 2012-05-15 (×4): qty 1

## 2012-05-15 MED ORDER — ZOLPIDEM TARTRATE 5 MG PO TABS
10.0000 mg | ORAL_TABLET | Freq: Every evening | ORAL | Status: DC | PRN
  Administered 2012-05-15 – 2012-05-17 (×3): 10 mg via ORAL
  Filled 2012-05-15 (×3): qty 2

## 2012-05-15 MED ORDER — HYDROCODONE-ACETAMINOPHEN 5-325 MG PO TABS
2.0000 | ORAL_TABLET | Freq: Once | ORAL | Status: AC
  Administered 2012-05-15: 2 via ORAL
  Filled 2012-05-15: qty 2

## 2012-05-15 MED ORDER — IBUPROFEN 800 MG PO TABS
800.0000 mg | ORAL_TABLET | Freq: Once | ORAL | Status: AC
  Administered 2012-05-15: 800 mg via ORAL
  Filled 2012-05-15: qty 1

## 2012-05-15 MED ORDER — QUETIAPINE FUMARATE 200 MG PO TABS
800.0000 mg | ORAL_TABLET | Freq: Every day | ORAL | Status: DC
  Administered 2012-05-15 – 2012-05-17 (×3): 800 mg via ORAL
  Filled 2012-05-15 (×3): qty 4

## 2012-05-15 MED ORDER — FLUVOXAMINE MALEATE 50 MG PO TABS
50.0000 mg | ORAL_TABLET | Freq: Every day | ORAL | Status: DC
Start: 2012-05-15 — End: 2012-05-15

## 2012-05-15 MED ORDER — HYDROCODONE-ACETAMINOPHEN 5-325 MG PO TABS: 1.0000 | ORAL_TABLET | ORAL | Status: DC | PRN

## 2012-05-15 MED ORDER — IBUPROFEN 600 MG PO TABS: 600.0000 mg | ORAL_TABLET | Freq: Four times a day (QID) | ORAL | Status: DC | PRN

## 2012-05-15 MED ORDER — MELOXICAM 7.5 MG PO TABS
7.5000 mg | ORAL_TABLET | Freq: Two times a day (BID) | ORAL | Status: DC
  Administered 2012-05-15 – 2012-05-18 (×6): 7.5 mg via ORAL
  Filled 2012-05-15 (×8): qty 1
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
## 2012-05-15 NOTE — ED Notes (Addendum)
House coverage aware no sitter avaliable until 07:00. Charge RN aware pt placed in view line of RN station. Pt changed and security called to wand pt.
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
## 2012-05-15 NOTE — ED Notes (Signed)
Upon discharge pt states he wants to kill himself by taking seroquel.  Dr Hyacinth Meeker notified.

## 2012-05-16 NOTE — [facility] Counselor (Signed)
Writer spoke with Garment/textile technologist at [place]. [name] has no paperwork on pt, but there may be paperwork in [place] office. [name] will be in later this am. Per EPIC notes, [facility] [name] had spoken w/ [name] 05/15/12 at 2 pm re: a treatment bed for pt.

## 2012-05-16 NOTE — ED Notes (Signed)
Pt requesting pain medicine.  RN made aware
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
## 2012-05-17 NOTE — ED Notes (Signed)
First meeting with patient. Patient was sleeping with NAD, patient easily aroused. Breakfast tray and sitter at bedside.

## 2012-05-17 NOTE — ED Notes (Signed)
Patient's brother Pauletta Browns from [place], [place] (called and states that the patient needs treatment for substance abuse and he would like to see his brother go into treatment. Patient had told Selena Batten, RN that his brother in [place] and himself so not think he (the patient) needs to go to treatment. John (patient's brother) states this is not true and the patient is just wanting to get out and do more drugs. ACT member advised.
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
## 2012-05-17 NOTE — ED Notes (Signed)
Patient given graham crackers and ginger ale per request. 

## 2012-05-18 ENCOUNTER — Encounter (HOSPITAL_COMMUNITY): Payer: Self-pay | Admitting: Behavioral Health

## 2012-05-18 ENCOUNTER — Inpatient Hospital Stay (HOSPITAL_COMMUNITY)
Admission: EM | Admit: 2012-05-18 | Discharge: 2012-05-23 | DRG: 885 | Disposition: A | Discharge status: Home / Self Care | Payer: Federal, State, Local not specified - Other | Attending: Psychiatry | Admitting: Psychiatry

## 2012-05-18 DIAGNOSIS — S0180XA Unspecified open wound of other part of head, initial encounter: Secondary | ICD-10-CM | POA: Diagnosis present

## 2012-05-18 DIAGNOSIS — F329 Major depressive disorder, single episode, unspecified: Principal | ICD-10-CM

## 2012-05-18 DIAGNOSIS — F32A Depression, unspecified: Secondary | ICD-10-CM

## 2012-05-18 DIAGNOSIS — F1920 Other psychoactive substance dependence, uncomplicated: Secondary | ICD-10-CM

## 2012-05-18 DIAGNOSIS — F1410 Cocaine abuse, uncomplicated: Secondary | ICD-10-CM

## 2012-05-18 DIAGNOSIS — S61209A Unspecified open wound of unspecified finger without damage to nail, initial encounter: Secondary | ICD-10-CM | POA: Diagnosis present

## 2012-05-18 DIAGNOSIS — S0190XA Unspecified open wound of unspecified part of head, initial encounter: Secondary | ICD-10-CM | POA: Diagnosis present

## 2012-05-18 DIAGNOSIS — Y998 Other external cause status: Secondary | ICD-10-CM

## 2012-05-18 DIAGNOSIS — F419 Anxiety disorder, unspecified: Secondary | ICD-10-CM

## 2012-05-18 MED ORDER — MELOXICAM 7.5 MG PO TABS
7.5000 mg | ORAL_TABLET | Freq: Two times a day (BID) | ORAL | Status: DC
  Administered 2012-05-18 – 2012-05-19 (×2): 7.5 mg via ORAL
  Filled 2012-05-18 (×6): qty 1

## 2012-05-18 MED ORDER — ACETAMINOPHEN 325 MG PO TABS: 650.0000 mg | ORAL_TABLET | Freq: Four times a day (QID) | ORAL | Status: DC | PRN

## 2012-05-18 MED ORDER — MAGNESIUM HYDROXIDE 400 MG/5ML PO SUSP: 30.0000 mL | Freq: Every day | ORAL | Status: DC | PRN

## 2012-05-18 MED ORDER — QUETIAPINE FUMARATE 400 MG PO TABS
800.0000 mg | ORAL_TABLET | Freq: Every day | ORAL | Status: DC
  Administered 2012-05-18 – 2012-05-22 (×5): 800 mg via ORAL
  Filled 2012-05-18 (×6): qty 2
  Filled 2012-05-18: qty 28
  Filled 2012-05-18 (×2): qty 2

## 2012-05-18 MED ORDER — ALUM & MAG HYDROXIDE-SIMETH 200-200-20 MG/5ML PO SUSP: 30.0000 mL | ORAL | Status: DC | PRN

## 2012-05-18 MED ORDER — AMITRIPTYLINE HCL 75 MG PO TABS
75.0000 mg | ORAL_TABLET | Freq: Every day | ORAL | Status: DC
  Administered 2012-05-18: 75 mg via ORAL
  Filled 2012-05-18: qty 1
  Filled 2012-05-18: qty 3
  Filled 2012-05-18: qty 1

## 2012-05-18 MED ORDER — FLUVOXAMINE MALEATE 50 MG PO TABS
50.0000 mg | ORAL_TABLET | Freq: Every day | ORAL | Status: DC
  Administered 2012-05-18: 50 mg via ORAL
  Filled 2012-05-18 (×3): qty 1

## 2012-05-18 MED ORDER — IBUPROFEN 600 MG PO TABS
600.0000 mg | ORAL_TABLET | Freq: Four times a day (QID) | ORAL | Status: DC | PRN
  Administered 2012-05-18: 600 mg via ORAL
  Filled 2012-05-18: qty 1

## 2012-05-18 NOTE — ED Notes (Signed)
Counselor notified of pt's request.

## 2012-05-18 NOTE — BH Assessment (Signed)
Assessment Note   Update:  Received call from [facility] stating pt was accepted to Shelda Jakes, [place] to Dr. Daleen Bo to bed 500-1 and that pt could be transported to [facility].  Updated EDP Powers and ED staff.  Updated assessment disposition, completed support paperwork and assessment notification and faxed to [facility] to log.  ED staff to arrange transport to [facility] via security, as pt is voluntary.    Disposition:  Disposition Disposition of Patient: Inpatient treatment program Type of inpatient treatment program: Adult Patient referred to: Other (Comment) (Pt accepted [facility])  On Site Evaluation by:   Reviewed with Physician:  Powers   Caryl Comes 05/18/2012 3:25 PM
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
## 2012-05-18 NOTE — ED Notes (Signed)
telepsych has begun,  Patient alert and cooperative,  Sitter at bedside.

## 2012-05-18 NOTE — Tx Team (Signed)
Initial Interdisciplinary Treatment Plan  PATIENT STRENGTHS: (choose at least two) Ability for insight Capable of independent living Motivation for treatment/growth  PATIENT STRESSORS: Financial difficulties Legal issue Marital or family conflict Occupational concerns Substance abuse   PROBLEM LIST: Problem List/Patient Goals Date to be addressed Date deferred Reason deferred Estimated date of resolution  Suicidal Ideations      Depression      Substance Abuse                                           DISCHARGE CRITERIA:  Motivation to continue treatment in a less acute level of care Reduction of life-threatening or endangering symptoms to within safe limits  PRELIMINARY DISCHARGE PLAN: Attend aftercare/continuing care group Outpatient therapy  PATIENT/FAMIILY INVOLVEMENT: This treatment plan has been presented to and reviewed with the patient, [name], and/or family member, .  The patient and family have been given the opportunity to ask questions and make suggestions.  [name] 05/18/2012, 6:15 PM

## 2012-05-18 NOTE — ED Notes (Signed)
Pt verbalized that he is feeling depressed, and anxious because he has to stay in the hospital. He states that he would like to know what is going to happen to him now.
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
## 2012-05-18 NOTE — ED Notes (Signed)
Family at bedside. 
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
## 2012-05-18 NOTE — Progress Notes (Signed)
Psychoeducational Group Note  Date:  05/18/2012 Time:  2000   Group Topic/Focus:  Karaoke    Participation Level:  Did Not Attend  Participation Quality:  None  Affect: N/A       Cognitive:  N/A    Insight:  N/A  Engagement in Group:  None  Additional Comments:   Humberto Seals Monique 05/18/2012, 10:14 PM

## 2012-05-19 DIAGNOSIS — F329 Major depressive disorder, single episode, unspecified: Secondary | ICD-10-CM

## 2012-05-19 DIAGNOSIS — F411 Generalized anxiety disorder: Secondary | ICD-10-CM

## 2012-05-19 MED ORDER — IBUPROFEN 600 MG PO TABS
600.0000 mg | ORAL_TABLET | Freq: Four times a day (QID) | ORAL | Status: DC | PRN
  Administered 2012-05-19 – 2012-05-22 (×2): 600 mg via ORAL
  Filled 2012-05-19 (×2): qty 1

## 2012-05-19 MED ORDER — FLUOXETINE HCL 10 MG PO CAPS
10.0000 mg | ORAL_CAPSULE | Freq: Every day | ORAL | Status: DC
  Administered 2012-05-19 – 2012-05-22 (×4): 10 mg via ORAL
  Filled 2012-05-19 (×5): qty 1

## 2012-05-19 MED ORDER — QUETIAPINE FUMARATE 400 MG PO TABS
800.0000 mg | ORAL_TABLET | Freq: Every day | ORAL | Status: DC
  Filled 2012-05-19 (×2): qty 2

## 2012-05-19 NOTE — [facility] Suicide Risk Assessment (Signed)
Suicide Risk Assessment  Admission Assessment     Nursing information obtained from:  Patient Demographic factors:  Male Current Mental Status:  Self-harm thoughts Loss Factors:  Financial problems / change in socioeconomic status Historical Factors:  Family history of suicide Risk Reduction Factors:  NA  CLINICAL FACTORS:   Depression:   Anhedonia Comorbid alcohol abuse/dependence Hopelessness Impulsivity Insomnia Severe Alcohol/Substance Abuse/Dependencies  COGNITIVE FEATURES THAT CONTRIBUTE TO RISK:  Thought constriction (tunnel vision)    SUICIDE RISK:   Moderate:  Frequent suicidal ideation with limited intensity, and duration, some specificity in terms of plans, no associated intent, good self-control, limited dysphoria/symptomatology, some risk factors present, and identifiable protective factors, including available and accessible social support.  PLAN OF CARE: Restart medications as appropriate. Encourage patient to attend groups and engage.   Frank [name] 05/19/2012, 10:40 AM
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
## 2012-05-19 NOTE — Progress Notes (Signed)
D: Patient cooperative with staff and peers. Patient's affect/mood depressed. Declined self inventory sheet this morning. Compliant with medication regimen.   A: Support and encouragement provided to patient. Scheduled medications administered per MD orders. Maintain 15 minute checks for safety.   R: Patient receptive. Denies SI/HI/AVH. Patient remains safe.

## 2012-05-19 NOTE — Progress Notes (Signed)
Pt. Complains of roommate snoring, unable to sleep. Geographical information systems officer with Writer. Pt. Moved to quiet room. Writer made patient aware room is for pt. That need restriction and he would be removed if such a case would warrant so. Pt. Agrees to these conditions. Staff will continue to monitor q51min for safety.

## 2012-05-19 NOTE — Progress Notes (Signed)
Psychoeducational Group Note  Date:  05/19/2012 Time:  1100  Group Topic/Focus:  Therapeutic Activity- Apples to Apples  Participation Level: Did Not Attend  Participation Quality:  Not Applicable  Affect:  Not Applicable  Cognitive:  Not Applicable  Insight:  Not Applicable  Engagement in Group: Not Applicable  Additional Comments: Patient participated in therapeutic activity on apples to apples and patients were able to express how certain words are related within the game and patient was able to learn coping skills through the game by being able to learn from others.   Karleen Hampshire Brittini 05/19/2012, 1:07 PM
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
## 2012-05-19 NOTE — Progress Notes (Signed)
[facility] Group Notes:  (Counselor/Nursing/MHT/Case Management/Adjunct) Relapse Prevention 1:15-2:30  05/19/2012 2:48 PM  Type of Therapy:  Group Therapy  Participation Level:  Active  Participation Quality:  Appropriate and Attentive  Affect:  Appropriate  Cognitive:  Alert, Appropriate and Oriented  Insight:  Good  Engagement in Group:  Good  Engagement in Therapy:  Good  Modes of Intervention:  Education, Problem-solving and Support  Summary of Progress/Problems: Patient states he is dealing with the loss of things that were most important to him.  He discussed that he is in a state of grief and mourning over the loss of relationships and the lifestyle he had.  He was insightful about how thinking negatively can lead you to relapse.   Skippy Marhefka L 05/19/2012, 2:48 PM

## 2012-05-19 NOTE — Tx Team (Signed)
Interdisciplinary Treatment Plan Update (Adult)  Date:  05/19/2012  Time Reviewed:  9:33 AM   Progress in Treatment: Attending groups:   Yes   Participating in groups:  Yes Taking medication as prescribed:  Yes Tolerating medication:  Yes Family/Significant othe contact made: Contact to be made with family Patient understands diagnosis:  Yes Discussing patient identified problems/goals with staff: Yes Medical problems stabilized or resolved: Yes Denies suicidal/homicidal ideation:Yes Issues/concerns per patient self-inventory:  Other:   New problem(s) identified:  Reason for Continuation of Hospitalization: Anxiety Depression Medication stabilization  Interventions implemented related to continuation of hospitalization:  Medication Management; safety checks q 15 mins  Additional comments:  Estimated length of stay:  2-3 days  Discharge Plan:  Home with outpatient follow up  New goal(s):  Review of initial/current patient goals per problem list:    1.  Goal(s): Eliminate SI/other thoughts of self harm   Met:  Yes  Target date: d/c  As evidenced by: Patient no longer endorsing SI/HI or other thoughts of self harm.    2.  Goal (s):Reduce depression/anxiety (rates at 4-5 days)  Met: Yes  Target date: d/c  As evidenced by: Patient will rate symptoms at four or below    3.  Goal(s):.stabilize on meds   Met:  No  Target date: d/c  As evidenced by: Patient reports being stabilized on medications - less symptomatic    4.  Goal(s): Refer for outpatient follow up   Met:  No  Target date: d/c  As evidenced by: Follow up appointment scheduled    Attendees: Patient:   05/19/2012 9:33 AM  Physican:  Patrick North, MD 05/19/2012 9:33 AM  Nursing:  Berneice Heinrich, RN 05/19/2012 9:33 AM   Nursing:   05/19/2012 9:33 AM   Clinical Social Worker:  Juline Patch, LCSW 05/19/2012 9:33 AM   Other: Serena Colonel, FNP 05/19/2012 9:33 AM   Other: Teddy Spike, MSW Intern     05/19/2012 05/19/2012

## 2012-05-20 DIAGNOSIS — F329 Major depressive disorder, single episode, unspecified: Secondary | ICD-10-CM | POA: Diagnosis present

## 2012-05-20 DIAGNOSIS — F1410 Cocaine abuse, uncomplicated: Secondary | ICD-10-CM
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
## 2012-05-20 NOTE — Progress Notes (Signed)
Frank [name] is seen out in the milieu...he remains sad, flat and depressed. HE  Denies SI ( on his self inventory) and he rates  His feelings of depression and hopelessness "4/5". He takes his meds as ordered, he attends his groups and  He says he wants to make changes to better his life. ]  A He completes his self inventory. He says he " doesn't know" where he will be DC'Frank to .   R Safety in place and therapeutic relationship fostered.

## 2012-05-20 NOTE — Progress Notes (Signed)
Goals Group Note  Date:  02/05/2012 Time: 1015  Group Topic/Focus:  Identifying Goals:   The focus of this group is to help patients identify the  Goals they want to work on, to introduce them to their Saturday patient workbook and to help motivate them to begin the journey of changing unhealthy behaviors and replacing them with healthier ones.  Participation Level:  active Participation Quality: good Affect: flat Cognitive:    Insight:  good  Engagement in Group: engaged  Additional Comments: Pt was engaged in group, asked appropriate questions, shared personal life experience with the group and demonstrates willingness to process and understand her problems. PDuke RN BC [id number]

## 2012-05-20 NOTE — Progress Notes (Signed)
Patient has been up and active on the unit, attended group this evening and has voiced no complaints. Patient currently denies having pain, -si/hi/a/v hall. Support and encouragement offered, safety maintained on unit, will continue to monitor.  

## 2012-05-20 NOTE — Clinical Social Work Note (Signed)
[facility] Group Notes: (Clinical Social Work)   05/20/2012 3:00-4:00PM   Type of Therapy:  Group Therapy   Participation Level:  Did Not Attend    Ambrose Mantle, LCSW 05/20/2012, 5:15 PM

## 2012-05-20 NOTE — Progress Notes (Signed)
Patient has been up in the dayroom interacting appropriately with select peers. Patient requested that his finger be re-bandaged and ibuprofen for forehead pain. Patient was given ibuprofen, was preparing to attned group. Patient currently denies si/hi/a/v hall. Support and encouragement offered, safety maintained with 15 min checks, will continue to monitor.

## 2012-05-20 NOTE — Progress Notes (Signed)
Psychoeducational Group Note  Date:  05/20/2012 Time   1015  Group Topic/Focus:  Identifying Needs:   The focus of this group is to help patients identify their personal needs that have been historically problematic and identify healthy behaviors to address their needs.  Participation Level: Did Not Attend  Participation Quality:  Not Applicable  Affect:  Not Applicable  Cognitive:  Not Applicable  Insight:  Not Applicable  Engagement in Group: Not Applicable  Additional Comments:    [name] 05/20/2012, 2:03 PM

## 2012-05-21 NOTE — Clinical Social Work Note (Signed)
[facility] Group Notes:  (Clinical Social Work)  05/21/2012   3:00-4:00PM  Summary of Progress/Problems:   The main focus of today's process group was for the patient to define "support" and describe what healthy supports are, then to identify the patient's current support system and decide on other supports that can be put in place to prevent future hospitalizations.  An emphasis was placed on using therapist, doctor and problem-specific support groups to expand supports. The patient expressed that his older brother is the only person he has had in the last year, and he has been very supportive.  Although they were not close when they were young, their relationship has become close since the death of their parents.  He is interested in support groups.  Type of Therapy:  Group Therapy  Participation Level:  Active  Participation Quality:  Appropriate, Attentive and Sharing  Affect:  Appropriate  Cognitive:  Alert, Appropriate and Oriented  Insight:  Good  Engagement in Group:  Good  Engagement in Therapy:  Good  Modes of Intervention:  Clarification, Education, Limit-setting, Problem-solving, Socialization, Support and Processing   Ambrose Mantle, LCSW 05/21/2012,

## 2012-05-21 NOTE — Progress Notes (Signed)
Goals Group Note  Date:  11/24//2013 Time:  0900  Group Topic/Focus:  Identifying Goals: The focus of this group is to help the patients identify personal goals they want to achieve, to introduce them to their Sunday Patient Workbooks and to motivate them to begin to take the steps in achieving their goals and becoming healthier individuals.  Participation Level:  active Participation Quality: good Affect: flat Cognitive:    Insight:  good  Engagement in Group: engaged  Additional Comments:   PD RN [facility] [id number]

## 2012-05-21 NOTE — Progress Notes (Signed)
Psychoeducational Group Note  Date:  05/21/2012 Time: 1015 Group Topic/Focus:  Making Healthy Choices:   The focus of this group is to help patients identify negative/unhealthy choices they were using prior to admission and identify positive/healthier coping strategies to replace them upon discharge.  Participation Level:  Active  Participation Quality:  Appropriate  Affect:  Blunted  Cognitive:  Alert  Insight:  Good  Engagement in Group:  Limited  Additional Comments:    [name] 5:36 PM. 05/21/2012
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
## 2012-05-21 NOTE — Progress Notes (Signed)
Frank [name] is seen OOB UAL on the hall this morning. HE remains sad, depressed, makes poor eye contact and he says he " can't notice any difference" in how he feels...yet ( from anti depressants). He completes his AM self inventory and on it he writes " I don't know" interms of his DC planning..he rates his depression and hopelessness "4/4" respectvely and denies feelings of SI within the past 24 hrs.  A He takes his AM meds as scheduled and is currently attending his morning group as planned.  R Safety is in place and POC fostered with therapeutic relationship.

## 2012-05-21 NOTE — Progress Notes (Signed)
Patient has been up and active on the unit, attended group this evening and has voiced no complaints. Patient currently denies having pain, -si/hi/a/v hall. Support and encouragement offered, safety maintained on unit, will continue to monitor.  

## 2012-05-22 MED ORDER — FLUOXETINE HCL 20 MG PO CAPS
20.0000 mg | ORAL_CAPSULE | Freq: Every day | ORAL | Status: DC
  Administered 2012-05-23: 20 mg via ORAL
  Filled 2012-05-22: qty 1
  Filled 2012-05-22: qty 14
  Filled 2012-05-22: qty 1

## 2012-05-22 NOTE — Progress Notes (Signed)
Psychoeducational Group Note  Date:  05/22/2012 Time:  1100  Group Topic/Focus:  Self Care:   The focus of this group is to help patients understand the importance of self-care in order to improve or restore emotional, physical, spiritual, interpersonal, and financial health.  Participation Level:  None  Participation Quality:  Appropriate, Attentive and Resistant  Affect:  Appropriate  Cognitive:  Appropriate  Insight:  None  Engagement in Group:  None  Additional Comments:  Patient attended group and was asked to share during group. Patient would not speak on defining self care and did not complete self care assessment that was given to patient in workbook. Hezzie was asked to give feedback during group on ways to improve or restore emotional, physical, spiritual, interpersonal, and financial health and refused.  Karleen Hampshire Brittini 05/22/2012, 1:37 PM

## 2012-05-22 NOTE — Clinical Social Work Note (Signed)
Discharge Planning Group 8:30-9:30 AM 05/22/2012 Patient seen during discharge planning group.  He reports being better and looks forward to discharging soon.  He advised his brother is paying for him to be in a hotel until he can go for his appointment at [facility] on 12/5. Patient denies SI/HI and rates symptoms at one.       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  He stated coming back into the hospital is an effort to removing the obstacle of not moving forward.   Wynn Banker 05/22/2012 3:08 PM

## 2012-05-22 NOTE — Progress Notes (Signed)
Psychoeducational Group Note  Date:  05/22/2012 Time:  2030  Group Topic/Focus:  Wrap-Up Group:   The focus of this group is to help patients review their daily goal of treatment and discuss progress on daily workbooks.  Participation Level:  Active  Participation Quality:  Sharing  Affect:  Depressed  Cognitive:  Appropriate  Insight:  Good  Engagement in Group:  Good  Additional Comments:  Patient shared that he was ready to go home but that the doctor told him a couple more days at the hospital might be necessary.  Suresh Audi, Newton Pigg 05/22/2012, 11:46 PM

## 2012-05-22 NOTE — Progress Notes (Signed)
D:  Micharl reports that he slept poorly, but his appetite is good.  He rates depression and hopelessness at 4/10.  He denies SI/HI/AVH at this time.  He is going to groups and is interacting with staff and other patients, but he appears with a flat affect and depressed mood.   A:  Medications given as ordered, emotional support provided.  Safety checks q 15 minutes. R:  Safety maintained on unit.

## 2012-05-22 NOTE — Progress Notes (Signed)
[facility] MD Progress Note  05/22/2012 11:18 AM Frank [name]  MRN:  [id number]  S: Patient states feeling better. His withdrawal symptoms are minimal. Continues to have trouble sleeping. His brother is being very supportive and helping him with discharge planning and with housing.  Diagnosis:  Axis I: Depressive Disorder NOS, Cocaine Abuse  ADL's:  Intact  Sleep: Poor  Appetite:  Fair   Mental Status Examination/Evaluation: Objective:  Appearance: Casual  Eye Contact::  Fair  Speech:  Slow  Volume:  Decreased  Mood:  Anxious and Dysphoric  Affect:  Blunt and Constricted  Thought Process:  Coherent  Orientation:  Full  Thought Content:  WDL  Suicidal Thoughts:  No  Homicidal Thoughts:  No  Memory:  Immediate;   Fair Recent;   Fair Remote;   Fair  Judgement:  Fair  Insight:  Present  Psychomotor Activity:  Normal  Concentration:  Fair  Recall:  Fair  Akathisia:  No  Handed:  Right  AIMS (if indicated):     Assets:  Communication Skills Social Support  Sleep:  Number of Hours: 5    Vital Signs:Blood pressure 108/73, pulse 90, temperature 97.6 F (36.4 C), temperature source Oral, resp. rate 18, height 5\' 7"  (1.702 m), weight 87.544 kg (193 lb). Current Medications: Current Facility-Administered Medications  Medication Dose Route Frequency Provider Last Rate Last Dose  . acetaminophen (TYLENOL) tablet 650 mg  650 mg Oral Q6H PRN Verne Spurr, PA-C      . alum & mag hydroxide-simeth (MAALOX/MYLANTA) 200-200-20 MG/5ML suspension 30 mL  30 mL Oral Q4H PRN Verne Spurr, PA-C      . FLUoxetine (PROZAC) capsule 20 mg  20 mg Oral Daily Hussain Maimone, MD      . ibuprofen (ADVIL,MOTRIN) tablet 600 mg  600 mg Oral Q6H PRN Asley Baskerville, MD   600 mg at 05/19/12 2011  . QUEtiapine (SEROQUEL) tablet 800 mg  800 mg Oral QHS Verne Spurr, PA-C   800 mg at 05/21/12 2151  . [DISCONTINUED] FLUoxetine (PROZAC) capsule 10 mg  10 mg Oral Daily Roldan Laforest, MD   10 mg at 05/22/12 0759      Lab Results: No results found for this or any previous visit (from the past 48 hour(s)).  Physical Findings: AIMS: Facial and Oral Movements Muscles of Facial Expression: None, normal Lips and Perioral Area: None, normal Jaw: None, normal Tongue: None, normal,Extremity Movements Upper (arms, wrists, hands, fingers): None, normal Lower (legs, knees, ankles, toes): None, normal, Trunk Movements Neck, shoulders, hips: None, normal, Overall Severity Severity of abnormal movements (highest score from questions above): None, normal Incapacitation due to abnormal movements: None, normal Patient's awareness of abnormal movements (rate only patient's report): No Awareness, Dental Status Current problems with teeth and/or dentures?: No Does patient usually wear dentures?: No  CIWA:  CIWA-Ar Total: 1  COWS:  COWS Total Score: 0   Treatment Plan Summary: Daily contact with patient to assess and evaluate symptoms and progress in treatment Medication management  Plan: Increase Prozac to 20mg . Plan for discharge tomorrow.  Aydn Ferrara 05/22/2012, 11:18 AM

## 2012-05-22 NOTE — Tx Team (Signed)
Interdisciplinary Treatment Plan Update (Adult)  Date:  05/22/2012  Time Reviewed:  9:38 AM   Progress in Treatment: Attending groups:   Yes   Participating in groups:  Yes Taking medication as prescribed:  Yes Tolerating medication:  Yes Family/Significant othe contact made: Contact made with family Patient understands diagnosis:  Yes Discussing patient identified problems/goals with staff: Yes Medical problems stabilized or resolved: Yes Denies suicidal/homicidal ideation:Yes Issues/concerns per patient self-inventory:  Other:   New problem(s) identified:  Reason for Continuation of Hospitalization: Anxiety Depression Medication stabilization  Interventions implemented related to continuation of hospitalization:  Medication Management; safety checks q 15 mins  Additional comments:  Estimated length of stay: 1-2 days  Discharge Plan:  Home with outpatient follow up  New goal(s):  Review of initial/current patient goals per problem list:    1.  Goal(s): Eliminate SI/other thoughts of self harm   Met:  Yes  Target date: d/c  As evidenced by: Patient no longer endorsing SI/HI or other thoughts of self harm.    2.  Goal (s):Reduce depression/anxiety  Met: Yes  Target date: d/c  As evidenced by: Patient currently rating symptoms at four or below    3.  Goal(s):.stabilize on meds   Met:  Yes  Target date: d/c  As evidenced by: Patient reports being stabilized on medications - less symptomatic    4.  Goal(s): Refer for outpatient follow up   Met:  Yes  Target date: d/c  As evidenced by: Follow up appointment to be scheduled    Attendees: Patient:   05/22/2012 9:38 AM  Physican:  Patrick North, MD 05/22/2012 9:38 AM  Nursing:  Neill Loft, RN 05/22/2012 9:38 AM   Nursing:   Chinita Greenland, RN 05/22/2012 9:38 AM   Clinical Social Worker:  Juline Patch, LCSW 05/22/2012 9:38 AM   Other: Serena Colonel, FNP 05/22/2012 9:38 AM

## 2012-05-23 MED ORDER — FLUOXETINE HCL 20 MG PO CAPS: 20.0000 mg | ORAL_CAPSULE | Freq: Every day | ORAL | Status: DC

## 2012-05-23 MED ORDER — QUETIAPINE FUMARATE 400 MG PO TABS: 800.0000 mg | ORAL_TABLET | Freq: Every day | ORAL | Status: DC

## 2012-05-23 NOTE — Progress Notes (Signed)
Patient ID: Frank [name], male   DOB: Dec 15, 1950, 61 y.o.   MRN: [id number] He has been discharged home. He voiced understanding of discharge instruction and of follow up plan. He denies thought of SI and all belonging taken home with him.

## 2012-05-23 NOTE — Progress Notes (Signed)
Psychoeducational Group Note  Date:  05/23/2012 Time: 1100  Group Topic/Focus:  Recovery Goals:   The focus of this group is to identify appropriate goals for recovery and establish a plan to achieve them.  Participation Level:  Active  Participation Quality:  Appropriate, Attentive and Sharing  Affect:  Appropriate  Cognitive:  Appropriate  Insight:  Good  Engagement in Group:  Good  Additional Comments: Nedra Hai attended group and discussed information about what recovery was and what recovery would look like in his life. Calyx was appropriate and did not work in his workbook during group and would speak when asked.  Karleen Hampshire Brittini 05/23/2012, 1:39 PM

## 2012-05-23 NOTE — Clinical Social Work Note (Signed)
Patient seen during d/c planning group.  He reports doing better and denies SI/HI and all other symptoms.  He is looking forward to going to [facility] on 06/01/12.  Patient decline consent to review suicide prevention education with brother because he lives in [place] and will not be seeing him anytime soon.  In addition, he stated brother is a retired Emergency planning/management officer and would know what to do in the event of a crisis.

## 2012-05-23 NOTE — Progress Notes (Signed)
[facility] Adult Inpatient Family/Significant Other Suicide Prevention Education  Suicide Prevention Education:  Patient Refusal for Family/Significant Other Suicide Prevention Education: The patient Frank [name] has refused to provide written consent for family/significant other to be provided Family/Significant Other Suicide Prevention Education during admission and/or prior to discharge.   Patient declined due to not having any family living locally with whom he will have contact.  Physician notified.  Wynn Banker 05/23/2012, 10:08 AM

## 2012-05-23 NOTE — [facility] Suicide Risk Assessment (Signed)
Suicide Risk Assessment  Discharge Assessment     Demographic Factors:  Male, Caucasian, Low socioeconomic status, Living alone and Unemployed  Mental Status Per Nursing Assessment::   On Admission:  Self-harm thoughts  Current Mental Status by Physician: Alert and oriented to 4. Denies AH/VH/SI/HI.  Loss Factors: Decrease in vocational status, Loss of significant relationship and Financial problems/change in socioeconomic status  Historical Factors: Family history of mental illness or substance abuse and Impulsivity  Risk Reduction Factors:   Positive social support  Continued Clinical Symptoms:  Depression:   Recent sense of peace/wellbeing  Cognitive Features That Contribute To Risk:  Cognitively intact   Suicide Risk:  Minimal: No identifiable suicidal ideation.  Patients presenting with no risk factors but with morbid ruminations; may be classified as minimal risk based on the severity of the depressive symptoms  Discharge Diagnoses:   AXIS I:  Depressive Disorder NOS AXIS II:  No diagnosis AXIS III:   Past Medical History  Diagnosis Date  . Anxiety   . Depression   . Drug addiction    AXIS IV:  economic problems, housing problems and occupational problems AXIS V:  61-70 mild symptoms  Plan Of Care/Follow-up recommendations:  Activity:  normal Diet:  normal Follow up with outpatient appointments.  Is patient on multiple antipsychotic therapies at discharge:  No   Has Patient had three or more failed trials of antipsychotic monotherapy by history:  No  Recommended Plan for Multiple Antipsychotic Therapies: NA  Frank [name] 05/23/2012, 9:05 AM

## 2012-05-23 NOTE — Progress Notes (Addendum)
[facility] Case Management Discharge Plan:  Will you be returning to the same living situation after discharge: No.  Brother has arranged a hotel room for patient until assessment at [facility] At discharge, do you have transportation home?:No.  Patient assisted with bus pass Do you have the ability to pay for your medications:No.  Patient assisted with indigent  Interagency Information:     Release of information consent forms completed and in the chart;  Patient's signature needed at discharge.  Patient to Follow up at:  You are scheduled to see Dr. Ladona Ridgel  on Wednesday, May 24, 2012 at 10:00 AM.   [facility]er   [phone] [address] [place], [place]  [address]  Follow-up Information    Follow up with [facility]. On 06/01/2012.   Contact information:   [address] [place], [place]  [address]  [phone]         Patient denies SI/HI:   Yes,  Patient no longer endorses SI/HI or other thoughts of self harm    Safety Planning and Suicide Prevention discussed:  Yes,  Reviewed during aftercare groups.  Barrier to discharge identified:  Yes. Limited support system, unemployment, and no permanent housing.  Summary and Recommendations:  Patient encouraged to be compliant with medications and follow up with outpatient recommendations    Wynn Banker 05/23/2012, 10:09 AM
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
## 2012-05-23 NOTE — Progress Notes (Signed)
Psychoeducational Group Note  Date:  05/23/2012 Time:  1000  Group Topic/Focus:  Recovery Goals:   The focus of this group is to identify appropriate goals for recovery and establish a plan to achieve them.  Participation Level:  Active  Participation Quality:  Appropriate, Sharing and Supportive  Affect:  Appropriate  Cognitive:  Appropriate  Insight:  Good  Engagement in Group:  Good  Additional Comments: none  Seletha Zimmermann M 05/23/2012, 11:11 AM

## 2012-05-26 ENCOUNTER — Encounter (HOSPITAL_COMMUNITY): Payer: Self-pay | Admitting: Emergency Medicine

## 2012-05-26 ENCOUNTER — Emergency Department (HOSPITAL_COMMUNITY)
Admission: EM | Admit: 2012-05-26 | Discharge: 2012-05-26 | Disposition: A | Discharge status: Home / Self Care | Payer: Self-pay | Attending: Emergency Medicine | Admitting: Emergency Medicine

## 2012-05-26 DIAGNOSIS — IMO0002 Reserved for concepts with insufficient information to code with codable children: Secondary | ICD-10-CM

## 2012-05-26 DIAGNOSIS — F329 Major depressive disorder, single episode, unspecified: Secondary | ICD-10-CM | POA: Insufficient documentation

## 2012-05-26 DIAGNOSIS — Z79899 Other long term (current) drug therapy: Secondary | ICD-10-CM | POA: Insufficient documentation

## 2012-05-26 DIAGNOSIS — Z4800 Encounter for change or removal of nonsurgical wound dressing: Secondary | ICD-10-CM | POA: Insufficient documentation

## 2012-05-26 DIAGNOSIS — F411 Generalized anxiety disorder: Secondary | ICD-10-CM | POA: Insufficient documentation

## 2012-05-26 DIAGNOSIS — F3289 Other specified depressive episodes: Secondary | ICD-10-CM | POA: Insufficient documentation

## 2012-05-26 NOTE — ED Provider Notes (Signed)
History   This chart was scribed for Benny Lennert, MD by Sofie Rower, ED Scribe. The patient was seen in room TR06C/TR06C and the patient's care was started at 2:33PM.     CSN: [id number]  Arrival date & time 05/26/12  1421   First MD Initiated Contact with Patient 05/26/12 1433      Chief Complaint  Patient presents with  . Wound Check    (Consider location/radiation/quality/duration/timing/severity/associated sxs/prior treatment) Patient is a 61 y.o. male presenting with wound check. The history is provided by the patient. No language interpreter was used.  Wound Check  He was treated in the ED 10 to 14 days ago. Previous treatment in the ED includes laceration repair. Treatments since wound repair include regular soap and water washings. There has been no drainage from the wound. There is no redness present. There is no swelling present. The pain has no pain. He has no difficulty moving the affected extremity or digit.    Past Medical History  Diagnosis Date  . Anxiety   . Depression   . Drug addiction     History reviewed. No pertinent past surgical history.  No family history on file.  History  Substance Use Topics  . Smoking status: Never Smoker   . Smokeless tobacco: Not on file  . Alcohol Use: 0.6 oz/week    1 Cans of beer per week     Comment: PAIN PILLS--PERCOCET/VICODIN      Review of Systems  Constitutional: Negative for fatigue.  HENT: Negative for congestion, sinus pressure and ear discharge.   Eyes: Negative for discharge.  Respiratory: Negative for cough.   Cardiovascular: Negative for chest pain.  Gastrointestinal: Negative for abdominal pain and diarrhea.  Genitourinary: Negative for frequency and hematuria.  Musculoskeletal: Negative for back pain.  Skin: Negative for rash.  Neurological: Negative for seizures and headaches.  Hematological: Negative.   Psychiatric/Behavioral: Negative for hallucinations.    Allergies  Review of patient's  allergies indicates no known allergies.  Home Medications   Current Outpatient Rx  Name  Route  Sig  Dispense  Refill  . FLUOXETINE HCL 20 MG PO CAPS   Oral   Take 1 capsule (20 mg total) by mouth daily.   30 capsule   0   . MELOXICAM 7.5 MG PO TABS   Oral   Take 7.5 mg by mouth 2 (two) times daily.         . QUETIAPINE FUMARATE 400 MG PO TABS   Oral   Take 2 tablets (800 mg total) by mouth at bedtime.   60 tablet   0     BP 134/88  Pulse 94  Temp 97.4 F (36.3 C) (Oral)  Resp 16  SpO2 100%  Physical Exam  Nursing note and vitals reviewed. Constitutional: He is oriented to person, place, and time. He appears well-developed.  HENT:  Head: Normocephalic.       Healed laceration to forehead, healed laceration to the scalp.   Eyes: Conjunctivae normal are normal.  Neck: No tracheal deviation present.  Cardiovascular:  No murmur heard. Musculoskeletal: Normal range of motion.       Healed laceration to the right 5th digit.   Neurological: He is oriented to person, place, and time.  Skin: Skin is warm.  Psychiatric: He has a normal mood and affect.    ED Course  Procedures (including critical care time)  DIAGNOSTIC STUDIES: Oxygen Saturation is 100% on room air, normal by my interpretation.  COORDINATION OF CARE:   2:46 PM- Treatment plan concerning suture removal discussed with patient. Pt agrees with treatment.     Labs Reviewed - No data to display No results found.   No diagnosis found.    MDM       The chart was scribed for me under my direct supervision.  I personally performed the history, physical, and medical decision making and all procedures in the evaluation of this patient.Benny Lennert, MD 05/26/12 [phone]
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
## 2012-05-29 NOTE — Progress Notes (Signed)
Patient Discharge Instructions:  After Visit Summary (AVS):   Faxed to:  05/29/12 Psychiatric Admission Assessment Note:   Faxed to:  05/29/12 Suicide Risk Assessment - Discharge Assessment:   Faxed to:  05/29/12 Faxed/Sent to the Next Level Care provider:  05/29/12 Faxed to [facility] @ [phone] Faxed to [facility] @ [phone]  [name], 05/29/2012, 2:53 PM

## 2012-05-30 NOTE — Discharge Summary (Signed)
Patient was started on Prozac during his hospitalization and responded well. Mood symptoms improved significantly and Prozac was titrated to 20mg  po qd. He was discontinued on the Amitriptyl;ine and Fluvoxamine.

## 2013-06-28 HISTORY — PX: SKIN CANCER EXCISION: SHX779

## 2016-08-14 ENCOUNTER — Emergency Department (HOSPITAL_COMMUNITY)
Admission: EM | Admit: 2016-08-14 | Discharge: 2016-08-15 | Disposition: A | Discharge status: Other Acute Inpatient Hospital | Payer: Federal, State, Local not specified - Other | Attending: Emergency Medicine | Admitting: Emergency Medicine

## 2016-08-14 ENCOUNTER — Encounter (HOSPITAL_COMMUNITY): Payer: Self-pay | Admitting: Emergency Medicine

## 2016-08-14 DIAGNOSIS — F419 Anxiety disorder, unspecified: Secondary | ICD-10-CM

## 2016-08-14 DIAGNOSIS — F418 Other specified anxiety disorders: Secondary | ICD-10-CM | POA: Insufficient documentation

## 2016-08-14 DIAGNOSIS — F32A Depression, unspecified: Secondary | ICD-10-CM

## 2016-08-14 DIAGNOSIS — F329 Major depressive disorder, single episode, unspecified: Secondary | ICD-10-CM | POA: Diagnosis present

## 2016-08-14 DIAGNOSIS — Z79899 Other long term (current) drug therapy: Secondary | ICD-10-CM | POA: Insufficient documentation

## 2016-08-14 LAB — CBC WITH DIFFERENTIAL/PLATELET
Basophils Absolute: 0.1 10*3/uL (ref 0.0–0.1)
Basophils Relative: 1 %
EOS ABS: 0 10*3/uL (ref 0.0–0.7)
Eosinophils Relative: 0 %
HCT: 46.5 % (ref 39.0–52.0)
HEMOGLOBIN: 16.7 g/dL (ref 13.0–17.0)
Lymphocytes Relative: 16 %
Lymphs Abs: 1.3 10*3/uL (ref 0.7–4.0)
MCH: 29.5 pg (ref 26.0–34.0)
MCHC: 35.9 g/dL (ref 30.0–36.0)
MCV: 82 fL (ref 78.0–100.0)
Monocytes Absolute: 0.8 10*3/uL (ref 0.1–1.0)
Monocytes Relative: 9 %
NEUTROS PCT: 74 %
Neutro Abs: 6 10*3/uL (ref 1.7–7.7)
Platelets: 213 10*3/uL (ref 150–400)
RBC: 5.67 MIL/uL (ref 4.22–5.81)
RDW: 12.9 % (ref 11.5–15.5)
WBC: 8.1 10*3/uL (ref 4.0–10.5)

## 2016-08-14 LAB — RAPID URINE DRUG SCREEN, HOSP PERFORMED
AMPHETAMINES: NOT DETECTED
BARBITURATES: NOT DETECTED
Benzodiazepines: NOT DETECTED
Cocaine: NOT DETECTED
Opiates: NOT DETECTED
TETRAHYDROCANNABINOL: NOT DETECTED

## 2016-08-14 LAB — I-STAT CHEM 8, ED
BUN: 15 mg/dL (ref 6–20)
CALCIUM ION: 1.04 mmol/L — AB (ref 1.15–1.40)
CHLORIDE: 103 mmol/L (ref 101–111)
CREATININE: 0.8 mg/dL (ref 0.61–1.24)
GLUCOSE: 119 mg/dL — AB (ref 65–99)
HCT: 50 % (ref 39.0–52.0)
Hemoglobin: 17 g/dL (ref 13.0–17.0)
Potassium: 3.7 mmol/L (ref 3.5–5.1)
Sodium: 139 mmol/L (ref 135–145)
TCO2: 22 mmol/L (ref 0–100)

## 2016-08-14 LAB — ETHANOL: Alcohol, Ethyl (B): <5 mg/dL (ref <5)

## 2016-08-14 MED ORDER — HYDROXYZINE HCL 25 MG PO TABS
25.0000 mg | ORAL_TABLET | Freq: Three times a day (TID) | ORAL | Status: DC | PRN
  Administered 2016-08-14 – 2016-08-15 (×2): 25 mg via ORAL
  Filled 2016-08-14 (×2): qty 1

## 2016-08-14 MED ORDER — SERTRALINE HCL 50 MG PO TABS
50.0000 mg | ORAL_TABLET | Freq: Every day | ORAL | Status: DC
  Administered 2016-08-14 – 2016-08-15 (×2): 50 mg via ORAL
  Filled 2016-08-14 (×2): qty 1

## 2016-08-14 MED ORDER — LORAZEPAM 1 MG PO TABS
1.0000 mg | ORAL_TABLET | Freq: Once | ORAL | Status: AC
  Administered 2016-08-14: 1 mg via ORAL
  Filled 2016-08-14: qty 1

## 2016-08-14 MED ORDER — TRAZODONE HCL 50 MG PO TABS
50.0000 mg | ORAL_TABLET | Freq: Every evening | ORAL | Status: DC | PRN
  Administered 2016-08-14: 50 mg via ORAL
  Filled 2016-08-14: qty 1

## 2016-08-14 NOTE — BH Assessment (Signed)
Assessment Note  Frank [name] is an 66 y.o. male that presents this date voluntary brought in by brother (patient declines to give family name) due to passive thoughts of self harm without a plan. Patient is very anxious and renders limited history stating 'the last time I was honest with a counselor I was put in the hospital for three months." Patient states he has been residing with his brother and has recently just realized "how bad his life was." Patient reports ongoing depression with symptoms to include: hopelessness, isolating (pt states he has not been out of his room in one week) and excessive guilt. Patient states since he has lost his employment within the last month, he has relocated to reside with his brother. Patient stated his brother convinced him to seek help this date after patient has been isolating and "drinking every day" or he would be asked to leave. Patient reports daily alcohol use for the last two months stating he consumes "3 or 4 drinks (liquor) every day." Patient denies any current withdrawals. Patient states he has used multiple substances in the past but denies any current use stating "I am just drinking myself to death." Patient admits to one previous attempt at self harm but would not elaborate on time period or details of that incident. Patient is very vague in reference to treatment history but reports multiple admission for MH/SA issues in the past. Patient denies being on any MH medications for the last five years. Patient states he was admitted to [facility] in 2013 for depression and SA issues. Patient also reported he was in [facility] for three months prior to 2013. Patient will not elaborate on that admission. Patient denies any H/I or AVH this date. Patient is oriented to time/place. Patient is vague in reference to current S/I stating "I said enough I am over all of it." Admission note stated: "Per family member, the patient has been "having a breakdown" over the last several weeks.  Family member states that this happened 5-6 years ago when the patient was undergoing major financial problems. Family member states that patient does not bathe or take care of himself and he just lays in his room. He also states that the patient talks about how he is a monster, has hurt too many people, and wants to disappear. Pt denies SI or HI to family member & states that he doesn't have the motivation.  When the nurse inquired, the patient was not responding and just closing his eyes and shaking his head".Case was staffed with Meryl Crutch NP who recommended patient be re-evaluated in the a.m.  Diagnosis: MDD without psychotic features severe, GAD, ETOH abuse severe  Past Medical History:  Past Medical History:  Diagnosis Date  . Anxiety   . Depression   . Drug addiction ([place])     History reviewed. No pertinent surgical history.  Family History: History reviewed. No pertinent family history.  Social History:  reports that he has never smoked. He does not have any smokeless tobacco history on file. He reports that he drinks about 0.6 oz of alcohol per week . He reports that he uses drugs, including Other-see comments, "Crack" cocaine, and Cocaine.  Additional Social History:  Alcohol / Drug Use Pain Medications: Yes; see MAR Prescriptions: Yes Over the Counter: Yes; see MAR History of alcohol / drug use?: Yes Longest period of sobriety (when/how long): 12 years Negative Consequences of Use: Financial, Personal relationships, Legal Withdrawal Symptoms:  (Denies) Substance #1 Name of Substance  1: ETOH 1 - Age of First Use: 27 1 - Amount (size/oz): 3 to 4 oz daily 1 - Frequency: daily 1 - Duration: Last year  1 - Last Use / Amount: 08/13/16 4 oz (3 drinks liquor0  CIWA: CIWA-Ar BP: 160/99 Pulse Rate: 85 COWS:    Allergies: No Known Allergies  Home Medications:  (Not in a hospital admission)  OB/GYN Status:  No LMP for male patient.  General Assessment Data Location of  Assessment: [facility] ED TTS Assessment: In system Is this a Tele or Face-to-Face Assessment?: Face-to-Face Is this an Initial Assessment or a Re-assessment for this encounter?: Initial Assessment Marital status: Single Maiden name: na Is patient pregnant?: No Pregnancy Status: No Living Arrangements: Other relatives Can pt return to current living arrangement?: Yes Admission Status: Voluntary Is patient capable of signing voluntary admission?: Yes Referral Source: Self/Family/Friend Insurance type: Self Pay  Medical Screening Exam ([place]) Medical Exam completed: Yes  Crisis Care Plan Living Arrangements: Other relatives Legal Guardian:  (na) Name of Psychiatrist: None Name of Therapist: None  Education Status Is patient currently in school?: No Current Grade: na Highest grade of school patient has completed: 12 Name of school: na Contact person: na  Risk to self with the past 6 months Suicidal Ideation: Yes-Currently Present (passive S/I) Has patient been a risk to self within the past 6 months prior to admission? : No Suicidal Intent: No Has patient had any suicidal intent within the past 6 months prior to admission? : No Is patient at risk for suicide?: No Suicidal Plan?: No Has patient had any suicidal plan within the past 6 months prior to admission? : No Access to Means: No What has been your use of drugs/alcohol within the last 12 months?: Current use Previous Attempts/Gestures: Yes How many times?: 1 Other Self Harm Risks: na Triggers for Past Attempts: Unknown Intentional Self Injurious Behavior: None Family Suicide History: No Recent stressful life event(s): Other (Comment) (loss of employment) Persecutory voices/beliefs?: No Depression: Yes Depression Symptoms: Isolating, Fatigue Substance abuse history and/or treatment for substance abuse?: No Suicide prevention information given to non-admitted patients: Not applicable  Risk to Others within the  past 6 months Homicidal Ideation: No Does patient have any lifetime risk of violence toward others beyond the six months prior to admission? : No Thoughts of Harm to Others: No Current Homicidal Intent: No Current Homicidal Plan: No Access to Homicidal Means: No Identified Victim: na History of harm to others?: No Assessment of Violence: None Noted Violent Behavior Description: na Does patient have access to weapons?: No Criminal Charges Pending?: No Does patient have a court date: No Is patient on probation?: No  Psychosis Hallucinations: None noted Delusions: None noted  Mental Status Report Appearance/Hygiene: In scrubs Eye Contact: Fair Motor Activity: Freedom of movement Speech: Pressured Level of Consciousness: Irritable Mood: Anxious Affect: Anxious Anxiety Level: Moderate Thought Processes: Coherent, Relevant Judgement: Unimpaired Orientation: Person, Place, Time Obsessive Compulsive Thoughts/Behaviors: None  Cognitive Functioning Concentration: Decreased Memory: Recent Intact, Remote Intact IQ: Average Insight: Fair Impulse Control: Fair Appetite: Fair Weight Loss: 0 Weight Gain: 0 Sleep: Decreased Total Hours of Sleep: 5 Vegetative Symptoms: None  ADLScreening [facility] Assessment Services) Patient's cognitive ability adequate to safely complete daily activities?: Yes Patient able to express need for assistance with ADLs?: Yes Independently performs ADLs?: Yes (appropriate for developmental age)  Prior Inpatient Therapy Prior Inpatient Therapy: Yes Prior Therapy Dates: 2013 Prior Therapy Facilty/Provider(s): [facility] Reason for Treatment: MH issues  Prior  Outpatient Therapy Prior Outpatient Therapy: Yes Prior Therapy Dates: 2012 Prior Therapy Facilty/Provider(s): [facility] Reason for Treatment: MH issues Does patient have an ACCT team?: No Does patient have Intensive In-House Services?  : No Does patient have [facility] services? : No Does patient have P4CC  services?: No  ADL Screening (condition at time of admission) Patient's cognitive ability adequate to safely complete daily activities?: Yes Is the patient deaf or have difficulty hearing?: No Does the patient have difficulty seeing, even when wearing glasses/contacts?: No Does the patient have difficulty concentrating, remembering, or making decisions?: No Patient able to express need for assistance with ADLs?: Yes Does the patient have difficulty dressing or bathing?: No Independently performs ADLs?: Yes (appropriate for developmental age) Does the patient have difficulty walking or climbing stairs?: No Weakness of Legs: None Weakness of Arms/Hands: None  Home Assistive Devices/Equipment Home Assistive Devices/Equipment: None  Therapy Consults (therapy consults require a physician order) PT Evaluation Needed: No OT Evalulation Needed: No SLP Evaluation Needed: No Abuse/Neglect Assessment (Assessment to be complete while patient is alone) Physical Abuse: Denies Verbal Abuse: Denies Sexual Abuse: Denies Exploitation of patient/patient's resources: Denies Self-Neglect: Denies Values / Beliefs Cultural Requests During Hospitalization: None Spiritual Requests During Hospitalization: None Consults Spiritual Care Consult Needed: No Social Work Consult Needed: No Regulatory affairs officer (For Healthcare) Does Patient Have a Medical Advance Directive?: No Would patient like information on creating a medical advance directive?: No - Patient declined    Additional Information 1:1 In Past 12 Months?: No CIRT Risk: No Elopement Risk: No Does patient have medical clearance?: Yes     Disposition: .Case was staffed with Meryl Crutch NP who recommended patient be re-evaluated in the a.m.  Disposition Initial Assessment Completed for this Encounter: Yes Disposition of Patient: Other dispositions Other disposition(s): Other (Comment) (pt will be re-evaluated in the a.m.)  On Site Evaluation  by:   Reviewed with Physician:    Mamie Nick 08/14/2016 6:19 PM

## 2016-08-14 NOTE — ED Notes (Signed)
Pt hyper focused on medications asking if he  requesting "everything I can get." Pt encouraged to sleep using non-pharmaceutical methods. Pt was not interested in hearing writer's suggestion.

## 2016-08-14 NOTE — ED Notes (Signed)
Brother of pt had to return home to [place]. He requests that staff call him if pt is released.  He also requested some numbers bet put into his chart in case he needed to get in touch with anyone.  Clementeen Hoof - brother: [place] -fiance: [place] - roommates: [phone].

## 2016-08-14 NOTE — ED Provider Notes (Signed)
[place] DEPT Provider Note   CSN: [id number] Arrival date & time: 08/14/16  1235     History   Chief Complaint Chief Complaint  Patient presents with  . Depression    HPI Frank [name] is a 66 y.o. male.  The history is provided by the patient.  Depression  This is a recurrent problem. Episode onset: 2 weeks. The problem occurs constantly. The problem has been gradually worsening. Associated symptoms comments: No eating, sleeping and has been saying his life is in [place] and it would be better if he is dead.  Asked his brother to shoot him.  Per patient's brother 2 weeks ago patient took his ex-wife to the hospital to be committed for mental health issues. This was because the patient in this manage some finances and she now lost her home. He has been extremely guilty and locked himself into his bedroom. Brother states that this happened about 5 years ago when he required psychiatric admission and was on antidepressants for a while and had done well for 4 years until now. The symptoms are aggravated by stress. Nothing relieves the symptoms. He has tried nothing for the symptoms. The treatment provided no relief.    Past Medical History:  Diagnosis Date  . Anxiety   . Depression   . Drug addiction [facility])     Patient Active Problem List   Diagnosis Date Noted  . MDD (major depressive disorder) 05/20/2012  . Cocaine abuse 05/20/2012    Class: Chronic  . Chronic pain associated with significant psychosocial dysfunction 04/01/2012  . Anxiety   . Depression   . Drug addiction ([place])     History reviewed. No pertinent surgical history.     Home Medications    Prior to Admission medications   Medication Sig Start Date End Date Taking? Authorizing Provider  FLUoxetine (PROZAC) 20 MG capsule Take 1 capsule (20 mg total) by mouth daily. 05/23/12   Himabindu Ravi, MD  meloxicam (MOBIC) 7.5 MG tablet Take 7.5 mg by mouth 2 (two) times daily. 04/11/12   Darrol Jump, MD    QUEtiapine (SEROQUEL) 400 MG tablet Take 2 tablets (800 mg total) by mouth at bedtime. 05/23/12   Elvin So, MD    Family History History reviewed. No pertinent family history.  Social History Social History  Substance Use Topics  . Smoking status: Never Smoker  . Smokeless tobacco: Not on file  . Alcohol use 0.6 oz/week    1 Cans of beer per week     Comment: PAIN PILLS--PERCOCET/VICODIN     Allergies   Patient has no known allergies.   Review of Systems Review of Systems  Unable to perform ROS: Psychiatric disorder  Psychiatric/Behavioral: Positive for depression.     Physical Exam Updated Vital Signs BP (!) 155/122 (BP Location: Left Arm)   Pulse 97   Temp 98.4 F (36.9 C) (Oral)   Resp 22   Ht 5\' 8"  (1.727 m)   Wt 180 lb 6 oz (81.8 kg)   SpO2 98%   BMI 27.43 kg/m   Physical Exam  Constitutional: He is oriented to person, place, and time. He appears well-developed and well-nourished. No distress.  HENT:  Head: Normocephalic and atraumatic.  Mouth/Throat: Oropharynx is clear and moist.  Eyes: Conjunctivae and EOM are normal. Pupils are equal, round, and reactive to light.  Neck: Normal range of motion. Neck supple.  Cardiovascular: Normal rate, regular rhythm and intact distal pulses.   No murmur heard. Pulmonary/Chest:  Effort normal and breath sounds normal. No respiratory distress. He has no wheezes. He has no rales.  Abdominal: Soft. He exhibits no distension. There is no tenderness. There is no rebound and no guarding.  Musculoskeletal: Normal range of motion. He exhibits no edema or tenderness.  Neurological: He is alert and oriented to person, place, and time.  Skin: Skin is warm and dry. No rash noted. No erythema.  Psychiatric: His affect is blunt. He is not actively hallucinating. He exhibits a depressed mood. He expresses no suicidal plans.  Passive suicidal ideation.  Pt not forthcoming with information  Nursing note and vitals  reviewed.    ED Treatments / Results  Labs (all labs ordered are listed, but only abnormal results are displayed) Labs Reviewed  I-STAT CHEM 8, ED - Abnormal; Notable for the following:       Result Value   Glucose, Bld 119 (*)    Calcium, Ion 1.04 (*)    All other components within normal limits  ETHANOL  RAPID URINE DRUG SCREEN, HOSP PERFORMED  CBC WITH DIFFERENTIAL/PLATELET    EKG  EKG Interpretation  Date/Time:  Saturday August 14 2016 14:12:50 EST Ventricular Rate:  81 PR Interval:  172 QRS Duration: 86 QT Interval:  376 QTC Calculation: 436 R Axis:   -71 Text Interpretation:  Normal sinus rhythm Left axis deviation Possible Anterior infarct , age undetermined No significant change since last tracing Confirmed by [facility]  MD, Loree Fee ([id number]) on 08/14/2016 1:23:01 PM       Radiology No results found.  Procedures Procedures (including critical care time)  Medications Ordered in ED Medications - No data to display   Initial Impression / Assessment and Plan / ED Course  I have reviewed the triage vital signs and the nursing notes.  Pertinent labs & imaging results that were available during my care of the patient were reviewed by me and considered in my medical decision making (see chart for details).    Most of patient's history comes from his brother. Patient apparently has had a mental breakdown in the last 2 weeks related to multiple stressors. He has been drinking alcohol heavily however brother states he's locked himself in the room is not eating or drinking. He has passively said he wanted his brother to shoot him but states he doesn't have the energy to kill himself. This is happened one other time 5 years ago where he was hospitalized psychiatrically was placed on an antidepressant and did well for 4 years until now. Patient states he has not drank any alcohol today. He is cooperative but does not give much information. Have show that patient is medically  clear.  4:24 PM TTS saw the pt and feel he needs to stay for treatment.  Pt was agreeable to stay overnight.  If pt tries to leave need to discuss with TTS about possible IVC.  Currently pt is cooperative.  Final Clinical Impressions(s) / ED Diagnoses   Final diagnoses:  Depression, unspecified depression type  Anxiety    New Prescriptions New Prescriptions   No medications on file     Blanchie Dessert, MD 08/14/16 1624

## 2016-08-14 NOTE — ED Notes (Signed)
Pt admitted to room #39. Pt reports "my roommate, brother, and fiance are concerned about me because I'm not leaving my room." Sad affect, anxious. Pt denies SI/HI. Pt reports emotional distress d/t financial problems. Encouragement and support provided. Special checks q 15 mins in place for safety. Video monitoring in place. Will continue to monitor.

## 2016-08-14 NOTE — BH Assessment (Signed)
[facility] Assessment Progress Note .Case was staffed with Meryl Crutch NP who recommended patient be re-evaluated in the a.m.

## 2016-08-14 NOTE — ED Notes (Signed)
Bed: [facility] Expected date:  Expected time:  Means of arrival:  Comments: 

## 2016-08-14 NOTE — ED Notes (Signed)
SBAR Report received from previous nurse. Pt received calm and visible on unit in bed. Pt denies current  HI, A/V H, or pain at this time, endorses passive SI, depression and rates anxiety 10/10  and appears otherwise stable and free of distress. Pt asked if he would be getting more ativan, it was reported to pt that he did not have that ordered. Pt reminded of camera surveillance, q 15 min rounds, and rules of the milieu. Will continue to assess.

## 2016-08-14 NOTE — ED Triage Notes (Signed)
Per family member, the patient has been "having a breakdown" over the last several weeks. Family member states that this happened 5-6 years ago when the patient was undergoing major financial problems. Family member states that patient does not bathe or take care of himself and he just lays in his room. He also states that the patient talks about how he is a monster, has hurt too many people, and wants to disappear.  Pt denies SI or HI to family member & states that he doesn't have the motivation.  When the nurse inquired, the patient was not responding and just closing his eyes and shaking his head.

## 2016-08-15 ENCOUNTER — Inpatient Hospital Stay (HOSPITAL_COMMUNITY)
Admission: AD | Admit: 2016-08-15 | Discharge: 2016-09-03 | DRG: 885 | Disposition: A | Discharge status: Home / Self Care | Payer: Federal, State, Local not specified - Other | Source: Intra-hospital | Attending: Psychiatry | Admitting: Psychiatry

## 2016-08-15 ENCOUNTER — Encounter (HOSPITAL_COMMUNITY): Payer: Self-pay

## 2016-08-15 DIAGNOSIS — Y900 Blood alcohol level of less than 20 mg/100 ml: Secondary | ICD-10-CM | POA: Diagnosis present

## 2016-08-15 DIAGNOSIS — F411 Generalized anxiety disorder: Secondary | ICD-10-CM | POA: Diagnosis not present

## 2016-08-15 DIAGNOSIS — Z79899 Other long term (current) drug therapy: Secondary | ICD-10-CM | POA: Diagnosis not present

## 2016-08-15 DIAGNOSIS — F1010 Alcohol abuse, uncomplicated: Secondary | ICD-10-CM | POA: Diagnosis present

## 2016-08-15 DIAGNOSIS — F1099 Alcohol use, unspecified with unspecified alcohol-induced disorder: Secondary | ICD-10-CM | POA: Diagnosis not present

## 2016-08-15 DIAGNOSIS — R45851 Suicidal ideations: Secondary | ICD-10-CM | POA: Diagnosis present

## 2016-08-15 DIAGNOSIS — Z87898 Personal history of other specified conditions: Secondary | ICD-10-CM

## 2016-08-15 DIAGNOSIS — Z590 Homelessness: Secondary | ICD-10-CM

## 2016-08-15 DIAGNOSIS — F332 Major depressive disorder, recurrent severe without psychotic features: Secondary | ICD-10-CM | POA: Diagnosis present

## 2016-08-15 DIAGNOSIS — F1490 Cocaine use, unspecified, uncomplicated: Secondary | ICD-10-CM | POA: Diagnosis present

## 2016-08-15 DIAGNOSIS — G4700 Insomnia, unspecified: Secondary | ICD-10-CM | POA: Diagnosis present

## 2016-08-15 DIAGNOSIS — F419 Anxiety disorder, unspecified: Secondary | ICD-10-CM | POA: Diagnosis not present

## 2016-08-15 DIAGNOSIS — Z560 Unemployment, unspecified: Secondary | ICD-10-CM

## 2016-08-15 DIAGNOSIS — Z8659 Personal history of other mental and behavioral disorders: Secondary | ICD-10-CM | POA: Diagnosis not present

## 2016-08-15 MED ORDER — SERTRALINE HCL 50 MG PO TABS
50.0000 mg | ORAL_TABLET | Freq: Every day | ORAL | Status: DC
  Administered 2016-08-16 – 2016-08-18 (×2): 50 mg via ORAL
  Filled 2016-08-15 (×5): qty 1

## 2016-08-15 MED ORDER — TRAZODONE HCL 50 MG PO TABS
50.0000 mg | ORAL_TABLET | Freq: Every evening | ORAL | Status: DC | PRN
  Administered 2016-08-15 – 2016-09-02 (×16): 50 mg via ORAL
  Filled 2016-08-15 (×3): qty 1
  Filled 2016-08-15: qty 7
  Filled 2016-08-15 (×14): qty 1

## 2016-08-15 MED ORDER — CLONIDINE HCL 0.1 MG PO TABS
0.1000 mg | ORAL_TABLET | Freq: Once | ORAL | Status: AC
  Administered 2016-08-15: 0.1 mg via ORAL

## 2016-08-15 MED ORDER — CLONIDINE HCL 0.1 MG PO TABS
ORAL_TABLET | ORAL | Status: AC
  Filled 2016-08-15: qty 1

## 2016-08-15 MED ORDER — BUSPIRONE HCL 10 MG PO TABS
10.0000 mg | ORAL_TABLET | Freq: Two times a day (BID) | ORAL | Status: DC
  Administered 2016-08-15: 10 mg via ORAL
  Filled 2016-08-15: qty 1

## 2016-08-15 MED ORDER — BUSPIRONE HCL 10 MG PO TABS
10.0000 mg | ORAL_TABLET | Freq: Two times a day (BID) | ORAL | Status: DC
  Administered 2016-08-15 – 2016-08-16 (×2): 10 mg via ORAL
  Filled 2016-08-15: qty 2
  Filled 2016-08-15 (×4): qty 1

## 2016-08-15 MED ORDER — HYDROXYZINE HCL 25 MG PO TABS
25.0000 mg | ORAL_TABLET | Freq: Three times a day (TID) | ORAL | Status: DC | PRN
  Administered 2016-08-15 – 2016-08-16 (×2): 25 mg via ORAL
  Filled 2016-08-15 (×2): qty 1

## 2016-08-15 MED ORDER — ACETAMINOPHEN 325 MG PO TABS: 650.0000 mg | ORAL_TABLET | Freq: Four times a day (QID) | ORAL | Status: DC | PRN

## 2016-08-15 NOTE — Tx Team (Signed)
Initial Treatment Plan 08/15/2016 6:10 PM BOBY PODGORNY [id number]    PATIENT STRESSORS: Financial difficulties Marital or family conflict Other: Unspecified   PATIENT STRENGTHS: Average or above average intelligence Communication skills Supportive family/friends   PATIENT IDENTIFIED PROBLEMS: Hopelessness"I can't think of anything to work on."  Depression                   DISCHARGE CRITERIA:  Improved stabilization in mood, thinking, and/or behavior  PRELIMINARY DISCHARGE PLAN: Outpatient therapy  PATIENT/FAMILY INVOLVEMENT: This treatment plan has been presented to and reviewed with the patient, DESIDERIO MOSKWA.  The patient and family have been given the opportunity to ask questions and make suggestions.  Marya Landry, RN 08/15/2016, 6:10 PM

## 2016-08-15 NOTE — Progress Notes (Signed)
Psychoeducational Group Note  Date:  08/15/2016 Time:  2355  Group Topic/Focus:  Wrap-Up Group:   The focus of this group is to help patients review their daily goal of treatment and discuss progress on daily workbooks.  Participation Level: Did Not Attend  Participation Quality:  Not Applicable  Affect:  Not Applicable  Cognitive:  Not Applicable  Insight:  Not Applicable  Engagement in Group: Not Applicable  Additional Comments:  The patient did not feel well enough to attend this evening's A.A.meeting.   Archie Balboa S 08/15/2016, 11:55 PM

## 2016-08-15 NOTE — Progress Notes (Signed)
Admission note:  Pt was admitted and oriented to 300 hall shortly before dinner. Although he signed in voluntarily, he was very reluctant to participate in admission procedures. He kept his eyes downcast and required some coaxing to participate in skin search and answer questions. "My life has come crashing down in the last two weeks," he said. "I haven't taken care of anything ... My life has come to a dead stop end ... I see no way out." Pt says he has been here before, and returning is his "worse nightmare." Pt says that his life was going well until two weeks ago, when things come crashing down. Pt is vague, anxious, and declines to give further details. Asked of his stressors, he responds, "too many to count." Pt says that he had been isolating himself in his room and that yesterday loved ones had staged an intervention of sorts, telling him that unless he came in for help voluntarily, they would have him brought in by IVC. He admits some SI but contracts for safety. He denies any alcohol problem and says that typically when he drinks, it's $15-$20 bottles at dinner with his fiance. He also denied any illicit substances. The TTS note quotes him as saying, however, that "I am just drinking myself to death. He also reported to a previous clinician that he had been smoking crack for the last week. His UDS, however, was negative. Pt's skin search was unremarkable, except for a few small well-healed, old scars. No contraband found. Pt denied HI, AVH, and pain. Pt declined flu/pneumonia vaccines. He denied using nicotine. Safety and unit procedures were reviewed with pt. Pt was urged to approach staff with concerns/needs. He contracted verbally for safety. 15 minute checks in place. Will continue to monitor for needs/safety.

## 2016-08-15 NOTE — Progress Notes (Signed)
D.  Pt forwarded little on approach, complaint of chronic insomnia.  Pt states "Oh I won't be sleeping tonight".  Pt did go to evening AA group, observed with appropriate but minimal interaction on unit.  Pt denies SI/HI/hallucinations at this time.  A.  Support and encouragement offered, medication given as ordered  R.  Pt remains safe on the unit, will continue to monitor.

## 2016-08-15 NOTE — Progress Notes (Signed)
CSW spoke with patient at bedside regarding voluntary admission and consent for treatment. CSW obtained patient's signature on voluntary admission and consent for treatment form. CSW inquired if patient had any questions, patient replied no. CSW faxed signed form to [facility]. CSW provided patient's RN with signed form.

## 2016-08-15 NOTE — Consult Note (Signed)
[place] [facility] Consult   Reason for Consult:  Psychiatric Consultation Referring Physician:  EDP Patient Identification: Frank [name] MRN:  [id number] Principal Diagnosis: MDD (major depressive disorder) Diagnosis:   Patient Active Problem List   Diagnosis Date Noted  . MDD (major depressive disorder) [F32.9] 05/20/2012  . Cocaine abuse [F14.10] 05/20/2012    Class: Chronic  . Chronic pain associated with significant psychosocial dysfunction [G89.4] 04/01/2012  . Anxiety [F41.9]   . Depression [F32.9]   . Drug addiction ([place]) [F19.20]     Total Time spent with patient: 30 minutes  Subjective:   Frank [name] is a 66 y.o. male patient who states "my friends and family brought me here because they were concerned."  HPI:  Frank [name] is an 66 y.o. male that presents this date voluntary brought in by brother (patient declines to give family name) due to passive thoughts of self harm without a plan. Patient is very anxious and renders limited history stating 'the last time I was honest with a counselor I was put in the hospital for three months." Patient states he has been residing with his brother and has recently just realized "how bad his life was." Patient reports ongoing depression with symptoms to include: hopelessness, isolating (pt states he has not been out of his room in one week) and excessive guilt. Patient states since he has lost his employment within the last month, he has relocated to reside with his brother. Patient stated his brother convinced him to seek help this date after patient has been isolating and "drinking every day" or he would be asked to leave. Patient reports daily alcohol use for the last two months stating he consumes "3 or 4 drinks (liquor) every day." Patient denies any current withdrawals. Patient states he has used multiple substances in the past but denies any current use stating "I am just drinking myself to death." Patient admits to one previous  attempt at self harm but would not elaborate on time period or details of that incident. Patient is very vague in reference to treatment history but reports multiple admission for MH/SA issues in the past. Patient denies being on any MH medications for the last five years. Patient states he was admitted to [facility] in 2013 for depression and SA issues. Patient also reported he was in [facility] for three months prior to 2013. Patient will not elaborate on that admission. Patient denies any H/I or AVH this date. Patient is oriented to time/place. Patient is vague in reference to current S/I stating "I said enough I am over all of it." Admission note stated: "Per family member, the patient has been "having a breakdown" over the last several weeks. Family member states that this happened 5-6 years ago when the patient was undergoing major financial problems. Family member states that patient does not bathe or take care of himself and he just lays in his room. He also states that the patient talks about how he is a monster, has hurt too many people, and wants to disappear. Pt denies SI or HI to family member &states that he doesn't have the motivation. When the nurse inquired, the patient was not responding and just closing his eyes and shaking his head".  [facility] evaluation: Chart and nursing notes reviewed. Face-to-face to evaluation completed with Dr. Louretta Shorten. Patient is alert and oriented 4; calm and cooperative. Patient states that he isn't isolating himself in his room for 14 days and has not been eating. States that  he has only been using alcohol and staying away from family and friends. He identifies stressors as his relationship with his fiance; losing his house and currently is renting a room from a friend and financial difficulties. He states up until 2 weeks ago he was doing fine but states that stress and anxiety became overwhelming. He was working as an over Geophysicist/field seismologist but has not been able to work for the past 2  weeks.He reports a past history of substance abuse but has been clean for over the past 5 years. He states drinking wine and 2 bottles of rum over the past 2 weeks. E denies symptoms related to alcohol withdrawal. His affect is flat. He appears depressed. He has minimal eye contact. He reports a past history of depression and has not been able to follow up with any outpatient provider due to lack of insurance. He denies homicidal ideation, intent or plan. He denies AVH. He endorses passive suicidal ideation. He states he does not feel safe returning home and is unable to contract for safety.  Past Psychiatric History: Depression, Anxiety  Risk to Self: Suicidal Ideation: Yes-Currently Present (passive S/I) Suicidal Intent: No Is patient at risk for suicide?: No Suicidal Plan?: No Access to Means: No What has been your use of drugs/alcohol within the last 12 months?: Current use How many times?: 1 Other Self Harm Risks: na Triggers for Past Attempts: Unknown Intentional Self Injurious Behavior: None Risk to Others: Homicidal Ideation: No Thoughts of Harm to Others: No Current Homicidal Intent: No Current Homicidal Plan: No Access to Homicidal Means: No Identified Victim: na History of harm to others?: No Assessment of Violence: None Noted Violent Behavior Description: na Does patient have access to weapons?: No Criminal Charges Pending?: No Does patient have a court date: No Prior Inpatient Therapy: Prior Inpatient Therapy: Yes Prior Therapy Dates: 2013 Prior Therapy Facilty/Provider(s): [facility] Reason for Treatment: MH issues Prior Outpatient Therapy: Prior Outpatient Therapy: Yes Prior Therapy Dates: 2012 Prior Therapy Facilty/Provider(s): [facility] Reason for Treatment: MH issues Does patient have an ACCT team?: No Does patient have Intensive In-House Services?  : No Does patient have [facility] services? : No Does patient have [facility] services?: No  Past Medical History:  Past Medical  History:  Diagnosis Date  . Anxiety   . Depression   . Drug addiction ([place])    History reviewed. No pertinent surgical history. Family History: History reviewed. No pertinent family history. Family Psychiatric  History: unknown Social History:  History  Alcohol Use  . 0.6 oz/week  . 1 Cans of beer per week    Comment: PAIN PILLS--PERCOCET/VICODIN     History  Drug Use  . Types: Other-see comments, "Crack" cocaine, Cocaine    Comment: smoking crack for the past week    Social History   Social History  . Marital status: Divorced    Spouse name: N/A  . Number of children: N/A  . Years of education: N/A   Social History Main Topics  . Smoking status: Never Smoker  . Smokeless tobacco: None  . Alcohol use 0.6 oz/week    1 Cans of beer per week     Comment: PAIN PILLS--PERCOCET/VICODIN  . Drug use: Yes    Types: Other-see comments, "Crack" cocaine, Cocaine     Comment: smoking crack for the past week  . Sexual activity: Not Asked     Comment: oxycontin   Other Topics Concern  . None   Social History Narrative  . None  Additional Social History:    Allergies:  No Known Allergies  Labs:  Results for orders placed or performed during the hospital encounter of 08/14/16 (from the past 48 hour(s))  Rapid urine drug screen (hospital performed)     Status: None   Collection Time: 08/14/16  1:25 PM  Result Value Ref Range   Opiates NONE DETECTED NONE DETECTED   Cocaine NONE DETECTED NONE DETECTED   Benzodiazepines NONE DETECTED NONE DETECTED   Amphetamines NONE DETECTED NONE DETECTED   Tetrahydrocannabinol NONE DETECTED NONE DETECTED   Barbiturates NONE DETECTED NONE DETECTED    Comment:        DRUG SCREEN FOR MEDICAL PURPOSES ONLY.  IF CONFIRMATION IS NEEDED FOR ANY PURPOSE, NOTIFY LAB WITHIN 5 DAYS.        LOWEST DETECTABLE LIMITS FOR URINE DRUG SCREEN Drug Class       Cutoff (ng/mL) Amphetamine      1000 Barbiturate      200 Benzodiazepine    [id number] Tricyclics       [id number] Opiates          300 Cocaine          300 THC              50   CBC with Differential/Platelet     Status: None   Collection Time: 08/14/16  1:32 PM  Result Value Ref Range   WBC 8.1 4.0 - 10.5 K/uL   RBC 5.67 4.22 - 5.81 MIL/uL   Hemoglobin 16.7 13.0 - 17.0 g/dL   HCT 46.5 39.0 - 52.0 %   MCV 82.0 78.0 - 100.0 fL   MCH 29.5 26.0 - 34.0 pg   MCHC 35.9 30.0 - 36.0 g/dL   RDW 12.9 11.5 - 15.5 %   Platelets 213 150 - 400 K/uL   Neutrophils Relative % 74 %   Neutro Abs 6.0 1.7 - 7.7 K/uL   Lymphocytes Relative 16 %   Lymphs Abs 1.3 0.7 - 4.0 K/uL   Monocytes Relative 9 %   Monocytes Absolute 0.8 0.1 - 1.0 K/uL   Eosinophils Relative 0 %   Eosinophils Absolute 0.0 0.0 - 0.7 K/uL   Basophils Relative 1 %   Basophils Absolute 0.1 0.0 - 0.1 K/uL  Ethanol     Status: None   Collection Time: 08/14/16  1:37 PM  Result Value Ref Range   Alcohol, Ethyl (B) <5 <5 mg/dL    Comment:        LOWEST DETECTABLE LIMIT FOR SERUM ALCOHOL IS 5 mg/dL FOR MEDICAL PURPOSES ONLY   I-stat chem 8, ed     Status: Abnormal   Collection Time: 08/14/16  1:39 PM  Result Value Ref Range   Sodium 139 135 - 145 mmol/L   Potassium 3.7 3.5 - 5.1 mmol/L   Chloride 103 101 - 111 mmol/L   BUN 15 6 - 20 mg/dL   Creatinine, Ser 0.80 0.61 - 1.24 mg/dL   Glucose, Bld 119 (H) 65 - 99 mg/dL   Calcium, Ion 1.04 (L) 1.15 - 1.40 mmol/L   TCO2 22 0 - 100 mmol/L   Hemoglobin 17.0 13.0 - 17.0 g/dL   HCT 50.0 39.0 - 52.0 %    Current Facility-Administered Medications  Medication Dose Route Frequency Provider Last Rate Last Dose  . busPIRone (BUSPAR) tablet 10 mg  10 mg Oral BID Lurena Nida, NP      . hydrOXYzine (ATARAX/VISTARIL) tablet 25 mg  25 mg Oral TID  PRN Lurena Nida, NP   25 mg at 08/15/16 0920  . sertraline (ZOLOFT) tablet 50 mg  50 mg Oral Daily Lurena Nida, NP   50 mg at 08/15/16 0920  . traZODone (DESYREL) tablet 50 mg  50 mg Oral QHS PRN Lurena Nida, NP   50 mg at  08/14/16 2123   Current Outpatient Prescriptions  Medication Sig Dispense Refill  . ibuprofen (ADVIL,MOTRIN) 200 MG tablet Take 600 mg by mouth every 6 (six) hours as needed for moderate pain.    Marland Kitchen FLUoxetine (PROZAC) 20 MG capsule Take 1 capsule (20 mg total) by mouth daily. (Patient not taking: Reported on 08/14/2016) 30 capsule 0  . QUEtiapine (SEROQUEL) 400 MG tablet Take 2 tablets (800 mg total) by mouth at bedtime. (Patient not taking: Reported on 08/14/2016) 60 tablet 0    Musculoskeletal: Strength & Muscle Tone: unable to assess; patient in bed during evaluation Gait & Station: unable to assess; patient in bed during evaluation Patient leans: unable to assess; patient in bed during evaluation  Psychiatric Specialty Exam: Physical Exam  Nursing note and vitals reviewed.   Review of Systems  Psychiatric/Behavioral: Positive for depression and suicidal ideas. The patient is nervous/anxious and has insomnia.     Blood pressure 146/96, pulse 89, temperature 98.9 F (37.2 C), temperature source Oral, resp. rate 16, height 5\' 8"  (1.727 m), weight 81.8 kg (180 lb 6 oz), SpO2 96 %.Body mass index is 27.43 kg/m.  General Appearance: Disheveled  Eye Contact:  Poor  Speech:  Blocked, Clear and Coherent and Slow  Volume:  Decreased  Mood:  Depressed  Affect:  Depressed and Flat  Thought Process:  Coherent  Orientation:  Full (Time, Place, and Person)  Thought Content:  Logical  Suicidal Thoughts:  Yes.  without intent/plan  Homicidal Thoughts:  No  Memory:  Immediate;   Fair Recent;   Fair  Judgement:  Fair  Insight:  Fair  Psychomotor Activity:  Decreased  Concentration:  Concentration: Fair and Attention Span: Fair  Recall:  [employer] of Knowledge:  Fair  Language:  Fair  Akathisia:  No  Handed:  Right  AIMS (if indicated):     Assets:  Communication Skills Desire for Improvement Housing Physical Health Resilience  ADL's:  Intact  Cognition:  WNL  Sleep:      Case  discussed with Dr. Louretta Shorten; recommendations are: Treatment Plan Summary: Daily contact with patient to assess and evaluate symptoms and progress in treatment and Medication management  BuSpar 10 mg twice daily. Vistaril 25 mg 3 times a day as needed for anxiety. Zoloft 50 mg daily. Trazodone 50 mg at bedtime as needed for sleep   Disposition: Recommend psychiatric Inpatient admission when medically cleared.  Serena Colonel, PMHNP-BC, FNP-BC [place] 08/15/2016 2:05 PM   Patient seen face to face for this evaluation along with physician extender, case discussed with treatment team and formulated treatment plan. Reviewed the information documented and agree with the treatment plan.   [facility] 08/15/2016 6:16 PM

## 2016-08-15 NOTE — ED Notes (Signed)
Pt refused AM vitals.

## 2016-08-15 NOTE — ED Notes (Signed)
[facility] called for [facility] transport.  Shift change in progress.

## 2016-08-16 DIAGNOSIS — F332 Major depressive disorder, recurrent severe without psychotic features: Principal | ICD-10-CM

## 2016-08-16 DIAGNOSIS — F1099 Alcohol use, unspecified with unspecified alcohol-induced disorder: Secondary | ICD-10-CM

## 2016-08-16 DIAGNOSIS — Z79899 Other long term (current) drug therapy: Secondary | ICD-10-CM

## 2016-08-16 DIAGNOSIS — F411 Generalized anxiety disorder: Secondary | ICD-10-CM

## 2016-08-16 DIAGNOSIS — R45851 Suicidal ideations: Secondary | ICD-10-CM

## 2016-08-16 LAB — GLUCOSE, CAPILLARY: GLUCOSE-CAPILLARY: 86 mg/dL (ref 65–99)

## 2016-08-16 MED ORDER — BUSPIRONE HCL 10 MG PO TABS
10.0000 mg | ORAL_TABLET | Freq: Three times a day (TID) | ORAL | Status: DC
  Administered 2016-08-16: 10 mg via ORAL
  Filled 2016-08-16 (×2): qty 1
  Filled 2016-08-16: qty 2
  Filled 2016-08-16 (×3): qty 1

## 2016-08-16 MED ORDER — GABAPENTIN 100 MG PO CAPS
200.0000 mg | ORAL_CAPSULE | Freq: Three times a day (TID) | ORAL | Status: DC
  Administered 2016-08-16: 200 mg via ORAL
  Filled 2016-08-16 (×6): qty 2

## 2016-08-16 NOTE — Progress Notes (Signed)
Nursing Note 08/16/2016 [id number]  Data Reports sleeping fair.  Declined self inventory today.  Affect anxious mood "anxious and depressed."  Denies HI, SI, AVH.  Patient isolated during day.  Needed a lot of prompting to get out of room.  Very vague and forwards very little.  Patient missed his 1330 gabapentin, did not come to desk after multiple prompts.  Was hesitant to take pm meds, verbalizing being "frightened" with the med changes. Withdrawn, spends most of free time in room.  Action Spoke with patient 1:1, nurse offered support to patient throughout shift. Offered PRN for anxiety (received one in AM.) Nurse explained to patient he had the right to refuse meds and did not have to take meds if he wasn't comfortable.  Med education given.  Continues to be monitored on 15 minute checks for safety.  Response Patient took meds saying "I guess I have to take them," and he was reminded that he did not have to, but could choose not to.  Patient remains safe but guarded and appears anxious throughout day.

## 2016-08-16 NOTE — BHH Counselor (Signed)
Adult Comprehensive Assessment  Patient ID: Frank [name], male   DOB: [birth year], 66 y.o.   MRN: [id number]  Information Source: Information source: Patient --pt minimal during assessment; forwarding little information; flat/depressed/vague/declined to answer several questions.   Current Stressors:  Educational / Learning stressors: NA Employment / Job issues: No job, lost job due to economy. Client states that this was the starting point of his problems. Family Relationships: Recent divorce. Not relationship with children. Financial / Lack of resources (include bankruptcy): No income. Housing / Lack of housing: Lost housing at [employer]. Physical health (include injuries & life threatening diseases): NA Social relationships: No supports. Substance abuse: Pain medication. Bereavement / Loss: Feels as if he has "lost everything"  Living/Environment/Situation:  Living Arrangements: living with his brother  Living conditions (as described by patient or guardian): fair  How long has patient lived in current situation?: few years What is atmosphere in current home: pt reports that he tends to isolate when depressed.   Family History:  Marital status: Divorced Divorced, when?: few years  What types of issues is patient dealing with in the relationship?: NA Additional relationship information: NA Does patient have children?: Yes How many children?: 2  How is patient's relationship with their children?: No relationship with his children.   Childhood History:  By whom was/is the patient raised?: Both parents Additional childhood history information: NA Description of patient's relationship with caregiver when they were a child: I don't know Patient's description of current relationship with people who raised him/her: NA Does patient have siblings?: Yes Number of Siblings: 2  Description of patient's current relationship with siblings: NA Did patient suffer any  verbal/emotional/physical/sexual abuse as a child?: Yes (Refused to divulge) Did patient suffer from severe childhood neglect?: Yes Patient description of severe childhood neglect: NA Has patient ever been sexually abused/assaulted/raped as an adolescent or adult?: No Was the patient ever a victim of a crime or a disaster?: No Witnessed domestic violence?: No Has patient been effected by domestic violence as an adult?: No  Education:  Highest grade of school patient has completed: Some graduate work after college, studied philosophy and [employer] Currently a Ship broker?: No Name of school: [employer] disability?: No  Employment/Work Situation:  Employment situation: Unemployed x 1 month.  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 UDS negative BAL negative.  If attempted suicide, did drugs/alcohol play a role in this?: No-pt does endorse SI with plan upon admission.  Alcohol/Substance Abuse Treatment Hx: Past Tx, Inpatient;Past Tx, Outpatient If yes, describe treatment: Both, [facility] Has alcohol/substance abuse ever caused legal problems?: Yes (Possession)  Social Support System: Patient's Community Support System: None Describe Community Support System: No supports. Been isolating Type of faith/religion: Lost religion How does patient's faith help to cope with current illness?:  NA  Leisure/Recreation:  Leisure and Hobbies: Not anymore, used to like to hike outdoors, go to [employer]:  What things does the patient do well?: Nothing anymore, teaching in the past In what areas does patient struggle / problems for patient: Struggles with everything right now, lost everything  Discharge Plan:  Does patient have access to transportation?: No Plan for no access to transportation at discharge: bus or family member/brother  Will patient be returning to same living situation after discharge?: possibly Plan for living situation after discharge: will need help planning, won't be admitted back probably, asked to leave due to drug use Currently receiving community mental health services: No-pt reports that he has not followed up with outpatient recommendations x 5 years.  If no, would patient like referral for services when discharged?: Yes (What county?) ([place])-[facility]. Thinking about other (inpatient options) but unable to fully participate in discussing aftercare at this time due to mood lability/depression/drowsiness.  Does patient have financial barriers related to discharge medications?: Yes-no income; no insurance. CSW assessing for appropriate referrals.   Summary/Recommendations:   Summary and Recommendations (to be completed by the evaluator): Patient is 66yo male living in [place], [place] ([place]). He presents to the hospital seeking treatment for: increased depression, Suicidal ideations, medication stabilization, mood lability, and for medication stabilization. Patient also reports recent increase in alcohol consumption and recent crack cocaine abuse (UDS and BAL negative upon admission). Patient's last admission to [facility] was [facility] 2013. patient reports he has not been on psychiatric medications in five years. Patient reports that life has been going fairly well until a few weeks ago but would not elaborate. Patient denies current SI/Hi/AVH.  Recommendations for patient include: crisis stabilization, therapeutic milieu, encourage group attendance and participation, medication management for mood stabilization, and development of comprehensive mental wellness/sobriety plan. CSW assessing for appropriate referrals at this time.   Kimber Relic Smart LCSW 08/16/2016 2:55 PM

## 2016-08-16 NOTE — [facility] Suicide Risk Assessment (Signed)
[facility] Admission Suicide Risk Assessment   Nursing information obtained from:    Demographic factors:    Current Mental Status:    Loss Factors:    Historical Factors:    Risk Reduction Factors:     Total Time spent with patient: 45 minutes Principal Problem: <principal problem not specified> Diagnosis:   Patient Active Problem List   Diagnosis Date Noted  . MDD (major depressive disorder), recurrent severe, without psychosis ([place]) [F33.2] 08/15/2016  . MDD (major depressive disorder) [F32.9] 05/20/2012  . Cocaine abuse [F14.10] 05/20/2012    Class: Chronic  . Chronic pain associated with significant psychosocial dysfunction [G89.4] 04/01/2012  . Anxiety [F41.9]   . Depression [F32.9]   . Drug addiction ([place]) [F19.20]    Subjective Data: patient is a 66 YO male with depression, anxiety, alcohol use disorder  Continued Clinical Symptoms: anxiety, depression, suicidal thoughts, alcohol withdrawal   The "Alcohol Use Disorders Identification Test", Guidelines for Use in Primary Care, Second Edition.  World Pharmacologist [facility]). Score between 0-7:  no or low risk or alcohol related problems. Score between 8-15:  moderate risk of alcohol related problems. Score between 16-19:  high risk of alcohol related problems. Score 20 or above:  warrants further diagnostic evaluation for alcohol dependence and treatment.   CLINICAL FACTORS:   Severe Anxiety and/or Agitation Depression:   Anhedonia Hopelessness Impulsivity Insomnia Dysthymia Alcohol/Substance Abuse/Dependencies   Musculoskeletal: Strength & Muscle Tone: within normal limits Gait & Station: normal Patient leans: N/A  Psychiatric Specialty Exam: Physical Exam  ROS  Blood pressure 133/88, pulse 89, temperature 98.2 F (36.8 C), resp. rate 20, height 5' 7.5" (1.715 m), weight 81.6 kg (180 lb).Body mass index is 27.78 kg/m.  General Appearance: Disheveled  Eye Contact:  Poor  Speech:  Slow  Volume:  Decreased   Mood:  Anxious and Depressed  Affect:  Congruent  Thought Process:  Linear  Orientation:  Full (Time, Place, and Person)  Thought Content:  Negative  Suicidal Thoughts:  Yes.  without intent/plan  Homicidal Thoughts:  No  Memory:  Immediate;   Fair  Judgement:  Impaired  Insight:  Lacking  Psychomotor Activity:  Restlessness  Concentration:  Concentration: Fair  Recall:  Poor  Fund of Knowledge:  Fair  Language:  NA  Akathisia:  No  Handed:  Right  AIMS (if indicated):     Assets:  Desire for Improvement  ADL's:  Impaired  Cognition:  Impaired,  Mild  Sleep:  Number of Hours: 5.5      COGNITIVE FEATURES THAT CONTRIBUTE TO RISK:  Closed-mindedness    SUICIDE RISK:   Moderate:  Frequent suicidal ideation with limited intensity, and duration, some specificity in terms of plans, no associated intent, good self-control, limited dysphoria/symptomatology, some risk factors present, and identifiable protective factors, including available and accessible social support.  PLAN OF CARE: 1) continue zoloft 2) increase buspar, 3) add neurontin, 4) ETOH withdrawal protocol, 5) treat diabetes, 6) therapeutic [place]  I certify that inpatient services furnished can reasonably be expected to improve the patient's condition.   Sharma Covert, MD 08/16/2016, 1:42 PM

## 2016-08-16 NOTE — Tx Team (Signed)
Interdisciplinary Treatment and Diagnostic Plan Update  08/17/2016 Time of Session: [place] MRN: [id number]  Principal Diagnosis: MDD without psychotic features  Secondary Diagnoses: Active Problems:   MDD (major depressive disorder), recurrent severe, without psychosis ([place])   Current Medications:  Current Facility-Administered Medications  Medication Dose Route Frequency Provider Last Rate Last Dose  . acetaminophen (TYLENOL) tablet 650 mg  650 mg Oral Q6H PRN Lurena Nida, NP      . busPIRone (BUSPAR) tablet 15 mg  15 mg Oral TID Sharma Covert, MD      . gabapentin (NEURONTIN) capsule 300 mg  300 mg Oral TID Sharma Covert, MD      . hydrOXYzine (ATARAX/VISTARIL) tablet 25 mg  25 mg Oral TID PRN Lurena Nida, NP   25 mg at 08/16/16 0904  . QUEtiapine (SEROQUEL) tablet 100 mg  100 mg Oral Daily Sharma Covert, MD      . QUEtiapine (SEROQUEL) tablet 200 mg  200 mg Oral QHS Sharma Covert, MD      . sertraline (ZOLOFT) tablet 50 mg  50 mg Oral Daily Lurena Nida, NP   50 mg at 08/16/16 0903  . traZODone (DESYREL) tablet 50 mg  50 mg Oral QHS PRN Lurena Nida, NP   50 mg at 08/16/16 2200   PTA Medications: Prescriptions Prior to Admission  Medication Sig Dispense Refill Last Dose  . ibuprofen (ADVIL,MOTRIN) 200 MG tablet Take 600 mg by mouth every 6 (six) hours as needed for moderate pain.   Past Week at Unknown time  . FLUoxetine (PROZAC) 20 MG capsule Take 1 capsule (20 mg total) by mouth daily. (Patient not taking: Reported on 08/14/2016) 30 capsule 0 Not Taking at Unknown time  . QUEtiapine (SEROQUEL) 400 MG tablet Take 2 tablets (800 mg total) by mouth at bedtime. (Patient not taking: Reported on 08/14/2016) 60 tablet 0 Not Taking at Unknown time    Patient Stressors: Financial difficulties Marital or family conflict Other: Unspecified  Patient Strengths: Average or above average intelligence Communication skills Supportive family/friends  Treatment  Modalities: Medication Management, Group therapy, Case management,  1 to 1 session with clinician, Psychoeducation, Recreational therapy.   Physician Treatment Plan for Primary Diagnosis:MDD without psychotic features Long Term Goal(s): Improvement in symptoms so as ready for discharge Improvement in symptoms so as ready for discharge   Short Term Goals: Ability to identify changes in lifestyle to reduce recurrence of condition will improve Ability to verbalize feelings will improve Ability to disclose and discuss suicidal ideas Ability to demonstrate self-control will improve Ability to identify and develop effective coping behaviors will improve Ability to maintain clinical measurements within normal limits will improve Compliance with prescribed medications will improve Ability to identify triggers associated with substance abuse/mental health issues will improve Ability to identify changes in lifestyle to reduce recurrence of condition will improve Ability to verbalize feelings will improve Ability to disclose and discuss suicidal ideas Ability to demonstrate self-control will improve Ability to identify and develop effective coping behaviors will improve Ability to maintain clinical measurements within normal limits will improve Compliance with prescribed medications will improve Ability to identify triggers associated with substance abuse/mental health issues will improve  Medication Management: Evaluate patient's response, side effects, and tolerance of medication regimen.  Therapeutic Interventions: 1 to 1 sessions, Unit Group sessions and Medication administration.  Evaluation of Outcomes: Progressing  Physician Treatment Plan for Secondary Diagnosis: Active Problems:   MDD (major depressive disorder),  recurrent severe, without psychosis ([place])  Long Term Goal(s): Improvement in symptoms so as ready for discharge Improvement in symptoms so as ready for discharge   Short  Term Goals: Ability to identify changes in lifestyle to reduce recurrence of condition will improve Ability to verbalize feelings will improve Ability to disclose and discuss suicidal ideas Ability to demonstrate self-control will improve Ability to identify and develop effective coping behaviors will improve Ability to maintain clinical measurements within normal limits will improve Compliance with prescribed medications will improve Ability to identify triggers associated with substance abuse/mental health issues will improve Ability to identify changes in lifestyle to reduce recurrence of condition will improve Ability to verbalize feelings will improve Ability to disclose and discuss suicidal ideas Ability to demonstrate self-control will improve Ability to identify and develop effective coping behaviors will improve Ability to maintain clinical measurements within normal limits will improve Compliance with prescribed medications will improve Ability to identify triggers associated with substance abuse/mental health issues will improve     Medication Management: Evaluate patient's response, side effects, and tolerance of medication regimen.  Therapeutic Interventions: 1 to 1 sessions, Unit Group sessions and Medication administration.  Evaluation of Outcomes: Progressing   RN Treatment Plan for Primary Diagnosis: MDD without psychotic features Long Term Goal(s): Knowledge of disease and therapeutic regimen to maintain health will improve  Short Term Goals: Ability to remain free from injury will improve, Ability to identify and develop effective coping behaviors will improve and Compliance with prescribed medications will improve  Medication Management: RN will administer medications as ordered by provider, will assess and evaluate patient's response and provide education to patient for prescribed medication. RN will report any adverse and/or side effects to prescribing  provider.  Therapeutic Interventions: 1 on 1 counseling sessions, Psychoeducation, Medication administration, Evaluate responses to treatment, Monitor vital signs and CBGs as ordered, Perform/monitor CIWA, COWS, AIMS and Fall Risk screenings as ordered, Perform wound care treatments as ordered.  Evaluation of Outcomes: Progressing   LCSW Treatment Plan for Primary Diagnosis: MDD without psychotic features Long Term Goal(s): Safe transition to appropriate next level of care at discharge, Engage patient in therapeutic group addressing interpersonal concerns.  Short Term Goals: Engage patient in aftercare planning with referrals and resources, Facilitate patient progression through stages of change regarding substance use diagnoses and concerns and Identify triggers associated with mental health/substance abuse issues  Therapeutic Interventions: Assess for all discharge needs, 1 to 1 time with Social worker, Explore available resources and support systems, Assess for adequacy in community support network, Educate family and significant other(s) on suicide prevention, Complete Psychosocial Assessment, Interpersonal group therapy.  Evaluation of Outcomes: Progressing   Progress in Treatment: Attending groups: No. New to unit. Continuing to assess.  Participating in groups: No. Taking medication as prescribed: Yes. Toleration medication: Yes. Family/Significant other contact made: No, will contact:  family member if patient consents Patient understands diagnosis: Yes. Discussing patient identified problems/goals with staff: Yes. Medical problems stabilized or resolved: Yes. Denies suicidal/homicidal ideation: Yes. Issues/concerns per patient self-inventory: No. Other: n/a   New problem(s) identified: No, Describe:  n/a  New Short Term/Long Term Goal(s): detox; medication stabilization; development of comprehensive mental wellness/sobriety plan.   Discharge Plan or Barriers: CSW assessing  for appropriate referrals. He was last admitted to [facility] in 2013 and referred to [facility]. Pt reports that he has not followed up with outpatient provider or received psychiatric medications in the past 5 years.   Reason for Continuation of  Hospitalization: Depression Medication stabilization Withdrawal symptoms  Estimated Length of Stay: 3-5 days   Attendees: Patient: 08/17/2016 2:51 PM  Physician: Dr. Mallie Darting MD 08/17/2016 2:51 PM  Nursing: Gari Crown RN; Dan RN 08/17/2016 2:51 PM  RN Care Manager: Lars Pinks CM 08/17/2016 2:51 PM  Social Worker: Maxie Better, LCSW; Matthew Saras, LCSWA; Adriana Reams LCSW 08/17/2016 2:51 PM  Recreational Therapist: Rhunette Croft 08/17/2016 2:51 PM  Other: Lindell Spar NP 08/17/2016 2:51 PM  Other:  08/17/2016 2:51 PM  Other: 08/17/2016 2:51 PM    Scribe for Treatment Team: [place], LCSW 08/17/2016 2:51 PM

## 2016-08-16 NOTE — Progress Notes (Signed)
Recreation Therapy Notes  Date: 08/16/16 Time: 0930 Location: [address] Dayroom  Group Topic: Stress Management  Goal Area(s) Addresses:  Patient will verbalize importance of using healthy stress management.  Patient will identify positive emotions associated with healthy stress management.   Intervention: Stress Management  Activity :  Guided Imagery.  LRT introduced to thebstress management technique guided imagery.  LRT read a script that allowed patients to take a "mental vacation" from their surroundings.  Patients were to follow along as LRT read script to engage in the technique.   Education:  Stress Management, Discharge Planning.   Education Outcome: Acknowledges edcuation/In group clarification offered/Needs additional education  Clinical Observations/Feedback: Pt did not attend group.    Victorino Sparrow, LRT/CTRS         Ria Comment, Finian Helvey A 08/16/2016 12:12 PM

## 2016-08-16 NOTE — Progress Notes (Signed)
Patient ID: Frank [name], male   DOB: Nov 23, 1950, 66 y.o.   MRN: [id number] D: Client in bed this shift, reports depression and anxiety "8" of 10. A: Writer provided emotional support,. Encouraged client to report any concerns. Medications reviewed, administered as ordered. Staff will monitor q66min for safety. R: Client is safe on the unit, did not attend group.

## 2016-08-16 NOTE — H&P (Signed)
Psychiatric Admission Assessment Adult  Patient Identification: Frank [name] MRN:  [id number] Date of Evaluation:  08/16/2016 Chief Complaint:  MDD without psychotic features severe GAD ETOH abuse severe Principal Diagnosis: MDD, GAD, alcohol use disorder Diagnosis:   Patient Active Problem List   Diagnosis Date Noted  . MDD (major depressive disorder), recurrent severe, without psychosis ([place]) [F33.2] 08/15/2016  . MDD (major depressive disorder) [F32.9] 05/20/2012  . Cocaine abuse [F14.10] 05/20/2012    Class: Chronic  . Chronic pain associated with significant psychosocial dysfunction [G89.4] 04/01/2012  . Anxiety [F41.9]   . Depression [F32.9]   . Drug addiction ([place]) [F19.20]    History of Present Illness: Patient is seen and examined. Patient is a 66 YO male transferred from [facility] secondary to depression. The patient is essentially alexithymic and unable to provide a great deal of history. The patient presented this date voluntary brought in by brother (patient declines to give family name) due to passive thoughts of self harm without a plan. Patient is very anxious and renders limited history stating 'the last time I was honest with a counselor I was put in the hospital for three months." Patient states he has been residing with his brother and has recently just realized "how bad his life was." Patient reports ongoing depression with symptoms to include: hopelessness, isolating (pt states he has not been out of his room in one week) and excessive guilt. Patient states since he has lost his employment within the last month, he has relocated to reside with his brother. Patient stated his brother convinced him to seek help this date after patient has been isolating and "drinking every day" or he would be asked to leave. Patient reports daily alcohol use for the last two months stating he consumes "3 or 4 drinks (liquor) every day." Patient denies any current withdrawals.  Patient states he has used multiple substances in the past but denies any current use stating "I am just drinking myself to death." Patient admits to one previous attempt at self harm but would not elaborate on time period or details of that incident. Patient is very vague in reference to treatment history but reports multiple admission for MH/SA issues in the past. Patient denies being on any MH medications for the last five years. Patient states he was admitted to [facility] in 2013 for depression and SA issues. Patient also reported he was in [facility] for three months prior to 2013. Patient will not elaborate on that admission. Patient denies any H/I or AVH this date. Patient is oriented to time/place. Patient is vague in reference to current S/I stating "I said enough I am over all of it." Admission note stated: "Per family member, the patient has been "having a breakdown" over the last several weeks. Family member states that this happened 5-6 years ago when the patient was undergoing major financial problems. Family member states that patient does not bathe or take care of himself and he just lays in his room. He also states that the patient talks about how he is a monster, has hurt too many people, and wants to disappear. Pt denies SI or HI to family member &states that he doesn't have the motivation  The patient was basically unable to provide assistance of his medication treatment history and "basically they don't work" He denied any current suicidal ideation at this interview. Associated Signs/Symptoms: Depression Symptoms:  depressed mood, anhedonia, insomnia, psychomotor agitation, fatigue, feelings of worthlessness/guilt, difficulty concentrating, hopelessness, suicidal thoughts without  plan, anxiety, loss of energy/fatigue, disturbed sleep, weight loss, (Hypo) Manic Symptoms:  Impulsivity, Anxiety Symptoms:  Excessive Worry, Psychotic Symptoms:  none PTSD Symptoms: Negative Total Time spent  with patient: 45 minutes  Past Psychiatric History: Patient has had at least one previous [facility]  Is the patient at risk to self? Yes.    Has the patient been a risk to self in the past 6 months? Yes.    Has the patient been a risk to self within the distant past? Yes.    Is the patient a risk to others? No.  Has the patient been a risk to others in the past 6 months? No.  Has the patient been a risk to others within the distant past? No.   Prior Inpatient Therapy:  Previous admission at [facility] in 2013 Prior Outpatient Therapy: unable to provide    Alcohol Screening: Patient refused Alcohol Screening Tool: Yes Substance Abuse History in the last 12 months:  Yes.   Consequences of Substance Abuse: homelessness, basically unemployed Family Consequences:  homelessness, unemployment Previous Psychotropic Medications: Yes  Psychological Evaluations: No  Past Medical History:  Past Medical History:  Diagnosis Date  . Anxiety   . Depression   . Drug addiction ([place])    History reviewed. No pertinent surgical history. Family History: History reviewed. No pertinent family history. Family Psychiatric  History: denied Tobacco Screening: Have you used any form of tobacco in the last 30 days? (Cigarettes, Smokeless Tobacco, Cigars, and/or Pipes): No Social History:  History  Alcohol Use  . 0.6 oz/week  . 1 Cans of beer per week    Comment: PAIN PILLS--PERCOCET/VICODIN     History  Drug Use  . Types: Other-see comments, "Crack" cocaine, Cocaine    Comment: smoking crack for the past week    Additional Social History:  Alcohol daily, does not smoke. Divorced, 2 adult children                         Allergies:  No Known Allergies Lab Results:  Results for orders placed or performed during the hospital encounter of 08/14/16 (from the past 48 hour(s))  CBC with Differential/Platelet     Status: None   Collection Time: 08/14/16  1:32 PM   Result Value Ref Range   WBC 8.1 4.0 - 10.5 K/uL   RBC 5.67 4.22 - 5.81 MIL/uL   Hemoglobin 16.7 13.0 - 17.0 g/dL   HCT 46.5 39.0 - 52.0 %   MCV 82.0 78.0 - 100.0 fL   MCH 29.5 26.0 - 34.0 pg   MCHC 35.9 30.0 - 36.0 g/dL   RDW 12.9 11.5 - 15.5 %   Platelets 213 150 - 400 K/uL   Neutrophils Relative % 74 %   Neutro Abs 6.0 1.7 - 7.7 K/uL   Lymphocytes Relative 16 %   Lymphs Abs 1.3 0.7 - 4.0 K/uL   Monocytes Relative 9 %   Monocytes Absolute 0.8 0.1 - 1.0 K/uL   Eosinophils Relative 0 %   Eosinophils Absolute 0.0 0.0 - 0.7 K/uL   Basophils Relative 1 %   Basophils Absolute 0.1 0.0 - 0.1 K/uL  Ethanol     Status: None   Collection Time: 08/14/16  1:37 PM  Result Value Ref Range   Alcohol, Ethyl (B) <5 <5 mg/dL    Comment:        LOWEST DETECTABLE LIMIT FOR SERUM ALCOHOL IS 5 mg/dL FOR  MEDICAL PURPOSES ONLY   I-stat chem 8, ed     Status: Abnormal   Collection Time: 08/14/16  1:39 PM  Result Value Ref Range   Sodium 139 135 - 145 mmol/L   Potassium 3.7 3.5 - 5.1 mmol/L   Chloride 103 101 - 111 mmol/L   BUN 15 6 - 20 mg/dL   Creatinine, Ser 0.80 0.61 - 1.24 mg/dL   Glucose, Bld 119 (H) 65 - 99 mg/dL   Calcium, Ion 1.04 (L) 1.15 - 1.40 mmol/L   TCO2 22 0 - 100 mmol/L   Hemoglobin 17.0 13.0 - 17.0 g/dL   HCT 50.0 39.0 - 52.0 %    Blood Alcohol level:  Lab Results  Component Value Date   [facility] <5 08/14/2016   ETH <11 [id number]    Metabolic Disorder Labs:  Lab Results  Component Value Date   HGBA1C 6.0 (H) 04/02/2012   MPG 126 (H) 04/02/2012   MPG 123 (H) 04/02/2012   No results found for: PROLACTIN Lab Results  Component Value Date   CHOL 330 (H) 04/02/2012   TRIG 244 (H) 04/02/2012   HDL 36 (L) 04/02/2012   CHOLHDL 9.2 04/02/2012   VLDL 49 (H) 04/02/2012   LDLCALC 245 (H) 04/02/2012   LDLCALC  08/25/2010    UNABLE TO CALCULATE IF TRIGLYCERIDE OVER 400 mg/dL        Total Cholesterol/HDL:CHD Risk Coronary Heart Disease Risk Table                     Men    Women  1/2 Average Risk   3.4   3.3  Average Risk       5.0   4.4  2 X Average Risk   9.6   7.1  3 X Average Risk  23.4   11.0        Use the calculated Patient Ratio above and the CHD Risk Table to determine the patient's CHD Risk.        ATP III CLASSIFICATION (LDL):  <100     mg/dL   Optimal  100-129  mg/dL   Near or Above                    Optimal  130-159  mg/dL   Borderline  160-189  mg/dL   High  >190     mg/dL   Very High    Current Medications: Current Facility-Administered Medications  Medication Dose Route Frequency Provider Last Rate Last Dose  . acetaminophen (TYLENOL) tablet 650 mg  650 mg Oral Q6H PRN Lurena Nida, NP      . busPIRone (BUSPAR) tablet 10 mg  10 mg Oral BID Lurena Nida, NP   10 mg at 08/16/16 0903  . hydrOXYzine (ATARAX/VISTARIL) tablet 25 mg  25 mg Oral TID PRN Lurena Nida, NP   25 mg at 08/16/16 0904  . sertraline (ZOLOFT) tablet 50 mg  50 mg Oral Daily Lurena Nida, NP   50 mg at 08/16/16 0903  . traZODone (DESYREL) tablet 50 mg  50 mg Oral QHS PRN Lurena Nida, NP   50 mg at 08/15/16 2217   PTA Medications: Prescriptions Prior to Admission  Medication Sig Dispense Refill Last Dose  . ibuprofen (ADVIL,MOTRIN) 200 MG tablet Take 600 mg by mouth every 6 (six) hours as needed for moderate pain.   Past Week at Unknown time  . FLUoxetine (PROZAC) 20 MG capsule Take 1 capsule (  20 mg total) by mouth daily. (Patient not taking: Reported on 08/14/2016) 30 capsule 0 Not Taking at Unknown time  . QUEtiapine (SEROQUEL) 400 MG tablet Take 2 tablets (800 mg total) by mouth at bedtime. (Patient not taking: Reported on 08/14/2016) 60 tablet 0 Not Taking at Unknown time    Musculoskeletal: Strength & Muscle Tone: within normal limits Gait & Station: normal Patient leans: N/A  Psychiatric Specialty Exam: Physical Exam  ROS  Blood pressure 133/88, pulse 89, temperature 98.2 F (36.8 C), resp. rate 20, height 5' 7.5" (1.715 m), weight 81.6 kg (180  lb).Body mass index is 27.78 kg/m.  General Appearance: Disheveled  Eye Contact:  Poor  Speech:  Slow  Volume:  Decreased  Mood:  Anxious and Depressed  Affect:  Congruent  Thought Process:  Linear  Orientation:  Full (Time, Place, and Person)  Thought Content:  NA  Suicidal Thoughts:  Yes.  without intent/plan  Homicidal Thoughts:  No  Memory:  Immediate;   Fair  Judgement:  Poor  Insight:  Lacking  Psychomotor Activity:  Restlessness  Concentration:  Concentration: Poor  Recall:  Poor  Fund of Knowledge:  Poor  Language:  NA  Akathisia:  Negative  Handed:  Right  AIMS (if indicated):     Assets:  Desire for Improvement  ADL's:  Intact  Cognition:  WNL  Sleep:  Number of Hours: 5.5    Treatment Plan Summary: Daily contact with patient to assess and evaluate symptoms and progress in treatment and Medication management  Observation Level/Precautions:  15 minute checks  Laboratory:  CBC Chemistry Profile Folic Acid GGT HbAIC HCG UDS UA  Psychotherapy:    Medications:    Consultations:    Discharge Concerns:    Estimated LOS:  Other:     Physician Treatment Plan for Primary Diagnosis: anxiety, depression, alcohol use disorder Long Term Goal(s): Improvement in symptoms so as ready for discharge  Short Term Goals: Ability to identify changes in lifestyle to reduce recurrence of condition will improve, Ability to verbalize feelings will improve, Ability to disclose and discuss suicidal ideas, Ability to demonstrate self-control will improve, Ability to identify and develop effective coping behaviors will improve, Ability to maintain clinical measurements within normal limits will improve, Compliance with prescribed medications will improve and Ability to identify triggers associated with substance abuse/mental health issues will improve  Physician Treatment Plan for Secondary Diagnosis: Active Problems:   MDD (major depressive disorder), recurrent severe, without  psychosis ([place])  Long Term Goal(s): Improvement in symptoms so as ready for discharge  Short Term Goals: Ability to identify changes in lifestyle to reduce recurrence of condition will improve, Ability to verbalize feelings will improve, Ability to disclose and discuss suicidal ideas, Ability to demonstrate self-control will improve, Ability to identify and develop effective coping behaviors will improve, Ability to maintain clinical measurements within normal limits will improve, Compliance with prescribed medications will improve and Ability to identify triggers associated with substance abuse/mental health issues will improve  I certify that inpatient services furnished can reasonably be expected to improve the patient's condition.    Sharma Covert, MD 2/19/20181:30 PM

## 2016-08-17 ENCOUNTER — Encounter (HOSPITAL_COMMUNITY): Payer: Self-pay | Admitting: Psychiatry

## 2016-08-17 MED ORDER — GABAPENTIN 300 MG PO CAPS
300.0000 mg | ORAL_CAPSULE | Freq: Three times a day (TID) | ORAL | Status: DC
  Administered 2016-08-17 – 2016-08-18 (×2): 300 mg via ORAL
  Filled 2016-08-17 (×6): qty 1

## 2016-08-17 MED ORDER — BUSPIRONE HCL 15 MG PO TABS
15.0000 mg | ORAL_TABLET | Freq: Three times a day (TID) | ORAL | Status: DC
  Administered 2016-08-17 – 2016-09-03 (×50): 15 mg via ORAL
  Filled 2016-08-17 (×10): qty 1
  Filled 2016-08-17: qty 21
  Filled 2016-08-17: qty 3
  Filled 2016-08-17 (×9): qty 1
  Filled 2016-08-17: qty 21
  Filled 2016-08-17 (×3): qty 1
  Filled 2016-08-17: qty 3
  Filled 2016-08-17 (×7): qty 1
  Filled 2016-08-17: qty 3
  Filled 2016-08-17 (×7): qty 1
  Filled 2016-08-17: qty 21
  Filled 2016-08-17 (×8): qty 1
  Filled 2016-08-17: qty 3
  Filled 2016-08-17 (×10): qty 1

## 2016-08-17 MED ORDER — QUETIAPINE FUMARATE 100 MG PO TABS
100.0000 mg | ORAL_TABLET | Freq: Every day | ORAL | Status: DC
  Administered 2016-08-17 – 2016-08-24 (×8): 100 mg via ORAL
  Filled 2016-08-17 (×13): qty 1

## 2016-08-17 MED ORDER — QUETIAPINE FUMARATE 200 MG PO TABS
200.0000 mg | ORAL_TABLET | Freq: Every day | ORAL | Status: DC
  Administered 2016-08-17: 200 mg via ORAL
  Filled 2016-08-17 (×2): qty 1

## 2016-08-17 NOTE — [facility] Group Notes (Signed)
[facility] LCSW Group Therapy  08/17/2016 2:52 PM  Type of Therapy:  Group Therapy  Participation Level:  Did Not Attend-pt invited. Chose to rest in room.   Summary of Progress/Problems: [facility] Speaker came to talk about his personal journey with substance abuse and addiction. The pt processed ways by which to relate to the speaker. [place] speaker provided handouts and educational information pertaining to groups and services offered by the [facility].   Navayah Sok N Smart LCSW 08/17/2016, 2:52 PM

## 2016-08-17 NOTE — Progress Notes (Signed)
Pt was sleep during group time.

## 2016-08-17 NOTE — Progress Notes (Signed)
Recreation Therapy Notes Animal-Assisted Activity (AAA) Program Checklist/Progress Notes Patient Eligibility Criteria Checklist & Daily Group note for Rec TxIntervention  Date: 02.20.2018 Time: 2:45pm Location: [address]    AAA/T Program Assumption of Risk Form signed by Patient/ or Parent Legal Guardian Yes  Patient is free of allergies or sever asthma Yes  Patient reports no fear of animals Yes  Patient reports no history of cruelty to animals Yes  Patient understands his/her participation is voluntary Yes  Patient washes hands before animal contact Yes  Patient washes hands after animal contact Yes  Behavioral Response: Did not attend.    Laureen Ochs Rinnah Peppel, LRT/CTRS           Lane Hacker 08/17/2016 2:55 PM

## 2016-08-17 NOTE — Progress Notes (Signed)
Patient ID: ESSEY BRESSLER, male   DOB: Dec 14, 1950, 66 y.o.   MRN: [id number] D: Client seen in room eating a late dinner, but eventually client comes out of room to take medications and encouraged to go sit in dayroom. Client reluctant to take medications reporting of Seroquel "made me feel funny in the head" A: Writer provided emotional support, reviewed medications, administered as ordered, but let client know he is not being forced to take the medication. Staff will monitor q80min for safety. R: Client encouraged to share concerns with physician in am. Client affect is a little brighter, made more eye contact today. Client went in the dayroom tonight and watched TV, no interaction with other clients. Client is safe on the unit.

## 2016-08-17 NOTE — Plan of Care (Signed)
Problem: Medication: Goal: Compliance with prescribed medication regimen will improve Outcome: Progressing Compliance with prescribed medication regimen will improve AEB reviewing medications, administering as prescribed, monitored for SE, none noted.

## 2016-08-17 NOTE — Progress Notes (Signed)
[facility] MD Progress Note  08/17/2016 10:53 AM Frank [name]  MRN:  [id number] Subjective:  Patient is a 66 YO male Principal Problem: depression, anxiety and alcohol use disorder Diagnosis:   Patient Active Problem List   Diagnosis Date Noted  . MDD (major depressive disorder), recurrent severe, without psychosis ([place]) [F33.2] 08/15/2016  . MDD (major depressive disorder) [F32.9] 05/20/2012  . Cocaine abuse [F14.10] 05/20/2012    Class: Chronic  . Chronic pain associated with significant psychosocial dysfunction [G89.4] 04/01/2012  . Anxiety [F41.9]   . Depression [F32.9]   . Drug addiction ([place]) [F19.20]    Total Time spent with patient: 20 minutes  Past Psychiatric History: see H&P  Past Medical History:  Past Medical History:  Diagnosis Date  . Anxiety   . Depression   . Drug addiction ([place])    History reviewed. No pertinent surgical history. Family History: History reviewed. No pertinent family history. Family Psychiatric  History: Patient has been admitted to [facility] in 2013. Patient also admitted to an admission to [facility] at an unspecified date Social History:  History  Alcohol Use  . 0.6 oz/week  . 1 Cans of beer per week    Comment: PAIN PILLS--PERCOCET/VICODIN     History  Drug Use  . Types: Other-see comments, "Crack" cocaine, Cocaine    Comment: smoking crack for the past week    Social History   Social History  . Marital status: Divorced    Spouse name: N/A  . Number of children: N/A  . Years of education: N/A   Social History Main Topics  . Smoking status: Never Smoker  . Smokeless tobacco: Never Used  . Alcohol use 0.6 oz/week    1 Cans of beer per week     Comment: PAIN PILLS--PERCOCET/VICODIN  . Drug use: Yes    Types: Other-see comments, "Crack" cocaine, Cocaine     Comment: smoking crack for the past week  . Sexual activity: Not Asked     Comment: oxycontin   Other Topics Concern  . None   Social History Narrative  . None   Additional Social  History:   see H&P                      Sleep: Poor  Appetite:  Fair  Current Medications: Current Facility-Administered Medications  Medication Dose Route Frequency Provider Last Rate Last Dose  . acetaminophen (TYLENOL) tablet 650 mg  650 mg Oral Q6H PRN Lurena Nida, NP      . busPIRone (BUSPAR) tablet 10 mg  10 mg Oral TID Sharma Covert, MD   10 mg at 08/16/16 1736  . gabapentin (NEURONTIN) capsule 200 mg  200 mg Oral TID Sharma Covert, MD   200 mg at 08/16/16 1736  . hydrOXYzine (ATARAX/VISTARIL) tablet 25 mg  25 mg Oral TID PRN Lurena Nida, NP   25 mg at 08/16/16 0904  . sertraline (ZOLOFT) tablet 50 mg  50 mg Oral Daily Lurena Nida, NP   50 mg at 08/16/16 0903  . traZODone (DESYREL) tablet 50 mg  50 mg Oral QHS PRN Lurena Nida, NP   50 mg at 08/16/16 2200    Lab Results:  Results for orders placed or performed during the hospital encounter of 08/15/16 (from the past 48 hour(s))  Glucose, capillary     Status: None   Collection Time: 08/16/16  4:57 PM  Result Value Ref Range   Glucose-Capillary 86 65 -  99 mg/dL   Comment 1 Notify RN    Comment 2 Document in Chart     Blood Alcohol level:  Lab Results  Component Value Date   ETH <5 08/14/2016   ETH <11 123XX123    Metabolic Disorder Labs: Lab Results  Component Value Date   HGBA1C 6.0 (H) 04/02/2012   MPG 126 (H) 04/02/2012   MPG 123 (H) 04/02/2012   No results found for: PROLACTIN Lab Results  Component Value Date   CHOL 330 (H) 04/02/2012   TRIG 244 (H) 04/02/2012   HDL 36 (L) 04/02/2012   CHOLHDL 9.2 04/02/2012   VLDL 49 (H) 04/02/2012   LDLCALC 245 (H) 04/02/2012   LDLCALC  08/25/2010    UNABLE TO CALCULATE IF TRIGLYCERIDE OVER 400 mg/dL        Total Cholesterol/HDL:CHD Risk Coronary Heart Disease Risk Table                     Men   Women  1/2 Average Risk   3.4   3.3  Average Risk       5.0   4.4  2 X Average Risk   9.6   7.1  3 X Average Risk  23.4   11.0        Use  the calculated Patient Ratio above and the CHD Risk Table to determine the patient's CHD Risk.        ATP III CLASSIFICATION (LDL):  <100     mg/dL   Optimal  100-129  mg/dL   Near or Above                    Optimal  130-159  mg/dL   Borderline  160-189  mg/dL   High  >190     mg/dL   Very High    Physical Findings: AIMS: Facial and Oral Movements Muscles of Facial Expression: None, normal Lips and Perioral Area: None, normal Jaw: None, normal Tongue: None, normal,Extremity Movements Upper (arms, wrists, hands, fingers): None, normal Lower (legs, knees, ankles, toes): None, normal, Trunk Movements Neck, shoulders, hips: None, normal, Overall Severity Severity of abnormal movements (highest score from questions above): None, normal Incapacitation due to abnormal movements: None, normal Patient's awareness of abnormal movements (rate only patient's report): No Awareness, Dental Status Current problems with teeth and/or dentures?: No Does patient usually wear dentures?: No  CIWA:  CIWA-Ar Total: 11 COWS:     Musculoskeletal: Strength & Muscle Tone: within normal limits Gait & Station: normal Patient leans: N/A  Psychiatric Specialty Exam: Physical Exam  ROS  Blood pressure 110/88, pulse 75, temperature 98.2 F (36.8 C), temperature source Oral, resp. rate 18, height 5' 7.5" (1.715 m), weight 81.6 kg (180 lb).Body mass index is 27.78 kg/m.  General Appearance: Disheveled  Eye Contact:  Poor  Speech:  Slow  Volume:  Decreased  Mood:  Anxious and Depressed  Affect:  Depressed  Thought Process:  Linear  Orientation:  Full (Time, Place, and Person)  Thought Content:  Rumination  Suicidal Thoughts:  No  Homicidal Thoughts:  No  Memory:  NA  Judgement:  Poor  Insight:  Lacking  Psychomotor Activity:  Increased  Concentration:  Concentration: Fair  Recall:  [employer] of Knowledge:  Fair  Language:  Fair  Akathisia:  No  Handed:  Right  AIMS (if indicated):      Assets:  Social Support  ADL's:  Impaired  Cognition:  WNL  Sleep:  Number of Hours: 5.5     Treatment Plan Summary: Daily contact with patient to assess and evaluate symptoms and progress in treatment, Medication management, Plan 1) patient did admit to previous admission to [facility] and note from [facility] reviewed in 2013. Patient remains guarded. He denied paranoia but that "things will not get better. Patient admitted to previous treatment with seroquel. He did not belived that it helped, but basically very worried about new medication. Will give 100 mg seroquel this am and then probably 300 mg at hs if he tolerates this. 2) increase buspar, 3) increase neurontin, 4) collateral information from brother, 5) monitor for withdrawal, 6) therapeutic [place] and 7) continue zoloft  Sharma Covert, MD 08/17/2016, 10:53 AM

## 2016-08-17 NOTE — Progress Notes (Signed)
DAR NOTE: Patient presents with irritable affect and depressed mood.  Refused to respond to most of the assessment.  Rates depression at 10, hopelessness at 10, and anxiety at 7.  Maintained on routine safety checks.  Medications given as prescribed.  Support and encouragement offered as needed. Patient remained withdrawn and isolated to his room most of this shift.  patient refused morning medications after several encouragement.

## 2016-08-18 ENCOUNTER — Encounter (HOSPITAL_COMMUNITY): Payer: Self-pay | Admitting: Psychiatry

## 2016-08-18 LAB — HEMOGLOBIN A1C
Hgb A1c MFr Bld: 5.7 % — ABNORMAL HIGH (ref 4.8–5.6)
MEAN PLASMA GLUCOSE: 117

## 2016-08-18 MED ORDER — QUETIAPINE FUMARATE 300 MG PO TABS
300.0000 mg | ORAL_TABLET | Freq: Every day | ORAL | Status: DC
  Administered 2016-08-18 – 2016-08-30 (×13): 300 mg via ORAL
  Filled 2016-08-18 (×15): qty 1

## 2016-08-18 MED ORDER — SERTRALINE HCL 50 MG PO TABS
75.0000 mg | ORAL_TABLET | Freq: Every day | ORAL | Status: DC
  Administered 2016-08-19 – 2016-08-22 (×4): 75 mg via ORAL
  Filled 2016-08-18 (×6): qty 1

## 2016-08-18 MED ORDER — GABAPENTIN 400 MG PO CAPS
400.0000 mg | ORAL_CAPSULE | Freq: Three times a day (TID) | ORAL | Status: DC
  Administered 2016-08-18 – 2016-09-01 (×41): 400 mg via ORAL
  Filled 2016-08-18 (×46): qty 1

## 2016-08-18 NOTE — Progress Notes (Signed)
Patient ID: Frank [name], male   DOB: September 16, 1950, 66 y.o.   MRN: [id number]   Pt currently presents with a flat affect and depressed behavior. Pt is isolative, evasive in his answers. When asked about his goal, pt states "I plan to work on one tomorrow." Pt reports good sleep with current medication regimen.   Pt provided with medications per providers orders. Pt's labs and vitals were monitored throughout the night. Pt given a 1:1 about emotional and mental status. Pt supported and encouraged to express concerns and questions. Pt educated on medications.  Pt's safety ensured with 15 minute and environmental checks. Pt currently denies SI/HI and A/V hallucinations. Pt verbally agrees to seek staff if SI/HI or A/VH occurs and to consult with staff before acting on any harmful thoughts. Will continue POC.

## 2016-08-18 NOTE — BHH Group Notes (Signed)
[place] LCSW Group Therapy  08/18/2016 3:55 PM  Type of Therapy:  Group Therapy  Participation Level:  Did Not Attend-Invited. pt continues to isolate in room; drowsy.   Summary of Progress/Problems: Today's Topic: Overcoming Obstacles. Patients identified one short term goal and potential obstacles in reaching this goal. Patients processed barriers involved in overcoming these obstacles. Patients identified steps necessary for overcoming these obstacles and explored motivation (internal and external) for facing these difficulties head on.   Celestina Gironda N Smart LCSW 08/18/2016, 3:55 PM

## 2016-08-18 NOTE — Progress Notes (Signed)
Recreation Therapy Notes  Date: 08/18/16 Time: 0930 Location: 300 Hall Group Room  Group Topic: Stress Management  Goal Area(s) Addresses:  Patient will verbalize importance of using healthy stress management.  Patient will identify positive emotions associated with healthy stress management.   Intervention: Stress Management  Activity :  Meditation.  LRT introduced the stress management technique of meditation.  LRT played a meditation from the Calm app to allows patients to engage and learn the benefits of meditation.  Patiens were to follow along as the meditation played to engage in the technique.  Education:  Stress Management, Discharge Planning.   Education Outcome: Acknowledges edcuation/In group clarification offered/Needs additional education  Clinical Observations/Feedback: Pt did not attend group.    Victorino Sparrow, LRT/CTRS         Victorino Sparrow A 08/18/2016 12:39 PM

## 2016-08-18 NOTE — Progress Notes (Signed)
D:  Patient's self inventory sheet, patient sleeps good, sleep medication given.  Poor appetite, low energy level, poor concentration.  Rated depression and hopeless 8, anxiety 5.  Denied withdrawals.  Denied SI.  "Medicines make me feel very tired.'  Denied pain.  Goal is to have more energy today.  Plans to eat and attend groups.  No discharge plans. A:  Medications administered per MD orders.  Emotional support and encouragement given patient. R:  Denied SI and HI, contracts for safety.  Denied A./V hallucinations.  Safety maintained with 15 minute checks.

## 2016-08-18 NOTE — Progress Notes (Signed)
[facility] MD Progress Note  08/18/2016 12:06 PM Frank [name]  MRN:  [id number] Subjective:  Patient is a 66 YO male Principal Problem: depression, anxiety and alcohol use disorder Diagnosis:   Patient Active Problem List   Diagnosis Date Noted  . MDD (major depressive disorder), recurrent severe, without psychosis ([place]) [F33.2] 08/15/2016  . MDD (major depressive disorder) [F32.9] 05/20/2012  . Cocaine abuse [F14.10] 05/20/2012    Class: Chronic  . Chronic pain associated with significant psychosocial dysfunction [G89.4] 04/01/2012  . Anxiety [F41.9]   . Depression [F32.9]   . Drug addiction ([place]) [F19.20]    Total Time spent with patient: 20 minutes  Past Psychiatric History: see H&P  Past Medical History:  Past Medical History:  Diagnosis Date  . Anxiety   . Depression   . Drug addiction (Frank [name])    History reviewed. No pertinent surgical history. Family History: History reviewed. No pertinent family history. Family Psychiatric  History: Patient has been admitted to [facility] in 2013. Patient also admitted to an admission to [facility] at an unspecified date Social History:  History  Alcohol Use  . 0.6 oz/week  . 1 Cans of beer per week    Comment: PAIN PILLS--PERCOCET/VICODIN     History  Drug Use  . Types: Other-see comments, "Crack" cocaine, Cocaine    Comment: smoking crack for the past week    Social History   Social History  . Marital status: Divorced    Spouse name: N/A  . Number of children: N/A  . Years of education: N/A   Social History Main Topics  . Smoking status: Never Smoker  . Smokeless tobacco: Never Used  . Alcohol use 0.6 oz/week    1 Cans of beer per week     Comment: PAIN PILLS--PERCOCET/VICODIN  . Drug use: Yes    Types: Other-see comments, "Crack" cocaine, Cocaine     Comment: smoking crack for the past week  . Sexual activity: Not Asked     Comment: oxycontin   Other Topics Concern  . None   Social History Narrative  . None   Additional Social  History:   see H&P                      Sleep: improved  Appetite:  Fair  Current Medications: Current Facility-Administered Medications  Medication Dose Route Frequency Provider Last Rate Last Dose  . acetaminophen (TYLENOL) tablet 650 mg  650 mg Oral Q6H PRN Lurena Nida, NP      . busPIRone (BUSPAR) tablet 15 mg  15 mg Oral TID Sharma Covert, MD   15 mg at 08/18/16 1132  . gabapentin (NEURONTIN) capsule 400 mg  400 mg Oral TID Sharma Covert, MD   400 mg at 08/18/16 1132  . hydrOXYzine (ATARAX/VISTARIL) tablet 25 mg  25 mg Oral TID PRN Lurena Nida, NP   25 mg at 08/16/16 0904  . QUEtiapine (SEROQUEL) tablet 100 mg  100 mg Oral Daily Sharma Covert, MD   100 mg at 08/18/16 0831  . QUEtiapine (SEROQUEL) tablet 300 mg  300 mg Oral QHS Sharma Covert, MD      . Derrill Memo ON 08/19/2016] sertraline (ZOLOFT) tablet 75 mg  75 mg Oral Daily Sharma Covert, MD      . traZODone (DESYREL) tablet 50 mg  50 mg Oral QHS PRN Lurena Nida, NP   50 mg at 08/17/16 2204    Lab Results:  Results  for orders placed or performed during the hospital encounter of 08/15/16 (from the past 48 hour(s))  Glucose, capillary     Status: None   Collection Time: 08/16/16  4:57 PM  Result Value Ref Range   Glucose-Capillary 86 65 - 99 mg/dL   Comment 1 Notify RN    Comment 2 Document in Chart   Hemoglobin A1c     Status: Abnormal   Collection Time: 08/17/16  6:33 AM  Result Value Ref Range   Hgb A1c MFr Bld 5.7 (H) 4.8 - 5.6 %    Comment: (NOTE)         Pre-diabetes: 5.7 - 6.4         Diabetes: >6.4         Glycemic control for adults with diabetes: <7.0    Mean Plasma Glucose 117     Comment: (NOTE) Performed At: [facility] [place], [place] [id number] Lindon Romp MD [id number] Performed at [facility], [place] [address], [place], [place] [address]     Blood Alcohol level:  Lab Results  Component Value Date   [facility] <5  08/14/2016   ETH <11 123XX123    Metabolic Disorder Labs: Lab Results  Component Value Date   HGBA1C 5.7 (H) 08/17/2016   MPG 117 08/17/2016   MPG 126 (H) 04/02/2012   No results found for: PROLACTIN Lab Results  Component Value Date   CHOL 330 (H) 04/02/2012   TRIG 244 (H) 04/02/2012   HDL 36 (L) 04/02/2012   CHOLHDL 9.2 04/02/2012   VLDL 49 (H) 04/02/2012   LDLCALC 245 (H) 04/02/2012   LDLCALC  08/25/2010    UNABLE TO CALCULATE IF TRIGLYCERIDE OVER 400 mg/dL        Total Cholesterol/HDL:CHD Risk Coronary Heart Disease Risk Table                     Men   Women  1/2 Average Risk   3.4   3.3  Average Risk       5.0   4.4  2 X Average Risk   9.6   7.1  3 X Average Risk  23.4   11.0        Use the calculated Patient Ratio above and the CHD Risk Table to determine the patient's CHD Risk.        ATP III CLASSIFICATION (LDL):  <100     mg/dL   Optimal  100-129  mg/dL   Near or Above                    Optimal  130-159  mg/dL   Borderline  160-189  mg/dL   High  >190     mg/dL   Very High    Physical Findings: AIMS: Facial and Oral Movements Muscles of Facial Expression: None, normal Lips and Perioral Area: None, normal Jaw: None, normal Tongue: None, normal,Extremity Movements Upper (arms, wrists, hands, fingers): None, normal Lower (legs, knees, ankles, toes): None, normal, Trunk Movements Neck, shoulders, hips: None, normal, Overall Severity Severity of abnormal movements (highest score from questions above): None, normal Incapacitation due to abnormal movements: None, normal Patient's awareness of abnormal movements (rate only patient's report): No Awareness, Dental Status Current problems with teeth and/or dentures?: No Does patient usually wear dentures?: No  CIWA:  CIWA-Ar Total: 11 COWS:     Musculoskeletal: Strength & Muscle Tone: within normal limits Gait & Station: normal Patient leans:  N/A  Psychiatric Specialty Exam: Physical Exam   Neurological: Abnormal muscle tone: improved.    ROS  Blood pressure 112/80, pulse 91, temperature 97.5 F (36.4 C), temperature source Oral, resp. rate 16, height 5' 7.5" (1.715 m), weight 81.6 kg (180 lb).Body mass index is 27.78 kg/m.  General Appearance: Disheveled  Eye Contact:  Poor  Speech:  Slow but more speech today  Volume:  Decreased  Mood:  Anxious and Depressed but more talkative today  Affect:  Depressed  Thought Process:  Linear  Orientation:  Full (Time, Place, and Person)  Thought Content:  Rumination  Suicidal Thoughts:  No  Homicidal Thoughts:  No  Memory:  NA  Judgement:  Poor  Insight:  Lacking  Psychomotor Activity:  Increased  Concentration:  Concentration: Fair  Recall:  [employer] of Knowledge:  Fair  Language:  Fair  Akathisia:  No  Handed:  Right  AIMS (if indicated):     Assets:  Social Support  ADL's:  Impaired  Cognition:  WNL  Sleep:  Number of Hours: 6     Treatment Plan Summary: Patient is seen and examined. Patient is slightly improved from yesterday. He is speaking a bit more, but remains quite guarded. He did take the seroquel last night and slept better. We will increase his neurontin and seroquel. He has really only been on the zoloft for a couple of adys but I am going to increase it slightly to 75 mg po q day.   Sharma Covert, MD 08/18/2016, 12:06 PMPatient ID: Frank [name], male   DOB: 1950/09/07, 66 y.o.   MRN: [id number]

## 2016-08-18 NOTE — Plan of Care (Signed)
Problem: Education: Goal: Utilization of techniques to improve thought processes will improve Outcome: Progressing Nurse discussed depression/anxiety/coping skills with patient.    

## 2016-08-19 ENCOUNTER — Encounter (HOSPITAL_COMMUNITY): Payer: Self-pay | Admitting: Psychiatry

## 2016-08-19 NOTE — BHH Group Notes (Signed)
The focus of this group is to educate the patient on the purpose and policies of crisis stabilization and provide a format to answer questions about their admission.  The group details unit policies and expectations of patients while admitted.  Patient did not attend group. 

## 2016-08-19 NOTE — Progress Notes (Signed)
Patient ID: STOY STROUSE, male   DOB: 03-08-1951, 66 y.o.   MRN: [id number]  Pt currently presents with an agitated affect and guarded behavior. Pt reports to writer that their goal is to "sleep more." Pt states "I was up more and went to the group tonight." Pt requests that he wants to take more sleep medication, feels his Seroquel is sedating.   Pt provided with medications per providers orders. Pt's labs and vitals were monitored throughout the night. Pt given a 1:1 about emotional and mental status. Pt supported and encouraged to express concerns and questions. Pt educated on medications.   Pt's safety ensured with 15 minute and environmental checks. Pt currently denies SI/HI and A/V hallucinations. Pt verbally agrees to seek staff if SI/HI or A/VH occurs and to consult with staff before acting on any harmful thoughts. Will continue POC.

## 2016-08-19 NOTE — Progress Notes (Signed)
Patient has been isolative to room for the majority of the shift.  Patient has complained of feeling tired and having general withdrawal symptoms.  Patient denies SI, HI and AVH at this time.  Patient has not attended groups but has been compliant with medications.   Assess patient for safety, offer medications as prescribed, engage patient in 1:1 staff talks.   Continue to monitor as prescribed. Patient able to contract for safety.

## 2016-08-19 NOTE — BHH Group Notes (Signed)
[place] LCSW Group Therapy  08/19/2016 2:30 PM  Type of Therapy:  Group Therapy  Participation Level:  Did Not Attend-pt invited. Chose to remain in bed.   Summary of Progress/Problems: [facility] Speaker came to talk about his personal journey with substance abuse and addiction. The pt processed ways by which to relate to the speaker. [place] speaker provided handouts and educational information pertaining to groups and services offered by the [facility].   Joshlyn Beadle N Smart LCSW 08/19/2016, 2:30 PM

## 2016-08-19 NOTE — Progress Notes (Signed)
Patient attended group and said that his day was a 5. He had a bad day , but was excited to be in group.

## 2016-08-19 NOTE — Progress Notes (Signed)
[facility] MD Progress Note  08/19/2016 11:53 AM KENROY VICENCIO  MRN:  [id number] Subjective:  Patient is a 66 YO male Principal Problem: depression, anxiety and alcohol use disorder Diagnosis:   Patient Active Problem List   Diagnosis Date Noted  . MDD (major depressive disorder), recurrent severe, without psychosis ([place]) [F33.2] 08/15/2016  . MDD (major depressive disorder) [F32.9] 05/20/2012  . Cocaine abuse [F14.10] 05/20/2012    Class: Chronic  . Chronic pain associated with significant psychosocial dysfunction [G89.4] 04/01/2012  . Anxiety [F41.9]   . Depression [F32.9]   . Drug addiction ([place]) [F19.20]    Total Time spent with patient: 20 minutes  Past Psychiatric History: see H&P  Past Medical History:  Past Medical History:  Diagnosis Date  . Anxiety   . Depression   . Drug addiction ([place])    History reviewed. No pertinent surgical history. Family History: History reviewed. No pertinent family history. Family Psychiatric  History: Patient has been admitted to [facility] in 2013. Patient also admitted to an admission to [facility] at an unspecified date Social History:  History  Alcohol Use  . 0.6 oz/week  . 1 Cans of beer per week    Comment: PAIN PILLS--PERCOCET/VICODIN     History  Drug Use  . Types: Other-see comments, "Crack" cocaine, Cocaine    Comment: smoking crack for the past week    Social History   Social History  . Marital status: Divorced    Spouse name: N/A  . Number of children: N/A  . Years of education: N/A   Social History Main Topics  . Smoking status: Never Smoker  . Smokeless tobacco: Never Used  . Alcohol use 0.6 oz/week    1 Cans of beer per week     Comment: PAIN PILLS--PERCOCET/VICODIN  . Drug use: Yes    Types: Other-see comments, "Crack" cocaine, Cocaine     Comment: smoking crack for the past week  . Sexual activity: Not Asked     Comment: oxycontin   Other Topics Concern  . None   Social History Narrative  . None   Additional Social  History:   see H&P                      Sleep: improved  Appetite:  Fair  Current Medications: Current Facility-Administered Medications  Medication Dose Route Frequency Provider Last Rate Last Dose  . acetaminophen (TYLENOL) tablet 650 mg  650 mg Oral Q6H PRN Lurena Nida, NP      . busPIRone (BUSPAR) tablet 15 mg  15 mg Oral TID Sharma Covert, MD   15 mg at 08/19/16 0744  . gabapentin (NEURONTIN) capsule 400 mg  400 mg Oral TID Sharma Covert, MD   400 mg at 08/19/16 [id number]  . hydrOXYzine (ATARAX/VISTARIL) tablet 25 mg  25 mg Oral TID PRN Lurena Nida, NP   25 mg at 08/16/16 0904  . QUEtiapine (SEROQUEL) tablet 100 mg  100 mg Oral Daily Sharma Covert, MD   100 mg at 08/19/16 0744  . QUEtiapine (SEROQUEL) tablet 300 mg  300 mg Oral QHS Sharma Covert, MD   300 mg at 08/18/16 2159  . sertraline (ZOLOFT) tablet 75 mg  75 mg Oral Daily Sharma Covert, MD   75 mg at 08/19/16 [id number]  . traZODone (DESYREL) tablet 50 mg  50 mg Oral QHS PRN Lurena Nida, NP   50 mg at 08/17/16 2204    Lab  Results:  No results found for this or any previous visit (from the past 48 hour(s)).  Blood Alcohol level:  Lab Results  Component Value Date   [facility] <5 08/14/2016   ETH <11 [id number]    Metabolic Disorder Labs: Lab Results  Component Value Date   HGBA1C 5.7 (H) 08/17/2016   MPG 117 08/17/2016   MPG 126 (H) 04/02/2012   No results found for: PROLACTIN Lab Results  Component Value Date   CHOL 330 (H) 04/02/2012   TRIG 244 (H) 04/02/2012   HDL 36 (L) 04/02/2012   CHOLHDL 9.2 04/02/2012   VLDL 49 (H) 04/02/2012   LDLCALC 245 (H) 04/02/2012   LDLCALC  08/25/2010    UNABLE TO CALCULATE IF TRIGLYCERIDE OVER 400 mg/dL        Total Cholesterol/HDL:CHD Risk Coronary Heart Disease Risk Table                     Men   Women  1/2 Average Risk   3.4   3.3  Average Risk       5.0   4.4  2 X Average Risk   9.6   7.1  3 X Average Risk  23.4   11.0        Use the calculated  Patient Ratio above and the CHD Risk Table to determine the patient's CHD Risk.        ATP III CLASSIFICATION (LDL):  <100     mg/dL   Optimal  100-129  mg/dL   Near or Above                    Optimal  130-159  mg/dL   Borderline  160-189  mg/dL   High  >190     mg/dL   Very High    Physical Findings: AIMS: Facial and Oral Movements Muscles of Facial Expression: None, normal Lips and Perioral Area: None, normal Jaw: None, normal Tongue: None, normal,Extremity Movements Upper (arms, wrists, hands, fingers): None, normal Lower (legs, knees, ankles, toes): None, normal, Trunk Movements Neck, shoulders, hips: None, normal, Overall Severity Severity of abnormal movements (highest score from questions above): None, normal Incapacitation due to abnormal movements: None, normal Patient's awareness of abnormal movements (rate only patient's report): No Awareness, Dental Status Current problems with teeth and/or dentures?: No Does patient usually wear dentures?: No  CIWA:  CIWA-Ar Total: 1 COWS:  COWS Total Score: 1  Musculoskeletal: Strength & Muscle Tone: within normal limits Gait & Station: normal Patient leans: N/A  Psychiatric Specialty Exam: Physical Exam  Nursing note and vitals reviewed. Neurological: Abnormal muscle tone: improved.    ROS  Blood pressure 110/74, pulse 87, temperature 98.6 F (37 C), temperature source Oral, resp. rate 16, height 5' 7.5" (1.715 m), weight 81.6 kg (180 lb).Body mass index is 27.78 kg/m.  General Appearance: Disheveled  Eye Contact:  Poor but improving  Speech:  Slow but more speech today  Volume:  Decreased  Mood:  Anxious and Depressed but more talkative today  Affect:  Depressed  Thought Process:  Linear  Orientation:  Full (Time, Place, and Person)  Thought Content:  Rumination  Suicidal Thoughts:  No  Homicidal Thoughts:  No  Memory:  NA  Judgement:  Poor  Insight:  Lacking  Psychomotor Activity:  Increased  Concentration:   Concentration: Fair  Recall:  [employer] of Knowledge:  Fair  Language:  Fair  Akathisia:  No  Handed:  Right  AIMS (if indicated):     Assets:  Social Support  ADL's:  Impaired  Cognition:  WNL  Sleep:  Number of Hours: 6.5     Treatment Plan Summary: Patient is seen and examined. On 2/21 patient was slightly improved from yesterday. He is speaking a bit more, but remains quite guarded. He did take the seroquel last night and slept better. We will increase his neurontin and seroquel. He has really only been on the zoloft for a couple of adys but I am going to increase it slightly to 75 mg po q day.  On 2/22 the patient is seen and examined. He is more awake and talkative. His hygiene remains poor. He talks about being overwhelmed by bills, stressors, "poor decisions" that he has made previously. 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 Denies SI and psychotic symptoms currently. Anxiety is improving. Continue with current medication regime.   Sharma Covert, MD 08/19/2016, 11:53 AMPatient ID: Iris Pert, male   DOB: 03-16-1951, 66 y.o.   MRN: [id number] Patient ID: ROMMY ALFORD, male   DOB: [birth year], 66 y.o.   MRN: [id number]

## 2016-08-20 ENCOUNTER — Encounter (HOSPITAL_COMMUNITY): Payer: Self-pay | Admitting: Psychiatry

## 2016-08-20 NOTE — Progress Notes (Signed)
D: Pt presents in a guarded anxious mood. Pt noted to have body odor. When asked pt states that he took a shower yesterday. Claims that he slept terribly. Denied SI/HI/AVH.   A: Safety checks maintained.   R: Pt verbalized no concerns. Took all meds without incident.

## 2016-08-20 NOTE — Tx Team (Signed)
Interdisciplinary Treatment and Diagnostic Plan Update  08/20/2016 Time of Session: 0930 Frank [name] MRN: [id number]  Principal Diagnosis: MDD without psychotic features  Secondary Diagnoses: Active Problems:   MDD (major depressive disorder), recurrent severe, without psychosis (H[facility])   Current Medications:  Current Facility-Administered Medications  Medication Dose Route Frequency Provider Last Rate Last Dose  . acetaminophen (TYLENOL) tablet 650 mg  650 mg Oral Q6H PRN Fran E Hobson, NP      . busPIRone (BUSPAR) tablet 15 mg  15 mg Oral TID Greg Lawson Clary, MD   15 mg at 08/20/16 0834  . gabapentin (NEURONTIN) capsule 400 mg  400 mg Oral TID Greg Lawson Clary, MD   400 mg at 08/20/16 0834  . hydrOXYzine (ATARAX/VISTARIL) tablet 25 mg  25 mg Oral TID PRN Fran E Hobson, NP   25 mg at 08/16/16 0904  . QUEtiapine (SEROQUEL) tablet 100 mg  100 mg Oral Daily Greg Lawson Clary, MD   100 mg at 08/20/16 0834  . QUEtiapine (SEROQUEL) tablet 300 mg  300 mg Oral QHS Greg Lawson Clary, MD   300 mg at 08/19/16 2139  . sertraline (ZOLOFT) tablet 75 mg  75 mg Oral Daily Greg Lawson Clary, MD   75 mg at 08/20/16 0834  . traZODone (DESYREL) tablet 50 mg  50 mg Oral QHS PRN Fran E Hobson, NP   50 mg at 08/19/16 2140   PTA Medications: Prescriptions Prior to Admission  Medication Sig Dispense Refill Last Dose  . ibuprofen (ADVIL,MOTRIN) 200 MG tablet Take 600 mg by mouth every 6 (six) hours as needed for moderate pain.   Past Week at Unknown time  . FLUoxetine (PROZAC) 20 MG capsule Take 1 capsule (20 mg total) by mouth daily. (Patient not taking: Reported on 08/14/2016) 30 capsule 0 Not Taking at Unknown time  . QUEtiapine (SEROQUEL) 400 MG tablet Take 2 tablets (800 mg total) by mouth at bedtime. (Patient not taking: Reported on 08/14/2016) 60 tablet 0 Not Taking at Unknown time    Patient Stressors: Financial difficulties Marital or family conflict Other: Unspecified  Patient Strengths: Average  or above average intelligence Communication skills Supportive family/friends  Treatment Modalities: Medication Management, Group therapy, Case management,  1 to 1 session with clinician, Psychoeducation, Recreational therapy.   Physician Treatment Plan for Primary Diagnosis:MDD without psychotic features Long Term Goal(s): Improvement in symptoms so as ready for discharge Improvement in symptoms so as ready for discharge   Short Term Goals: Ability to identify changes in lifestyle to reduce recurrence of condition will improve Ability to verbalize feelings will improve Ability to disclose and discuss suicidal ideas Ability to demonstrate self-control will improve Ability to identify and develop effective coping behaviors will improve Ability to maintain clinical measurements within normal limits will improve Compliance with prescribed medications will improve Ability to identify triggers associated with substance abuse/mental health issues will improve Ability to identify changes in lifestyle to reduce recurrence of condition will improve Ability to verbalize feelings will improve Ability to disclose and discuss suicidal ideas Ability to demonstrate self-control will improve Ability to identify and develop effective coping behaviors will improve Ability to maintain clinical measurements within normal limits will improve Compliance with prescribed medications will improve Ability to identify triggers associated with substance abuse/mental health issues will improve  Medication Management: Evaluate patient's response, side effects, and tolerance of medication regimen.  Therapeutic Interventions: 1 to 1 sessions, Unit Group sessions and Medication administration.  Evaluation of Outcomes: Progressing  Physician Treatment   Plan for Secondary Diagnosis: Active Problems:   MDD (major depressive disorder), recurrent severe, without psychosis ([place])  Long Term Goal(s): Improvement in  symptoms so as ready for discharge Improvement in symptoms so as ready for discharge   Short Term Goals: Ability to identify changes in lifestyle to reduce recurrence of condition will improve Ability to verbalize feelings will improve Ability to disclose and discuss suicidal ideas Ability to demonstrate self-control will improve Ability to identify and develop effective coping behaviors will improve Ability to maintain clinical measurements within normal limits will improve Compliance with prescribed medications will improve Ability to identify triggers associated with substance abuse/mental health issues will improve Ability to identify changes in lifestyle to reduce recurrence of condition will improve Ability to verbalize feelings will improve Ability to disclose and discuss suicidal ideas Ability to demonstrate self-control will improve Ability to identify and develop effective coping behaviors will improve Ability to maintain clinical measurements within normal limits will improve Compliance with prescribed medications will improve Ability to identify triggers associated with substance abuse/mental health issues will improve     Medication Management: Evaluate patient's response, side effects, and tolerance of medication regimen.  Therapeutic Interventions: 1 to 1 sessions, Unit Group sessions and Medication administration.  Evaluation of Outcomes: Progressing   RN Treatment Plan for Primary Diagnosis: MDD without psychotic features Long Term Goal(s): Knowledge of disease and therapeutic regimen to maintain health will improve  Short Term Goals: Ability to remain free from injury will improve, Ability to identify and develop effective coping behaviors will improve and Compliance with prescribed medications will improve  Medication Management: RN will administer medications as ordered by provider, will assess and evaluate patient's response and provide education to patient for  prescribed medication. RN will report any adverse and/or side effects to prescribing provider.  Therapeutic Interventions: 1 on 1 counseling sessions, Psychoeducation, Medication administration, Evaluate responses to treatment, Monitor vital signs and CBGs as ordered, Perform/monitor CIWA, COWS, AIMS and Fall Risk screenings as ordered, Perform wound care treatments as ordered.  Evaluation of Outcomes: Progressing   LCSW Treatment Plan for Primary Diagnosis: MDD without psychotic features Long Term Goal(s): Safe transition to appropriate next level of care at discharge, Engage patient in therapeutic group addressing interpersonal concerns.  Short Term Goals: Engage patient in aftercare planning with referrals and resources, Facilitate patient progression through stages of change regarding substance use diagnoses and concerns and Identify triggers associated with mental health/substance abuse issues  Therapeutic Interventions: Assess for all discharge needs, 1 to 1 time with Social worker, Explore available resources and support systems, Assess for adequacy in community support network, Educate family and significant other(s) on suicide prevention, Complete Psychosocial Assessment, Interpersonal group therapy.  Evaluation of Outcomes: Not met   Progress in Treatment: Attending groups: No. Pt continues to isolate in room.  Participating in groups: No. Taking medication as prescribed: Yes. Toleration medication: Yes. Family/Significant other contact made: SPE completed with pt; pt declined to consent to family contact.  Patient understands diagnosis: Yes. Discussing patient identified problems/goals with staff: Yes. Medical problems stabilized or resolved: Yes. Denies suicidal/homicidal ideation: Yes. Issues/concerns per patient self-inventory: No. Other: n/a   New problem(s) identified: No, Describe:  n/a  New Short Term/Long Term Goal(s): detox; medication stabilization; development of  comprehensive mental wellness/sobriety plan.   Discharge Plan or Barriers: CSW assessing for appropriate referrals. He was last admitted to [facility] in 2013 and referred to [facility]. Pt reports that he has not followed up with outpatient  provider or received psychiatric medications in the past 5 years.  2/23: Pt will follow-up at [facility]; homeless; pt lethargic and not vested in discussing follow-up at this time.   Reason for Continuation of Hospitalization: Depression Medication stabilization  Estimated Length of Stay: 2-4 days   Attendees: Patient: 08/20/2016 10:24 AM  Physician: Dr. Mallie Darting MD 08/20/2016 10:24 AM  Nursing: Clarene Critchley RN; [place] RN 08/20/2016 10:24 AM  RN Care Manager: Lars Pinks CM 08/20/2016 10:24 AM  Social Worker: Maxie Better, LCSW; Matthew Saras, [place] 08/20/2016 10:24 AM  Recreational Therapist: Rhunette Croft 08/20/2016 10:24 AM  Other: Lindell Spar NP; Samuel Jester NP 08/20/2016 10:24 AM  Other:  08/20/2016 10:24 AM  Other: 08/20/2016 10:24 AM    Scribe for Treatment Team: [place], LCSW 08/20/2016 10:24 AM

## 2016-08-20 NOTE — Progress Notes (Signed)
Patient did not attend AA group meeting. 

## 2016-08-20 NOTE — [facility] Suicide Risk Assessment (Signed)
[place] INPATIENT:  Family/Significant Other Suicide Prevention Education  Suicide Prevention Education:  Patient Refusal for Family/Significant Other Suicide Prevention Education: The patient Frank [name] has refused to provide written consent for family/significant other to be provided Family/Significant Other Suicide Prevention Education during admission and/or prior to discharge.  Physician notified.  SPE completed with pt, as pt refused to consent to family contact. SPI pamphlet provided to pt and pt was encouraged to share information with support network, ask questions, and talk about any concerns relating to SPE. Pt denies access to guns/firearms and verbalized understanding of information provided. Mobile Crisis information also provided to pt.   Genifer Lazenby N Smart LCSW 08/20/2016, 10:36 AM

## 2016-08-20 NOTE — Progress Notes (Signed)
[facility] MD Progress Note  08/20/2016 11:33 AM Frank [name]  MRN:  [id number] Subjective:  Patient is a 66 YO male Principal Problem: depression, anxiety and alcohol use disorder Diagnosis:   Patient Active Problem List   Diagnosis Date Noted  . MDD (major depressive disorder), recurrent severe, without psychosis ([place]) [F33.2] 08/15/2016  . MDD (major depressive disorder) [F32.9] 05/20/2012  . Cocaine abuse [F14.10] 05/20/2012    Class: Chronic  . Chronic pain associated with significant psychosocial dysfunction [G89.4] 04/01/2012  . Anxiety [F41.9]   . Depression [F32.9]   . Drug addiction ([place]) [F19.20]    Total Time spent with patient: 20 minutes  Past Psychiatric History: see H&P  Past Medical History:  Past Medical History:  Diagnosis Date  . Anxiety   . Depression   . Drug addiction ([place])    History reviewed. No pertinent surgical history. Family History: History reviewed. No pertinent family history. Family Psychiatric  History: Patient has been admitted to [facility] in 2013. Patient also admitted to an admission to [facility] at an unspecified date Social History:  History  Alcohol Use  . 0.6 oz/week  . 1 Cans of beer per week    Comment: PAIN PILLS--PERCOCET/VICODIN     History  Drug Use  . Types: Other-see comments, "Crack" cocaine, Cocaine    Comment: smoking crack for the past week    Social History   Social History  . Marital status: Divorced    Spouse name: N/A  . Number of children: N/A  . Years of education: N/A   Social History Main Topics  . Smoking status: Never Smoker  . Smokeless tobacco: Never Used  . Alcohol use 0.6 oz/week    1 Cans of beer per week     Comment: PAIN PILLS--PERCOCET/VICODIN  . Drug use: Yes    Types: Other-see comments, "Crack" cocaine, Cocaine     Comment: smoking crack for the past week  . Sexual activity: Not Asked     Comment: oxycontin   Other Topics Concern  . None   Social History Narrative  . None   Additional Social  History:   see H&P                      Sleep: improved  Appetite:  Fair  Current Medications: Current Facility-Administered Medications  Medication Dose Route Frequency Provider Last Rate Last Dose  . acetaminophen (TYLENOL) tablet 650 mg  650 mg Oral Q6H PRN Lurena Nida, NP      . busPIRone (BUSPAR) tablet 15 mg  15 mg Oral TID Sharma Covert, MD   15 mg at 08/20/16 0834  . gabapentin (NEURONTIN) capsule 400 mg  400 mg Oral TID Sharma Covert, MD   400 mg at 08/20/16 [id number]  . hydrOXYzine (ATARAX/VISTARIL) tablet 25 mg  25 mg Oral TID PRN Lurena Nida, NP   25 mg at 08/16/16 0904  . QUEtiapine (SEROQUEL) tablet 100 mg  100 mg Oral Daily Sharma Covert, MD   100 mg at 08/20/16 0834  . QUEtiapine (SEROQUEL) tablet 300 mg  300 mg Oral QHS Sharma Covert, MD   300 mg at 08/19/16 2139  . sertraline (ZOLOFT) tablet 75 mg  75 mg Oral Daily Sharma Covert, MD   75 mg at 08/20/16 0834  . traZODone (DESYREL) tablet 50 mg  50 mg Oral QHS PRN Lurena Nida, NP   50 mg at 08/19/16 2140    Lab  Results:  No results found for this or any previous visit (from the past 48 hour(s)).  Blood Alcohol level:  Lab Results  Component Value Date   [facility] <5 08/14/2016   ETH <11 [id number]    Metabolic Disorder Labs: Lab Results  Component Value Date   HGBA1C 5.7 (H) 08/17/2016   MPG 117 08/17/2016   MPG 126 (H) 04/02/2012   No results found for: PROLACTIN Lab Results  Component Value Date   CHOL 330 (H) 04/02/2012   TRIG 244 (H) 04/02/2012   HDL 36 (L) 04/02/2012   CHOLHDL 9.2 04/02/2012   VLDL 49 (H) 04/02/2012   LDLCALC 245 (H) 04/02/2012   LDLCALC  08/25/2010    UNABLE TO CALCULATE IF TRIGLYCERIDE OVER 400 mg/dL        Total Cholesterol/HDL:CHD Risk Coronary Heart Disease Risk Table                     Men   Women  1/2 Average Risk   3.4   3.3  Average Risk       5.0   4.4  2 X Average Risk   9.6   7.1  3 X Average Risk  23.4   11.0        Use the calculated  Patient Ratio above and the CHD Risk Table to determine the patient's CHD Risk.        ATP III CLASSIFICATION (LDL):  <100     mg/dL   Optimal  100-129  mg/dL   Near or Above                    Optimal  130-159  mg/dL   Borderline  160-189  mg/dL   High  >190     mg/dL   Very High    Physical Findings: AIMS: Facial and Oral Movements Muscles of Facial Expression: None, normal Lips and Perioral Area: None, normal Jaw: None, normal Tongue: None, normal,Extremity Movements Upper (arms, wrists, hands, fingers): None, normal Lower (legs, knees, ankles, toes): None, normal, Trunk Movements Neck, shoulders, hips: None, normal, Overall Severity Severity of abnormal movements (highest score from questions above): None, normal Incapacitation due to abnormal movements: None, normal Patient's awareness of abnormal movements (rate only patient's report): No Awareness, Dental Status Current problems with teeth and/or dentures?: No Does patient usually wear dentures?: No  CIWA:  CIWA-Ar Total: 1 COWS:  COWS Total Score: 1  Musculoskeletal: Strength & Muscle Tone: within normal limits Gait & Station: normal Patient leans: N/A  Psychiatric Specialty Exam: Physical Exam  Nursing note and vitals reviewed. Neurological: Abnormal muscle tone: improved.    ROS feels like "too much medication"  Blood pressure 92/62, pulse 85, temperature 98.5 F (36.9 C), temperature source Oral, resp. rate 18, height 5' 7.5" (1.715 m), weight 81.6 kg (180 lb).Body mass index is 27.78 kg/m.  General Appearance: Disheveled, but better today  Eye Contact:  Poor but improving  Speech:  Slow but more speech today  Volume:  Decreased  Mood:  Anxious and Depressed but more talkative today  Affect:  Depressed  Thought Process:  Linear  Orientation:  Full (Time, Place, and Person)  Thought Content:  Rumination  Suicidal Thoughts:  No  Homicidal Thoughts:  No  Memory:  NA  Judgement:  Poor  Insight:  Lacking   Psychomotor Activity:  Increased  Concentration:  Concentration: Fair  Recall:  [place] of Knowledge:  Fair  Language:  Fair  Akathisia:  No  Handed:  Right  AIMS (if indicated):     Assets:  Social Support  ADL's:  Impaired  Cognition:  WNL  Sleep:  Number of Hours: 6.5     Treatment Plan Summary: Patient is seen and examined. On 2/21 patient was slightly improved from yesterday. He is speaking a bit more, but remains quite guarded. He did take the seroquel last night and slept better. We will increase his neurontin and seroquel. He has really only been on the zoloft for a couple of adys but I am going to increase it slightly to 75 mg po q day.  On 2/22 the patient is seen and examined. He is more awake and talkative. His hygiene remains poor. He talks about being overwhelmed by bills, stressors, "poor decisions" that he has made previously. 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 Denies SI and psychotic symptoms currently. Anxiety is improving. Continue with current medication regime.  On 2/23 he appears more talkative and slowly improving. Hi eye contact is better, he still has poor hygiene, still with significant depressive symptoms including being almost paralyzed with hopelessness in the future. He is concerned about "all the medications" and I pointed out that 3 of these were for his anxiety, and the other for mood and sleep and that he was improving. He has agreed to continue these medications. While slowly improving I believe the patient will require most likely another 7 days before he may able to function adequately in the community.   Sharma Covert, MD 08/20/2016, 11:33 AMPatient ID: Frank [name], male   DOB: [birth year], 66 y.o.   MRN: [id number] Patient ID: Frank [name], male   DOB: 1951-01-07, 66 y.o.   MRN: [id number] Patient ID: Frank [name], male   DOB: [birth year], 66 y.o.   MRN: [id number]

## 2016-08-20 NOTE — Progress Notes (Signed)
Recreation Therapy Notes  Date: 08/20/16 Time: 0930 Location: 300 Hall Dayroom  Group Topic: Stress Management  Goal Area(s) Addresses:  Patient will verbalize importance of using healthy stress management.  Patient will identify positive emotions associated with healthy stress management.   Intervention: Stress Management  Activity :  Focus Meditation.  LRT introduced the stress management technique of meditation.  LRT played a meditation on focus from the Calm app so patients could engage in the activity.  Patients were to follow along as the meditation was played to participate.  Education:  Stress Management, Discharge Planning.   Education Outcome: Acknowledges edcuation/In group clarification offered/Needs additional education  Clinical Observations/Feedback: Pt did not attend group.   Victorino Sparrow, LRT/CTRS         Victorino Sparrow A 08/20/2016 12:44 PM

## 2016-08-20 NOTE — BHH Group Notes (Signed)
[place] LCSW Group Therapy  08/20/2016 2:23 PM  Type of Therapy:  Group Therapy  Participation Level:  Did Not Attend-pt invited. Chose to remain in bed.   Summary of Progress/Problems: Feelings around Relapse. Group members discussed the meaning of relapse and shared personal stories of relapse, how it affected them and others, and how they perceived themselves during this time. Group members were encouraged to identify triggers, warning signs and coping skills used when facing the possibility of relapse. Social supports were discussed and explored in detail.   Syann Cupples N Smart LCSW 08/20/2016, 2:23 PM

## 2016-08-21 DIAGNOSIS — F419 Anxiety disorder, unspecified: Secondary | ICD-10-CM

## 2016-08-21 NOTE — Progress Notes (Signed)
Data. Patient denies SI/HI/AVH. Patient spent the day isolating in his room in bed, including meals. Missed am medications. Affect is flat and does not brighten. Eye contact is avoidant and minimal repsonses to questions. Patient's hygiene is poor. Patient did not complete a self assessment.  Action. Emotional support and encouragement offered. Education provided on medication, indications and side effect. Q 15 minute checks done for safety. Response. Safety on the unit maintained through 15 minute checks.  Medications taken as prescribed.

## 2016-08-21 NOTE — Progress Notes (Signed)
D   Pt is very guarded and paranoid regarding his medications    He is also moderately tremulous and shakey    He is irritable and argumentative     He has limited interaction and rarely initiates it  A    Verbal support given   Medications administered and effectiveness monitored   Q 15 min checks R    Pt is safe at present time

## 2016-08-21 NOTE — Progress Notes (Signed)
[facility] MD Progress Note  08/21/2016 1:36 PM Frank [name]  MRN:  [id number] Subjective:  Patient reports " I am  doing okay today, just still depressed."  Objective: Frank [name] is awake, alert and oriented *3. Seen resting in bedroom. Denies suicidal or homicidal ideation. Denies auditory or visual hallucination and does not appear to be responding to internal stimuli. Patient reports his depression 8/10.  Patient reports he is medication compliant without mediation side effects.  Reports good appetite and states he is resting well.  Support, encouragement and reassurance was provided.   Principal Problem: depression, anxiety and alcohol use disorder Diagnosis:   Patient Active Problem List   Diagnosis Date Noted  . MDD (major depressive disorder), recurrent severe, without psychosis ([place]) [F33.2] 08/15/2016  . MDD (major depressive disorder) [F32.9] 05/20/2012  . Cocaine abuse [F14.10] 05/20/2012    Class: Chronic  . Chronic pain associated with significant psychosocial dysfunction [G89.4] 04/01/2012  . Anxiety [F41.9]   . Depression [F32.9]   . Drug addiction ([place]) [F19.20]    Total Time spent with patient: 20 minutes  Past Psychiatric History: see H&P  Past Medical History:  Past Medical History:  Diagnosis Date  . Anxiety   . Depression   . Drug addiction ([place])    History reviewed. No pertinent surgical history. Family History: History reviewed. No pertinent family history. Family Psychiatric  History: Patient has been admitted to [facility] in 2013. Patient also admitted to an admission to [facility] at an unspecified date Social History:  History  Alcohol Use  . 0.6 oz/week  . 1 Cans of beer per week    Comment: PAIN PILLS--PERCOCET/VICODIN     History  Drug Use  . Types: Other-see comments, "Crack" cocaine, Cocaine    Comment: smoking crack for the past week    Social History   Social History  . Marital status: Divorced    Spouse name: N/A  . Number of children: N/A  .  Years of education: N/A   Social History Main Topics  . Smoking status: Never Smoker  . Smokeless tobacco: Never Used  . Alcohol use 0.6 oz/week    1 Cans of beer per week     Comment: PAIN PILLS--PERCOCET/VICODIN  . Drug use: Yes    Types: Other-see comments, "Crack" cocaine, Cocaine     Comment: smoking crack for the past week  . Sexual activity: Not Asked     Comment: oxycontin   Other Topics Concern  . None   Social History Narrative  . None   Additional Social History:   see H&P                      Sleep: improved  Appetite:  Fair  Current Medications: Current Facility-Administered Medications  Medication Dose Route Frequency Provider Last Rate Last Dose  . acetaminophen (TYLENOL) tablet 650 mg  650 mg Oral Q6H PRN Lurena Nida, NP      . busPIRone (BUSPAR) tablet 15 mg  15 mg Oral TID Sharma Covert, MD   15 mg at 08/21/16 1157  . gabapentin (NEURONTIN) capsule 400 mg  400 mg Oral TID Sharma Covert, MD   400 mg at 08/21/16 1157  . hydrOXYzine (ATARAX/VISTARIL) tablet 25 mg  25 mg Oral TID PRN Lurena Nida, NP   25 mg at 08/16/16 0904  . QUEtiapine (SEROQUEL) tablet 100 mg  100 mg Oral Daily Sharma Covert, MD   100 mg at  08/21/16 1157  . QUEtiapine (SEROQUEL) tablet 300 mg  300 mg Oral QHS Sharma Covert, MD   300 mg at 08/20/16 2222  . sertraline (ZOLOFT) tablet 75 mg  75 mg Oral Daily Sharma Covert, MD   75 mg at 08/21/16 1157  . traZODone (DESYREL) tablet 50 mg  50 mg Oral QHS PRN Lurena Nida, NP   50 mg at 08/20/16 2222    Lab Results:  No results found for this or any previous visit (from the past 48 hour(s)).  Blood Alcohol level:  Lab Results  Component Value Date   [facility] <5 08/14/2016   ETH <11 123XX123    Metabolic Disorder Labs: Lab Results  Component Value Date   HGBA1C 5.7 (H) 08/17/2016   MPG 117 08/17/2016   MPG 126 (H) 04/02/2012   No results found for: PROLACTIN Lab Results  Component Value Date   CHOL  330 (H) 04/02/2012   TRIG 244 (H) 04/02/2012   HDL 36 (L) 04/02/2012   CHOLHDL 9.2 04/02/2012   VLDL 49 (H) 04/02/2012   LDLCALC 245 (H) 04/02/2012   LDLCALC  08/25/2010    UNABLE TO CALCULATE IF TRIGLYCERIDE OVER 400 mg/dL        Total Cholesterol/HDL:CHD Risk Coronary Heart Disease Risk Table                     Men   Women  1/2 Average Risk   3.4   3.3  Average Risk       5.0   4.4  2 X Average Risk   9.6   7.1  3 X Average Risk  23.4   11.0        Use the calculated Patient Ratio above and the CHD Risk Table to determine the patient's CHD Risk.        ATP III CLASSIFICATION (LDL):  <100     mg/dL   Optimal  100-129  mg/dL   Near or Above                    Optimal  130-159  mg/dL   Borderline  160-189  mg/dL   High  >190     mg/dL   Very High    Physical Findings: AIMS: Facial and Oral Movements Muscles of Facial Expression: None, normal Lips and Perioral Area: None, normal Jaw: None, normal Tongue: None, normal,Extremity Movements Upper (arms, wrists, hands, fingers): None, normal Lower (legs, knees, ankles, toes): None, normal, Trunk Movements Neck, shoulders, hips: None, normal, Overall Severity Severity of abnormal movements (highest score from questions above): None, normal Incapacitation due to abnormal movements: None, normal Patient's awareness of abnormal movements (rate only patient's report): No Awareness, Dental Status Current problems with teeth and/or dentures?: No Does patient usually wear dentures?: No  CIWA:  CIWA-Ar Total: 4 COWS:  COWS Total Score: 1  Musculoskeletal: Strength & Muscle Tone: within normal limits Gait & Station: normal Patient leans: N/A  Psychiatric Specialty Exam: Physical Exam  Nursing note and vitals reviewed. Constitutional: He is oriented to person, place, and time. He appears well-developed.  Cardiovascular: Normal rate.   Neurological: He is alert and oriented to person, place, and time. Abnormal muscle tone:  improved.  Psychiatric: He has a normal mood and affect. His behavior is normal.    Review of Systems  Psychiatric/Behavioral: Positive for depression. The patient is nervous/anxious and has insomnia.    feels like "too much medication"  Blood  pressure 123/88, pulse 86, temperature 98.6 F (37 C), resp. rate 18, height 5' 7.5" (1.715 m), weight 81.6 kg (180 lb).Body mass index is 27.78 kg/m.  General Appearance: Casual and Guarded  Eye Contact:  Fair   Speech:  Clear and Coherent  Volume:  Decreased  Mood:  Anxious and Depressed   Affect:  Depressed and Flat  Thought Process:  Linear  Orientation:  Full (Time, Place, and Person)  Thought Content:  Rumination  Suicidal Thoughts:  No  Homicidal Thoughts:  No  Memory:  NA  Judgement:  Fair  Insight:  Lacking  Psychomotor Activity:  Normal  Concentration:  Concentration: Fair  Recall:  [employer] of Knowledge:  Fair  Language:  Fair  Akathisia:  No  Handed:  Right  AIMS (if indicated):     Assets:  Social Support  ADL's:  Intact  Cognition:  WNL  Sleep:  Number of Hours: 5.5    I agree with current treatment plan on 08/21/2016, Patient seen face-to-face for psychiatric evaluation follow-up, chart reviewed and case discussed. Reviewed the information documented and agree with the treatment plan.  Treatment Plan Summary: Daily contact with patient to assess and evaluate symptoms and progress in treatment and Medication management  Continue with  Zoloft 75 mg Neurontin 400 mg and Buspar 15 mg, Seroquel 100 mg and 300 mg PO QHS for mood stabilization.  Will continue to monitor vitals ,medication compliance and treatment side effects while patient is here.  Reviewed labs A1c 5.7 elevated ,BAL -, UDS -  CSW will start working on disposition.  Patient to participate in therapeutic milieu  Derrill [place], NP 08/21/2016, 1:36 PM

## 2016-08-21 NOTE — Progress Notes (Signed)
D   Pt is very guarded and paranoid regarding his medications he looked and looked at his medicine as if something was wrong with it but finally did take the pills    He is also moderately tremulous and shakey    He is less irritable and argumentative     He has limited interaction and rarely initiates it  A    Verbal support given   Medications administered and effectiveness monitored   Q 15 min checks R    Pt is safe at present time

## 2016-08-21 NOTE — BHH Group Notes (Signed)
[place] LCSW Group Therapy Note  08/21/2016 at 10:00 AM  Type of Therapy and Topic:  Group Therapy: Avoiding Self-Sabotaging and Enabling Behaviors  Participation Level:  Did Not Attend; invited to participate yet did not despite overhead announcement and encouragement by staff   Summary of Group Progress: The main focus of today's process group was to explain to the adolescent what "self-sabotage" means and use Motivational Interviewing to discuss what benefits, negative or positive, were involved in a self-identified self-sabotaging behavior. We then talked about reasons the patient may want to change the behavior and their current desire to change.  Sheilah Pigeon, LCSW

## 2016-08-22 MED ORDER — SERTRALINE HCL 100 MG PO TABS
100.0000 mg | ORAL_TABLET | Freq: Every day | ORAL | Status: DC
Start: 2016-08-23 — End: 2016-08-24
  Administered 2016-08-23 – 2016-08-24 (×2): 100 mg via ORAL
  Filled 2016-08-22 (×4): qty 1

## 2016-08-22 NOTE — BHH Group Notes (Signed)
[place] LCSW Group Therapy  08/22/2016 10:00 AM  Type of Therapy:  Group Therapy with activity  Participation Level:  Did Not Attend; invited to participate yet did not despite overhead announcement and encouragement by staff   Summary of Progress/Problems: Topic for today was thoughts and feelings regarding discharge. We discussed fears of upcoming changes including judgements, expectations and stigma of mental health issues. We then discussed supports: what constitutes a supportive framework, identification of supports and what to do when others are not supportive. Activity was used to engage patients in visualizing the future based on today's choices.   Sheilah Pigeon, LCSW

## 2016-08-22 NOTE — BHH Group Notes (Signed)
[place] Group Notes:  (Nursing/MHT/Case Management/Adjunct)  Date:  08/22/2016  Time:  2:12 PM  Type of Therapy:  Psychoeducational Skills  Participation Level:  Did Not Attend  Participation Quality:  Did Not Attend  Affect:  Did Not Attend  Cognitive:  Did Not Attend  Insight:  None  Engagement in Group:  Did Not Attend  Modes of Intervention:  Did Not Attend  Summary of Progress/Problems: Pt did not attend patient self inventory group.   Benancio Deeds Shanta 08/22/2016, 2:12 PM

## 2016-08-22 NOTE — Progress Notes (Addendum)
Received lengthy TC from patient's daughter in [place], Frank [name] [phone]). Daughter does not have patient's code number and this writer did not confirm or deny that the patient is currently admitted. Daughter talked at length about her father's unwillingness to share information and the family's concern that patient is emotionally unstable and a danger to himself. She states family is worried he will "manipulate himself out of the hospital and then disappear." Will approach patient about sharing his code with family.  Add note: Spoke with patient who states Frank [name] is not his daughter but is a family friend. Continues to refuse to disclose code.

## 2016-08-22 NOTE — Progress Notes (Signed)
Patient is isolative to room, bed though does not appear to be sleeping. Remains disheveled in appearance with poor hygiene. Affect flat, sullen and sad with congruent mood. Gives minimal eye contact and is guarded 1:1. Irritable at times. Inspects medications before taking them.   Patient offered emotional support and strongly encouraged to participate in programming or at least come out of room into dayroom. Asked to complete self inventory and suicide safety plan though patient resistant. Medicated per orders and education given. Fall precautions in place and reviewed.  Patient verbalizes understanding of POC, fall p/c. He denies SI/HI and remains safe.

## 2016-08-22 NOTE — Progress Notes (Signed)
[facility] MD Progress Note  08/22/2016 12:06 PM Frank [name]  MRN:  [id number] Subjective:  Patient reports " I am feeling better no that I was able to get some rest." -room changed due to peer snoring  Objective: Frank [name] is awake, alert and oriented *3. Seen resting in bedroom.  Patient reports he is still depressed and just want to be alone. Rate his depression 10/10.  Denies suicidal or homicidal ideation. Denies auditory or visual hallucination and does not appear to be responding to internal stimuli. Patient reports he is medication compliant without mediation side effects. Patient was encouraged to attend group session. Support, encouragement and reassurance was provided.   Principal Problem: depression, anxiety and alcohol use disorder Diagnosis:   Patient Active Problem List   Diagnosis Date Noted  . MDD (major depressive disorder), recurrent severe, without psychosis ([place]) [F33.2] 08/15/2016  . MDD (major depressive disorder) [F32.9] 05/20/2012  . Cocaine abuse [F14.10] 05/20/2012    Class: Chronic  . Chronic pain associated with significant psychosocial dysfunction [G89.4] 04/01/2012  . Anxiety [F41.9]   . Depression [F32.9]   . Drug addiction ([place]) [F19.20]    Total Time spent with patient: 20 minutes  Past Psychiatric History: see H&P  Past Medical History:  Past Medical History:  Diagnosis Date  . Anxiety   . Depression   . Drug addiction ([place])    History reviewed. No pertinent surgical history. Family History: History reviewed. No pertinent family history. Family Psychiatric  History: Patient has been admitted to [facility] in 2013. Patient also admitted to an admission to [facility] at an unspecified date Social History:  History  Alcohol Use  . 0.6 oz/week  . 1 Cans of beer per week    Comment: PAIN PILLS--PERCOCET/VICODIN     History  Drug Use  . Types: Other-see comments, "Crack" cocaine, Cocaine    Comment: smoking crack for the past week    Social History    Social History  . Marital status: Divorced    Spouse name: N/A  . Number of children: N/A  . Years of education: N/A   Social History Main Topics  . Smoking status: Never Smoker  . Smokeless tobacco: Never Used  . Alcohol use 0.6 oz/week    1 Cans of beer per week     Comment: PAIN PILLS--PERCOCET/VICODIN  . Drug use: Yes    Types: Other-see comments, "Crack" cocaine, Cocaine     Comment: smoking crack for the past week  . Sexual activity: Not Asked     Comment: oxycontin   Other Topics Concern  . None   Social History Narrative  . None   Additional Social History:   see H&P                      Sleep: improved  Appetite:  Fair  Current Medications: Current Facility-Administered Medications  Medication Dose Route Frequency Provider Last Rate Last Dose  . acetaminophen (TYLENOL) tablet 650 mg  650 mg Oral Q6H PRN Lurena Nida, NP      . busPIRone (BUSPAR) tablet 15 mg  15 mg Oral TID Sharma Covert, MD   15 mg at 08/22/16 1143  . gabapentin (NEURONTIN) capsule 400 mg  400 mg Oral TID Sharma Covert, MD   400 mg at 08/22/16 1143  . hydrOXYzine (ATARAX/VISTARIL) tablet 25 mg  25 mg Oral TID PRN Lurena Nida, NP   25 mg at 08/16/16 0904  . QUEtiapine (  SEROQUEL) tablet 100 mg  100 mg Oral Daily Sharma Covert, MD   100 mg at 08/22/16 0754  . QUEtiapine (SEROQUEL) tablet 300 mg  300 mg Oral QHS Sharma Covert, MD   300 mg at 08/21/16 2207  . sertraline (ZOLOFT) tablet 75 mg  75 mg Oral Daily Sharma Covert, MD   75 mg at 08/22/16 [id number]  . traZODone (DESYREL) tablet 50 mg  50 mg Oral QHS PRN Lurena Nida, NP   50 mg at 08/21/16 2207    Lab Results:  No results found for this or any previous visit (from the past 48 hour(s)).  Blood Alcohol level:  Lab Results  Component Value Date   [facility] <5 08/14/2016   ETH <11 123XX123    Metabolic Disorder Labs: Lab Results  Component Value Date   HGBA1C 5.7 (H) 08/17/2016   MPG 117 08/17/2016   MPG  126 (H) 04/02/2012   No results found for: PROLACTIN Lab Results  Component Value Date   CHOL 330 (H) 04/02/2012   TRIG 244 (H) 04/02/2012   HDL 36 (L) 04/02/2012   CHOLHDL 9.2 04/02/2012   VLDL 49 (H) 04/02/2012   LDLCALC 245 (H) 04/02/2012   LDLCALC  08/25/2010    UNABLE TO CALCULATE IF TRIGLYCERIDE OVER 400 mg/dL        Total Cholesterol/HDL:CHD Risk Coronary Heart Disease Risk Table                     Men   Women  1/2 Average Risk   3.4   3.3  Average Risk       5.0   4.4  2 X Average Risk   9.6   7.1  3 X Average Risk  23.4   11.0        Use the calculated Patient Ratio above and the CHD Risk Table to determine the patient's CHD Risk.        ATP III CLASSIFICATION (LDL):  <100     mg/dL   Optimal  100-129  mg/dL   Near or Above                    Optimal  130-159  mg/dL   Borderline  160-189  mg/dL   High  >190     mg/dL   Very High    Physical Findings: AIMS: Facial and Oral Movements Muscles of Facial Expression: None, normal Lips and Perioral Area: None, normal Jaw: None, normal Tongue: None, normal,Extremity Movements Upper (arms, wrists, hands, fingers): None, normal Lower (legs, knees, ankles, toes): None, normal, Trunk Movements Neck, shoulders, hips: None, normal, Overall Severity Severity of abnormal movements (highest score from questions above): None, normal Incapacitation due to abnormal movements: None, normal Patient's awareness of abnormal movements (rate only patient's report): No Awareness, Dental Status Current problems with teeth and/or dentures?: No Does patient usually wear dentures?: No  CIWA:  CIWA-Ar Total: 4 COWS:  COWS Total Score: 1  Musculoskeletal: Strength & Muscle Tone: within normal limits Gait & Station: normal Patient leans: N/A  Psychiatric Specialty Exam: Physical Exam  Nursing note and vitals reviewed. Constitutional: He is oriented to person, place, and time. He appears well-developed.  Cardiovascular: Normal  rate.   Neurological: He is alert and oriented to person, place, and time. Abnormal muscle tone: improved.  Psychiatric: He has a normal mood and affect. His behavior is normal.    Review of Systems  Psychiatric/Behavioral: Positive for  depression. The patient is nervous/anxious and has insomnia.    feels like "too much medication"  Blood pressure (!) 82/56, pulse 76, temperature 97.5 F (36.4 C), resp. rate 18, height 5' 7.5" (1.715 m), weight 81.6 kg (180 lb).Body mass index is 27.78 kg/m.  General Appearance: Guarded  Eye Contact:  Fair   Speech:  Clear and Coherent  Volume:  Decreased  Mood:  Depressed   Affect:  Depressed and Flat  Thought Process:  Linear  Orientation:  Full (Time, Place, and Person)  Thought Content:  Rumination  Suicidal Thoughts:  No  Homicidal Thoughts:  No  Memory:  NA  Judgement:  Fair  Insight:  Fair  Psychomotor Activity:  Normal  Concentration:  Concentration: Fair  Recall:  Frank [name] of Knowledge:  Fair  Language:  Good  Akathisia:  No  Handed:  Right  AIMS (if indicated):     Assets:  Social Support  ADL's:  Intact  Cognition:  WNL  Sleep:  Number of Hours: 5.5    I agree with current treatment plan on 08/22/2016, Patient seen face-to-face for psychiatric evaluation follow-up, chart reviewed and case discussed. Reviewed the information documented and agree with the treatment plan.  Treatment Plan Summary: Daily contact with patient to assess and evaluate symptoms and progress in treatment and Medication management   Continue  Treatment plan as listed on 08/22/2016 except where noted  Increased Zoloft 75 mg to 100 mg and continue with Neurontin 400 mg and Buspar 15 mg, Seroquel 100 mg and 300 mg PO QHS for mood stabilization.  Will continue to monitor vitals ,medication compliance and treatment side effects while patient is here.  Reviewed labs A1c 5.7 elevated ,BAL -, UDS -  CSW will start working on disposition.  Patient to  participate in therapeutic milieu  Derrill Center, NP 08/22/2016, 12:06 PM

## 2016-08-22 NOTE — Plan of Care (Signed)
Problem: Activity: Goal: Interest or engagement in activities will improve Outcome: Not Progressing Patient remains isolative to bed, is not participative in programming.  Problem: Safety: Goal: Periods of time without injury will increase Outcome: Progressing Patient has not engaged in self harm, denies  SI at this time.

## 2016-08-22 NOTE — Progress Notes (Signed)
D   Pt is very guarded and paranoid regarding his medications he looked and looked at his medicine as if something was wrong with it but finally did take the pills  He did not seem as suspicious as he was yesterday and has been more social and his appearance has improved   He is also less tremulous and shakey    He is less irritable and argumentative     He has limited interaction and rarely initiates it  A    Verbal support given   Medications administered and effectiveness monitored   Q 15 min checks R    Pt is safe at present time

## 2016-08-22 NOTE — Plan of Care (Signed)
Problem: Self-Concept: Goal: Level of anxiety will decrease Outcome: Progressing Pt appears less anxious this evening   He was less shakey and guarded  Problem: Coping: Goal: Ability to interact with others will improve Outcome: Progressing Pt has spent more time in the dayroom around others this evening

## 2016-08-23 NOTE — Progress Notes (Signed)
D: Patient up and visible in the milieu. Spoke with patient 1:1. Patient's affect flat, depressed and sad with congruent mood. Continues to state, "I'm fine" and gives minimal information regarding status. Continuesto keep to self, frequently isolative to room, bed though states he is sleeping at night.  Denies pain, physical problems.   A: Medicated per orders, no prns requested, required. Emotional support offered. Asked patient to complete self inventory. Encouraged completion of Suicide Safety Plan. Discussed POC with MD, SW.  Fall precautions in place and reviewed.   R: Patient verbalizes understanding of POC, fall p/c. Patient denies SI/HI and remains safe on level III obs. Will continue to encourage milieu participation.

## 2016-08-23 NOTE — Plan of Care (Signed)
Problem: Safety: Goal: Ability to identify and utilize support systems that promote safety will improve Outcome: Not Progressing Patient continues to state he does not want family involvement going forward. States he does not need support.  Problem: Coping: Goal: Ability to use eye contact when communicating with others will improve Outcome: Not Progressing Patient with minimal eye contact during 1:1 interaction.

## 2016-08-23 NOTE — Progress Notes (Signed)
Recreation Therapy Notes  Date: 08/23/16 Time: 0930 Location: 300 Hall Dayroom  Group Topic: Stress Management  Goal Area(s) Addresses:  Patient will verbalize importance of using healthy stress management.  Patient will identify positive emotions associated with healthy stress management.   Intervention: Stress Management  Activity :  [facility].  LRT introduced the stress management technique of guided imagery.  LRT read a script to allow patients to engage in the activity.  Patients were to follow along as LRT read script to participate in activity.  Education:  Stress Management, Discharge Planning.   Education Outcome: Acknowledges edcuation/In group clarification offered/Needs additional education  Clinical Observations/Feedback: Pt did not attend group.    Victorino Sparrow, LRT/CTRS         Ria Comment, Isacc Turney A 08/23/2016 1:22 PM

## 2016-08-23 NOTE — Progress Notes (Signed)
The patient attended a few minutes of the meeting and then returned to his bedroom

## 2016-08-23 NOTE — Progress Notes (Signed)
[facility] MD Progress Note  08/23/2016 4:15 PM Frank [name]  MRN:  [id number]  Subjective: Frank [name] reports, "I'm not feeling good. I still have some sadness in me"  Objective: Frank [name] is seen, chart reviewed. He is awake, alert and oriented  X 3. He is observed lying down in his bed. He says he did not go to break & or lunch because he is not feeling hungry. He says he will likely go to dinner in the evening. He continues to report some sadness. However, denies suicidal or homicidal ideations. Denies auditory or visual hallucination and does not appear to be responding to internal stimuli. Patient reports he is medication compliant without mediation side effects. Patient was encouraged to attend group session. Support, encouragement and reassurance was provided.   Principal Problem: depression, anxiety and alcohol use disorder Diagnosis:   Patient Active Problem List   Diagnosis Date Noted  . MDD (major depressive disorder), recurrent severe, without psychosis ([place]) [F33.2] 08/15/2016  . MDD (major depressive disorder) [F32.9] 05/20/2012  . Cocaine abuse [F14.10] 05/20/2012    Class: Chronic  . Chronic pain associated with significant psychosocial dysfunction [G89.4] 04/01/2012  . Anxiety [F41.9]   . Depression [F32.9]   . Drug addiction ([place]) [F19.20]    Total Time spent with patient: 15 minutes  Past Psychiatric History: See H&P  Past Medical History:  Past Medical History:  Diagnosis Date  . Anxiety   . Depression   . Drug addiction ([place])    History reviewed. No pertinent surgical history. Family History: History reviewed. No pertinent family history.  Family Psychiatric  History: Patient has been admitted to [facility] in 2013. Patient also admitted to an admission to [facility] at an unspecified date  Social History:  History  Alcohol Use  . 0.6 oz/week  . 1 Cans of beer per week    Comment: PAIN PILLS--PERCOCET/VICODIN     History  Drug Use  . Types: Other-see comments, "Crack" cocaine,  Cocaine    Comment: smoking crack for the past week    Social History   Social History  . Marital status: Divorced    Spouse name: N/A  . Number of children: N/A  . Years of education: N/A   Social History Main Topics  . Smoking status: Never Smoker  . Smokeless tobacco: Never Used  . Alcohol use 0.6 oz/week    1 Cans of beer per week     Comment: PAIN PILLS--PERCOCET/VICODIN  . Drug use: Yes    Types: Other-see comments, "Crack" cocaine, Cocaine     Comment: smoking crack for the past week  . Sexual activity: Not Asked     Comment: oxycontin   Other Topics Concern  . None   Social History Narrative  . None   Additional Social History:   Sleep: "Improving"  Appetite:  Fair  Current Medications: Current Facility-Administered Medications  Medication Dose Route Frequency Provider Last Rate Last Dose  . acetaminophen (TYLENOL) tablet 650 mg  650 mg Oral Q6H PRN Lurena Nida, NP      . busPIRone (BUSPAR) tablet 15 mg  15 mg Oral TID Sharma Covert, MD   15 mg at 08/23/16 1159  . gabapentin (NEURONTIN) capsule 400 mg  400 mg Oral TID Sharma Covert, MD   400 mg at 08/23/16 1159  . hydrOXYzine (ATARAX/VISTARIL) tablet 25 mg  25 mg Oral TID PRN Lurena Nida, NP   25 mg at 08/16/16 0904  . QUEtiapine (SEROQUEL) tablet 100 mg  100 mg Oral Daily Sharma Covert, MD   100 mg at 08/23/16 0759  . QUEtiapine (SEROQUEL) tablet 300 mg  300 mg Oral QHS Sharma Covert, MD   300 mg at 08/22/16 2152  . sertraline (ZOLOFT) tablet 100 mg  100 mg Oral Daily Derrill Center, NP   100 mg at 08/23/16 0758  . traZODone (DESYREL) tablet 50 mg  50 mg Oral QHS PRN Lurena Nida, NP   50 mg at 08/22/16 2152   Lab Results:  No results found for this or any previous visit (from the past 48 hour(s)).  Blood Alcohol level:  Lab Results  Component Value Date   [facility] <5 08/14/2016   ETH <11 123XX123   Metabolic Disorder Labs: Lab Results  Component Value Date   HGBA1C 5.7 (H)  08/17/2016   MPG 117 08/17/2016   MPG 126 (H) 04/02/2012   No results found for: PROLACTIN Lab Results  Component Value Date   CHOL 330 (H) 04/02/2012   TRIG 244 (H) 04/02/2012   HDL 36 (L) 04/02/2012   CHOLHDL 9.2 04/02/2012   VLDL 49 (H) 04/02/2012   LDLCALC 245 (H) 04/02/2012   LDLCALC  08/25/2010    UNABLE TO CALCULATE IF TRIGLYCERIDE OVER 400 mg/dL        Total Cholesterol/HDL:CHD Risk Coronary Heart Disease Risk Table                     Men   Women  1/2 Average Risk   3.4   3.3  Average Risk       5.0   4.4  2 X Average Risk   9.6   7.1  3 X Average Risk  23.4   11.0        Use the calculated Patient Ratio above and the CHD Risk Table to determine the patient's CHD Risk.        ATP III CLASSIFICATION (LDL):  <100     mg/dL   Optimal  100-129  mg/dL   Near or Above                    Optimal  130-159  mg/dL   Borderline  160-189  mg/dL   High  >190     mg/dL   Very High    Physical Findings: AIMS: Facial and Oral Movements Muscles of Facial Expression: None, normal Lips and Perioral Area: None, normal Jaw: None, normal Tongue: None, normal,Extremity Movements Upper (arms, wrists, hands, fingers): None, normal Lower (legs, knees, ankles, toes): None, normal, Trunk Movements Neck, shoulders, hips: None, normal, Overall Severity Severity of abnormal movements (highest score from questions above): None, normal Incapacitation due to abnormal movements: None, normal Patient's awareness of abnormal movements (rate only patient's report): No Awareness, Dental Status Current problems with teeth and/or dentures?: No Does patient usually wear dentures?: No  CIWA:  CIWA-Ar Total: 4 COWS:  COWS Total Score: 1  Musculoskeletal: Strength & Muscle Tone: within normal limits Gait & Station: normal Patient leans: N/A  Psychiatric Specialty Exam: Physical Exam  Nursing note and vitals reviewed. Constitutional: He is oriented to person, place, and time. He appears  well-developed.  Cardiovascular: Normal rate.   Neurological: He is alert and oriented to person, place, and time. Abnormal muscle tone: improved.  Psychiatric: He has a normal mood and affect. His behavior is normal.    Review of Systems  Psychiatric/Behavioral: Positive for depression. The patient is nervous/anxious and has  insomnia.    feels like "too much medication"  Blood pressure 103/68, pulse 83, temperature 97.9 F (36.6 C), temperature source Oral, resp. rate 16, height 5' 7.5" (1.715 m), weight 81.6 kg (180 lb).Body mass index is 27.78 kg/m.  General Appearance: Guarded  Eye Contact:  Fair   Speech:  Clear and Coherent  Volume:  Decreased  Mood:  "Improving, still feeling some sadness"   Affect:  Flat  Thought Process:  Linear  Orientation:  Full (Time, Place, and Person)  Thought Content:  Rumination  Suicidal Thoughts:  Denies  Homicidal Thoughts:  Denies  Memory:  Immediate;   Good Recent;   Good Remote;   Good  Judgement:  Fair  Insight:  Fair  Psychomotor Activity:  Decreased  Concentration:  Concentration: Fair and Attention Span: Fair  Recall:  [employer] of Knowledge:  Fair  Language:  Good  Akathisia:  No  Handed:  Right  AIMS (if indicated):     Assets:  Desire for Improvement Social Support  ADL's:  Intact  Cognition:  WNL  Sleep:  Number of Hours: 5.5   Treatment Plan Summary: Daily contact with patient to assess and evaluate symptoms and progress in treatment and Medication management   Continue Treatment plan as listed on 08/23/2016 except where noted  Will continue Zoloft 100 mg for depression. Will continue Neurontin 400 mg for agitation. Will continue Buspar 15 mg for anxiety. Will continue Seroquel 300 mg PO QHS for mood stabilization.  Will continue to monitor vitals, medication compliance and treatment side effects while patient is here.  Reviewed labs A1c 5.7 elevated, BAL -, UDS -  CSW is working on disposition.  Patient to  participate in therapeutic milieu   Encarnacion Slates, NP, PMHNP, FNP-BC 08/23/2016, 4:15 PM Patient ID: Frank [name], male   DOB: 1950-09-22, 66 y.o.   MRN: [id number]

## 2016-08-23 NOTE — [facility] Group Notes (Signed)
[place] LCSW Group Therapy  08/23/2016 2:35 PM  Type of Therapy:  Group Therapy  Participation Level:  Did Not Attend-pt invited. Chose to remain in bed.   Summary of Progress/Problems: Today's Topic: Overcoming Obstacles. Patients identified one short term goal and potential obstacles in reaching this goal. Patients processed barriers involved in overcoming these obstacles. Patients identified steps necessary for overcoming these obstacles and explored motivation (internal and external) for facing these difficulties head on.   Sadee Osland N Smart LCSW 08/23/2016, 2:35 PM

## 2016-08-24 MED ORDER — MIRTAZAPINE 15 MG PO TABS
15.0000 mg | ORAL_TABLET | Freq: Every day | ORAL | Status: DC
  Administered 2016-08-24 – 2016-08-25 (×2): 15 mg via ORAL
  Filled 2016-08-24 (×4): qty 1

## 2016-08-24 MED ORDER — SERTRALINE HCL 50 MG PO TABS
150.0000 mg | ORAL_TABLET | Freq: Every day | ORAL | Status: DC
  Administered 2016-08-25 – 2016-08-27 (×3): 150 mg via ORAL
  Filled 2016-08-24 (×5): qty 1

## 2016-08-24 NOTE — Progress Notes (Signed)
[facility] MD Progress Note  08/24/2016 11:45 AM Frank [name]  MRN:  [id number] Subjective:   66 yo Caucasian male, single, lives with a roommate, unemployed. Long history of depression. Presented to the ER in company of his brother and girlfriend. Family concerned about him. Reported to have been feeling very down in the past couple of weeks. Not taking care of himself. Withdrawn and stays in his room all the time. Not eating and not sleeping. Asked his brother to shoot him. Reports a lot of financial stressors. Has been coping poorly and resorted to alcohol. BAL, UDS and routine labs were essentially normal at presentation.   Nursing staff reports that he isolates self in his room. He reports negative thoughts but would not elaborate any further. He does not participate with unit groups and activities. He is not grooming self.  At interview, patient reports being very down. Rates his depression as 7/10. Says he was not sleeping at home but has been getting some sleep since being on Seroquel. He has no joy in anything. No longer enjoys company of his girlfriend. Feels drained all the time. Very little appetite. Very worried about his stressors. Says he feels powerless. He is in th process of losing his car. Says that would take away his freedom of movement. He has unpaid taxes that would be due soon. 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 Business has been very slow and has not been able to function lately. Says he does not think he is welcomed back in the house he used to share. Patient is preoccupied with all the negative things in his life. Feels there is no way out. Denies any current thoughts of suicide. Says he has not moved is bowel in days. He is worried about his bodily functions. No perceptual abnormalities. No paranoia. Patient states that has never been a drinker. Coped with alcohol. No craving.   Principal Problem:   MDD severe without psychotic features. Diagnosis:   Patient Active Problem  List   Diagnosis Date Noted  . MDD (major depressive disorder), recurrent severe, without psychosis ([place]) [F33.2] 08/15/2016  . MDD (major depressive disorder) [F32.9] 05/20/2012  . Cocaine abuse [F14.10] 05/20/2012    Class: Chronic  . Chronic pain associated with significant psychosocial dysfunction [G89.4] 04/01/2012  . Anxiety [F41.9]   . Depression [F32.9]   . Drug addiction ([place]) [F19.20]    Total Time spent with patient: 30 minutes  Past Psychiatric History: As in H&P   Past Medical History:  Past Medical History:  Diagnosis Date  . Anxiety   . Depression   . Drug addiction ([place])    History reviewed. No pertinent surgical history. Family History: History reviewed. No pertinent family history. Family Psychiatric  History: As in H&P  Social History:  History  Alcohol Use  . 0.6 oz/week  . 1 Cans of beer per week    Comment: PAIN PILLS--PERCOCET/VICODIN     History  Drug Use  . Types: Other-see comments, "Crack" cocaine, Cocaine    Comment: smoking crack for the past week    Social History   Social History  . Marital status: Divorced    Spouse name: N/A  . Number of children: N/A  . Years of education: N/A   Social History Main Topics  . Smoking status: Never Smoker  . Smokeless tobacco: Never Used  . Alcohol use 0.6 oz/week    1 Cans of beer per week     Comment: PAIN PILLS--PERCOCET/VICODIN  .  Drug use: Yes    Types: Other-see comments, "Crack" cocaine, Cocaine     Comment: smoking crack for the past week  . Sexual activity: Not Asked     Comment: oxycontin   Other Topics Concern  . None   Social History Narrative  . None   Additional Social History:   Sleep: Good  Appetite:  Poor  Current Medications: Current Facility-Administered Medications  Medication Dose Route Frequency Provider Last Rate Last Dose  . acetaminophen (TYLENOL) tablet 650 mg  650 mg Oral Q6H PRN Lurena Nida, NP      . busPIRone (BUSPAR) tablet 15 mg  15 mg Oral TID  Sharma Covert, MD   15 mg at 08/24/16 0753  . gabapentin (NEURONTIN) capsule 400 mg  400 mg Oral TID Sharma Covert, MD   400 mg at 08/24/16 0753  . hydrOXYzine (ATARAX/VISTARIL) tablet 25 mg  25 mg Oral TID PRN Lurena Nida, NP   25 mg at 08/16/16 0904  . QUEtiapine (SEROQUEL) tablet 100 mg  100 mg Oral Daily Sharma Covert, MD   100 mg at 08/24/16 0753  . QUEtiapine (SEROQUEL) tablet 300 mg  300 mg Oral QHS Sharma Covert, MD   300 mg at 08/23/16 2144  . sertraline (ZOLOFT) tablet 100 mg  100 mg Oral Daily Derrill Center, NP   100 mg at 08/24/16 0753  . traZODone (DESYREL) tablet 50 mg  50 mg Oral QHS PRN Lurena Nida, NP   50 mg at 08/23/16 2144    Lab Results: No results found for this or any previous visit (from the past 48 hour(s)).  Blood Alcohol level:  Lab Results  Component Value Date   [facility] <5 08/14/2016   ETH <11 123XX123    Metabolic Disorder Labs: Lab Results  Component Value Date   HGBA1C 5.7 (H) 08/17/2016   MPG 117 08/17/2016   MPG 126 (H) 04/02/2012   No results found for: PROLACTIN Lab Results  Component Value Date   CHOL 330 (H) 04/02/2012   TRIG 244 (H) 04/02/2012   HDL 36 (L) 04/02/2012   CHOLHDL 9.2 04/02/2012   VLDL 49 (H) 04/02/2012   LDLCALC 245 (H) 04/02/2012   LDLCALC  08/25/2010    UNABLE TO CALCULATE IF TRIGLYCERIDE OVER 400 mg/dL        Total Cholesterol/HDL:CHD Risk Coronary Heart Disease Risk Table                     Men   Women  1/2 Average Risk   3.4   3.3  Average Risk       5.0   4.4  2 X Average Risk   9.6   7.1  3 X Average Risk  23.4   11.0        Use the calculated Patient Ratio above and the CHD Risk Table to determine the patient's CHD Risk.        ATP III CLASSIFICATION (LDL):  <100     mg/dL   Optimal  100-129  mg/dL   Near or Above                    Optimal  130-159  mg/dL   Borderline  160-189  mg/dL   High  >190     mg/dL   Very High    Physical Findings: AIMS: Facial and Oral  Movements Muscles of Facial Expression: None, normal Lips and Perioral  Area: None, normal Jaw: None, normal Tongue: None, normal,Extremity Movements Upper (arms, wrists, hands, fingers): None, normal Lower (legs, knees, ankles, toes): None, normal, Trunk Movements Neck, shoulders, hips: None, normal, Overall Severity Severity of abnormal movements (highest score from questions above): None, normal Incapacitation due to abnormal movements: None, normal Patient's awareness of abnormal movements (rate only patient's report): No Awareness, Dental Status Current problems with teeth and/or dentures?: No Does patient usually wear dentures?: No  CIWA:  CIWA-Ar Total: 4 COWS:  COWS Total Score: 1  Musculoskeletal: Strength & Muscle Tone: within normal limits Gait & Station: normal Patient leans: N/A  Psychiatric Specialty Exam: Physical Exam  Constitutional: He is oriented to person, place, and time. He appears well-developed.  HENT:  Head: Normocephalic.  Eyes: Pupils are equal, round, and reactive to light.  Neck: Normal range of motion. Neck supple.  Cardiovascular: Normal rate and regular rhythm.   Respiratory: Effort normal and breath sounds normal.  GI: Soft. Bowel sounds are normal.  Musculoskeletal: Normal range of motion.  Neurological: He is alert and oriented to person, place, and time.  Skin: Skin is warm and dry.  Psychiatric:  As above    ROS  Blood pressure 104/66, pulse 78, temperature 98.3 F (36.8 C), temperature source Oral, resp. rate 16, height 5' 7.5" (1.715 m), weight 81.6 kg (180 lb).Body mass index is 27.78 kg/m.  General Appearance: Disheveled and malodorous. Tense look. minimal eye contact. Psychomotor retardation. Not internally distracted. No EPS  Eye Contact:  Minimal  Speech:  Soft  Volume:  Decreased  Mood:  Depressed and Hopeless  Affect:  Blunt and Congruent  Thought Process:  Goal Directed  Orientation:  Full (Time, Place, and Person)   Thought Content:   No preoccupation with violent thoughts. Negative ruminations about the future. Somatic preocuupation.  No hallucination in any modality.    Suicidal Thoughts:  Yes.  without intent/plan  Homicidal Thoughts:  No  Memory:  Immediate;   Fair Recent;   Fair Remote;   Fair  Judgement:  Poor  Insight:  Good  Psychomotor Activity:  Decreased  Concentration:  Concentration: Fair and Attention Span: Fair  Recall:  [employer] of Knowledge:  Fair  Language:  Good  Akathisia:  No  Handed:    AIMS (if indicated):     Assets:  Communication Skills  ADL's:  Impaired  Cognition:  WNL  Sleep:  Number of Hours: 6     Treatment Plan Summary:  History of unipolar depression for decades. Patient is severely depressed. This is perpetuated by multiple psychosocial stressors. He is at risk of suicide. I discussed medications adjustments below. Patient consented to them respectively after we reviewed the risks and benefits.   Psychiatric: MDD recurrent   Medical:  Psychosocial:  Unemployed Homeless Financial constraints   PLAN: 1. Increase Sertraline to 150 mg daily 2. Discontinue AM Seroquel 3. Add Mirtazapine 15 mg HS 4. Encourage group activation 5. Encourage self grooming 6. Continue to monitor mood, behavior and interaction with peers.   [place], MD 08/24/2016, 11:45 AM

## 2016-08-24 NOTE — Plan of Care (Signed)
Problem: Health Behavior/Discharge Planning: Goal: Compliance with treatment plan for underlying cause of condition will improve Outcome: Progressing Patient taking all medications as prescribed. Medications reviewed with patient. Patient verbalized understanding and denied adverse reactions.

## 2016-08-24 NOTE — [facility] Group Notes (Signed)
[place] Group Notes:  (Nursing/MHT/Case Management/Adjunct)  Date:  08/24/2016  Time:  0900  Type of Therapy:  Nurse Education  Participation Level:  Did Not Attend  Participation Quality:    Affect:    Cognitive:    Insight:    Engagement in Group:    Modes of Intervention:    Summary of Progress/Problems: Patient invited to group however elected to remain in bed.  Loletta Specter [facility] 08/24/2016, 0930

## 2016-08-24 NOTE — Tx Team (Signed)
Interdisciplinary Treatment and Diagnostic Plan Update  08/24/2016 Time of Session: [place] MRN: [id number]  Principal Diagnosis: MDD without psychotic features  Secondary Diagnoses: Active Problems:   MDD (major depressive disorder), recurrent severe, without psychosis ([place])   Current Medications:  Current Facility-Administered Medications  Medication Dose Route Frequency Provider Last Rate Last Dose  . acetaminophen (TYLENOL) tablet 650 mg  650 mg Oral Q6H PRN Lurena Nida, NP      . busPIRone (BUSPAR) tablet 15 mg  15 mg Oral TID Sharma Covert, MD   15 mg at 08/24/16 0753  . gabapentin (NEURONTIN) capsule 400 mg  400 mg Oral TID Sharma Covert, MD   400 mg at 08/24/16 0753  . hydrOXYzine (ATARAX/VISTARIL) tablet 25 mg  25 mg Oral TID PRN Lurena Nida, NP   25 mg at 08/16/16 0904  . QUEtiapine (SEROQUEL) tablet 100 mg  100 mg Oral Daily Sharma Covert, MD   100 mg at 08/24/16 0753  . QUEtiapine (SEROQUEL) tablet 300 mg  300 mg Oral QHS Sharma Covert, MD   300 mg at 08/23/16 2144  . sertraline (ZOLOFT) tablet 100 mg  100 mg Oral Daily Derrill Center, NP   100 mg at 08/24/16 0753  . traZODone (DESYREL) tablet 50 mg  50 mg Oral QHS PRN Lurena Nida, NP   50 mg at 08/23/16 2144   PTA Medications: Prescriptions Prior to Admission  Medication Sig Dispense Refill Last Dose  . ibuprofen (ADVIL,MOTRIN) 200 MG tablet Take 600 mg by mouth every 6 (six) hours as needed for moderate pain.   Past Week at Unknown time  . FLUoxetine (PROZAC) 20 MG capsule Take 1 capsule (20 mg total) by mouth daily. (Patient not taking: Reported on 08/14/2016) 30 capsule 0 Not Taking at Unknown time  . QUEtiapine (SEROQUEL) 400 MG tablet Take 2 tablets (800 mg total) by mouth at bedtime. (Patient not taking: Reported on 08/14/2016) 60 tablet 0 Not Taking at Unknown time    Patient Stressors: Financial difficulties Marital or family conflict Other: Unspecified  Patient Strengths: Average  or above average intelligence Communication skills Supportive family/friends  Treatment Modalities: Medication Management, Group therapy, Case management,  1 to 1 session with clinician, Psychoeducation, Recreational therapy.   Physician Treatment Plan for Primary Diagnosis:MDD without psychotic features Long Term Goal(s): Improvement in symptoms so as ready for discharge Improvement in symptoms so as ready for discharge   Short Term Goals: Ability to identify changes in lifestyle to reduce recurrence of condition will improve Ability to verbalize feelings will improve Ability to disclose and discuss suicidal ideas Ability to demonstrate self-control will improve Ability to identify and develop effective coping behaviors will improve Ability to maintain clinical measurements within normal limits will improve Compliance with prescribed medications will improve Ability to identify triggers associated with substance abuse/mental health issues will improve Ability to identify changes in lifestyle to reduce recurrence of condition will improve Ability to verbalize feelings will improve Ability to disclose and discuss suicidal ideas Ability to demonstrate self-control will improve Ability to identify and develop effective coping behaviors will improve Ability to maintain clinical measurements within normal limits will improve Compliance with prescribed medications will improve Ability to identify triggers associated with substance abuse/mental health issues will improve  Medication Management: Evaluate patient's response, side effects, and tolerance of medication regimen.  Therapeutic Interventions: 1 to 1 sessions, Unit Group sessions and Medication administration.  Evaluation of Outcomes: Not Met  Physician  Treatment Plan for Secondary Diagnosis: Active Problems:   MDD (major depressive disorder), recurrent severe, without psychosis ([place])  Long Term Goal(s): Improvement in symptoms  so as ready for discharge Improvement in symptoms so as ready for discharge   Short Term Goals: Ability to identify changes in lifestyle to reduce recurrence of condition will improve Ability to verbalize feelings will improve Ability to disclose and discuss suicidal ideas Ability to demonstrate self-control will improve Ability to identify and develop effective coping behaviors will improve Ability to maintain clinical measurements within normal limits will improve Compliance with prescribed medications will improve Ability to identify triggers associated with substance abuse/mental health issues will improve Ability to identify changes in lifestyle to reduce recurrence of condition will improve Ability to verbalize feelings will improve Ability to disclose and discuss suicidal ideas Ability to demonstrate self-control will improve Ability to identify and develop effective coping behaviors will improve Ability to maintain clinical measurements within normal limits will improve Compliance with prescribed medications will improve Ability to identify triggers associated with substance abuse/mental health issues will improve     Medication Management: Evaluate patient's response, side effects, and tolerance of medication regimen.  Therapeutic Interventions: 1 to 1 sessions, Unit Group sessions and Medication administration.  Evaluation of Outcomes: Not Met   RN Treatment Plan for Primary Diagnosis: MDD without psychotic features Long Term Goal(s): Knowledge of disease and therapeutic regimen to maintain health will improve  Short Term Goals: Ability to remain free from injury will improve, Ability to identify and develop effective coping behaviors will improve and Compliance with prescribed medications will improve  Medication Management: RN will administer medications as ordered by provider, will assess and evaluate patient's response and provide education to patient for prescribed  medication. RN will report any adverse and/or side effects to prescribing provider.  Therapeutic Interventions: 1 on 1 counseling sessions, Psychoeducation, Medication administration, Evaluate responses to treatment, Monitor vital signs and CBGs as ordered, Perform/monitor CIWA, COWS, AIMS and Fall Risk screenings as ordered, Perform wound care treatments as ordered.  Evaluation of Outcomes: Not Met   LCSW Treatment Plan for Primary Diagnosis: MDD without psychotic features Long Term Goal(s): Safe transition to appropriate next level of care at discharge, Engage patient in therapeutic group addressing interpersonal concerns.  Short Term Goals: Engage patient in aftercare planning with referrals and resources, Facilitate patient progression through stages of change regarding substance use diagnoses and concerns and Identify triggers associated with mental health/substance abuse issues  Therapeutic Interventions: Assess for all discharge needs, 1 to 1 time with Social worker, Explore available resources and support systems, Assess for adequacy in community support network, Educate family and significant other(s) on suicide prevention, Complete Psychosocial Assessment, Interpersonal group therapy.  Evaluation of Outcomes: Not met   Progress in Treatment: Attending groups: No. Pt continues to isolate in room.  Participating in groups: No. Taking medication as prescribed: Yes. Toleration medication: Yes. Family/Significant other contact made: SPE completed with pt; pt declined to consent to family contact.  Patient understands diagnosis: Yes. Discussing patient identified problems/goals with staff: Yes. Medical problems stabilized or resolved: Yes. Denies suicidal/homicidal ideation: Yes. Issues/concerns per patient self-inventory: No. Other: n/a   New problem(s) identified: No, Describe:  n/a  New Short Term/Long Term Goal(s): detox; medication stabilization; development of comprehensive  mental wellness/sobriety plan.   Discharge Plan or Barriers: CSW assessing for appropriate referrals. He was last admitted to [facility] in 2013 and referred to [facility]. Pt reports that he has not followed  up with outpatient provider or received psychiatric medications in the past 5 years.  2/23: Pt will follow-up at [facility]; homeless; pt lethargic and not vested in discussing follow-up at this time.  2/27: Pt has been getting up intermittently but continues to isolate in room for majority of day and is not willing to attend groups. Some paranoia observed  Reason for Continuation of Hospitalization: Depression Medication stabilization Paranoia  Estimated Length of Stay: 1-3 days   Attendees: Patient: 08/24/2016 10:08 AM  Physician: Dr. Sanjuana Letters MD 08/24/2016 10:08 AM  Nursing: Clarene Critchley RN; Jinny Sanders RN 08/24/2016 10:08 AM  RN Care Manager: Lars Pinks CM 08/24/2016 10:08 AM  Social Worker: Maxie Better, LCSW; Matthew Saras, [place] 08/24/2016 10:08 AM  Recreational Therapist: Rhunette Croft 08/24/2016 10:08 AM  Other: Lindell Spar NP; 08/24/2016 10:08 AM  Other:  08/24/2016 10:08 AM  Other: 08/24/2016 10:08 AM    Scribe for Treatment Team: [place], LCSW 08/24/2016 10:08 AM

## 2016-08-24 NOTE — Progress Notes (Signed)
Nursing Progress Note 7p-7a  D) Patient presents with flat affect. Patient isolates to his room but is assertive with his needs. Patient is minimal with Probation officer but denies SI/HI/AVH or pain. Patient contracts for safety on the unit. Patient did not attend group. Patient tells Probation officer "I still have these negative thoughts in my head". Patient declined to elaborate.  A) Emotional support given. 1:1 interaction and active listening provided. Patient medicated with PM orders as prescribed. Medications reviewed with patient. Patient verbalized understanding of medications without further questions.  Snacks and fluids provided. Opportunities for questions or concerns presented to patient. Patient encouraged to continue to work on treatment goals. Labs, vital signs and patient behavior monitored throughout shift. Patient safety maintained with q15 min safety checks.  R) Patient receptive to interaction with nurse. Patient remains safe on the unit at this time. Patient denies any adverse medication reactions at this time. Patient is resting in bed without complaints. Will continue to monitor.

## 2016-08-24 NOTE — Progress Notes (Signed)
D: Patient remains isolative to room, bed. Patient remains flat, sad and depressed. Unwilling to attend groups, complete self inventory. Spoke with patient 1:1 who forwards minimal information. When asked if meds are working patient shrugs. Patient states, "there's just so much going on outside of here. Some many stressful things." Denies pain, physical problems.  A: Medicated per orders, no prns requested or required. Emotional support offered and self inventory encouraged along with completion of Suicide Safety Plan. Discussed POC with MD, SW.  Fall p/c in place and reviewed.   R: Patient verbalizes understanding of POC, fall prevention. Patient denies SI/HI and remains safe on level III obs.

## 2016-08-24 NOTE — BHH Group Notes (Signed)
[place] LCSW Group Therapy  08/24/2016 3:02 PM  Type of Therapy:  Group Therapy  Participation Level:  Did Not Attend-pt invited. Chose to remain in bed.   Summary of Progress/Problems: [facility] Speaker came to talk about his personal journey with substance abuse and addiction. The pt processed ways by which to relate to the speaker. [place] speaker provided handouts and educational information pertaining to groups and services offered by the [facility].   Nusaiba Guallpa N Smart LCSW 08/24/2016, 3:02 PM

## 2016-08-24 NOTE — Progress Notes (Signed)
Psychoeducational Group Note  Date:  08/24/2016 Time:  2255  Group Topic/Focus:  Wrap-Up Group:   The focus of this group is to help patients review their daily goal of treatment and discuss progress on daily workbooks.  Participation Level: Did Not Attend  Participation Quality:  Not Applicable  Affect:  Not Applicable  Cognitive:  Not Applicable  Insight:  Not Applicable  Engagement in Group: Not Applicable  Additional Comments:  The patient refused to attend the evening A.A. Meeting. He acknowledged that he does not feel well and that the medication is only "taking the edge off of my anxiety". He is unwilling to talk to the staff about his discharge plans.   Archie Balboa S 08/24/2016, 10:55 PM

## 2016-08-25 NOTE — Progress Notes (Signed)
[facility] MD Progress Note  08/25/2016 4:46 PM Frank [name]  MRN:  [id number] Subjective:   66 yo Caucasian male, single, lives with a roommate, unemployed. Long history of depression. Presented to the ER in company of his brother and girlfriend. Family concerned about him. Reported to have been feeling very down in the past couple of weeks. Not taking care of himself. Withdrawn and stays in his room all the time. Not eating and not sleeping. Asked his brother to shoot him. Reports a lot of financial stressors. Has been coping poorly and resorted to alcohol. BAL, UDS and routine labs were essentially normal at presentation.   Seen today. Was watching a movie prior to interview this evening. Reports feeling marginally better. Says he still feels down and trapped. Says he does not see a way out of his financial issues. Patient states that he is scared of confronting it. He still feels hopeless and worthless. He still has suicidal thoughts off and on. No intent to act here.  Nursing staff notes that he is still isolative. No engagement with peers. He ate his lunch but not breakfast. He slept well last night.   Discussed at team this morning. We are exploring long term state hospitalization.   Principal Problem:   MDD severe without psychotic features. Diagnosis:   Patient Active Problem List   Diagnosis Date Noted  . MDD (major depressive disorder), recurrent severe, without psychosis ([place]) [F33.2] 08/15/2016  . MDD (major depressive disorder) [F32.9] 05/20/2012  . Cocaine abuse [F14.10] 05/20/2012    Class: Chronic  . Chronic pain associated with significant psychosocial dysfunction [G89.4] 04/01/2012  . Anxiety [F41.9]   . Depression [F32.9]   . Drug addiction ([place]) [F19.20]    Total Time spent with patient: 30 minutes  Past Psychiatric History: As in H&P   Past Medical History:  Past Medical History:  Diagnosis Date  . Anxiety   . Depression   . Drug addiction ([place])    History reviewed.  No pertinent surgical history. Family History: History reviewed. No pertinent family history. Family Psychiatric  History: As in H&P  Social History:  History  Alcohol Use  . 0.6 oz/week  . 1 Cans of beer per week    Comment: PAIN PILLS--PERCOCET/VICODIN     History  Drug Use  . Types: Other-see comments, "Crack" cocaine, Cocaine    Comment: smoking crack for the past week    Social History   Social History  . Marital status: Divorced    Spouse name: N/A  . Number of children: N/A  . Years of education: N/A   Social History Main Topics  . Smoking status: Never Smoker  . Smokeless tobacco: Never Used  . Alcohol use 0.6 oz/week    1 Cans of beer per week     Comment: PAIN PILLS--PERCOCET/VICODIN  . Drug use: Yes    Types: Other-see comments, "Crack" cocaine, Cocaine     Comment: smoking crack for the past week  . Sexual activity: Not Asked     Comment: oxycontin   Other Topics Concern  . None   Social History Narrative  . None   Additional Social History:   Sleep: Good  Appetite:  Poor  Current Medications: Current Facility-Administered Medications  Medication Dose Route Frequency Provider Last Rate Last Dose  . acetaminophen (TYLENOL) tablet 650 mg  650 mg Oral Q6H PRN Lurena Nida, NP      . busPIRone (BUSPAR) tablet 15 mg  15 mg Oral TID  Sharma Covert, MD   15 mg at 08/25/16 1202  . gabapentin (NEURONTIN) capsule 400 mg  400 mg Oral TID Sharma Covert, MD   400 mg at 08/25/16 1202  . mirtazapine (REMERON) tablet 15 mg  15 mg Oral QHS Artist Beach, MD   15 mg at 08/24/16 2128  . QUEtiapine (SEROQUEL) tablet 300 mg  300 mg Oral QHS Sharma Covert, MD   300 mg at 08/24/16 2128  . sertraline (ZOLOFT) tablet 150 mg  150 mg Oral Daily Artist Beach, MD   150 mg at 08/25/16 0805  . traZODone (DESYREL) tablet 50 mg  50 mg Oral QHS PRN Lurena Nida, NP   50 mg at 08/24/16 2130    Lab Results: No results found for this or any previous visit  (from the past 48 hour(s)).  Blood Alcohol level:  Lab Results  Component Value Date   [facility] <5 08/14/2016   ETH <11 123XX123    Metabolic Disorder Labs: Lab Results  Component Value Date   HGBA1C 5.7 (H) 08/17/2016   MPG 117 08/17/2016   MPG 126 (H) 04/02/2012   No results found for: PROLACTIN Lab Results  Component Value Date   CHOL 330 (H) 04/02/2012   TRIG 244 (H) 04/02/2012   HDL 36 (L) 04/02/2012   CHOLHDL 9.2 04/02/2012   VLDL 49 (H) 04/02/2012   LDLCALC 245 (H) 04/02/2012   LDLCALC  08/25/2010    UNABLE TO CALCULATE IF TRIGLYCERIDE OVER 400 mg/dL        Total Cholesterol/HDL:CHD Risk Coronary Heart Disease Risk Table                     Men   Women  1/2 Average Risk   3.4   3.3  Average Risk       5.0   4.4  2 X Average Risk   9.6   7.1  3 X Average Risk  23.4   11.0        Use the calculated Patient Ratio above and the CHD Risk Table to determine the patient's CHD Risk.        ATP III CLASSIFICATION (LDL):  <100     mg/dL   Optimal  100-129  mg/dL   Near or Above                    Optimal  130-159  mg/dL   Borderline  160-189  mg/dL   High  >190     mg/dL   Very High    Physical Findings: AIMS: Facial and Oral Movements Muscles of Facial Expression: None, normal Lips and Perioral Area: None, normal Jaw: None, normal Tongue: None, normal,Extremity Movements Upper (arms, wrists, hands, fingers): None, normal Lower (legs, knees, ankles, toes): None, normal, Trunk Movements Neck, shoulders, hips: None, normal, Overall Severity Severity of abnormal movements (highest score from questions above): None, normal Incapacitation due to abnormal movements: None, normal Patient's awareness of abnormal movements (rate only patient's report): No Awareness, Dental Status Current problems with teeth and/or dentures?: No Does patient usually wear dentures?: No  CIWA:  CIWA-Ar Total: 4 COWS:  COWS Total Score: 1  Musculoskeletal: Strength & Muscle Tone: within  normal limits Gait & Station: normal Patient leans: N/A  Psychiatric Specialty Exam: Physical Exam  Constitutional: He is oriented to person, place, and time. He appears well-developed.  HENT:  Head: Normocephalic.  Eyes: Pupils are equal, round, and reactive  to light.  Neck: Normal range of motion. Neck supple.  Cardiovascular: Normal rate and regular rhythm.   Respiratory: Effort normal and breath sounds normal.  GI: Soft. Bowel sounds are normal.  Musculoskeletal: Normal range of motion.  Neurological: He is alert and oriented to person, place, and time.  Skin: Skin is warm and dry.  Psychiatric:  As above    ROS  Blood pressure (!) 86/64, pulse 86, temperature 98.4 F (36.9 C), temperature source Oral, resp. rate 16, height 5' 7.5" (1.715 m), weight 81.6 kg (180 lb).Body mass index is 27.78 kg/m.  General Appearance:  Had a wash today. More interactive. Less withdrawn.   Eye Contact:  Moderate  Speech:  Spontaneous. Decrease tone and rate   Volume:  Decreased  Mood:  Depressed and Hopeless  Affect:  Blunt and Congruent  Thought Process:  Goal Directed  Orientation:  Full (Time, Place, and Person)  Thought Content:   No preoccupation with violent thoughts. Negative ruminations about the future. Somatic preocuupation.  No hallucination in any modality.    Suicidal Thoughts:  Yes.  without intent/plan  Homicidal Thoughts:  No  Memory:  Immediate;   Fair Recent;   Fair Remote;   Fair  Judgement:  Poor  Insight:  Good  Psychomotor Activity:  Decreased  Concentration:  Concentration: Fair and Attention Span: Fair  Recall:  [employer] of Knowledge:  Fair  Language:  Good  Akathisia:  No  Handed:    AIMS (if indicated):     Assets:  Communication Skills  ADL's:  Impaired  Cognition:  WNL  Sleep:  Number of Hours: 5.5     Treatment Plan Summary:  Patient is still depressed and hopeless. He is marginally better. He remains at high risk of suicide.     Psychiatric: MDD recurrent   Medical:  Psychosocial:  Unemployed Homeless Financial constraints   PLAN: 1. Continue current regimen 2. Continue to monitor mood, behavior and interaction with peers.   Artist Beach, MD 08/25/2016, 4:46 PMPatient ID: Frank [name], male   DOB: 04/07/1951, 66 y.o.   MRN: [id number]

## 2016-08-25 NOTE — Progress Notes (Signed)
DAR NOTE: Patient presents with anxious affect and depressed mood.  Pt has been isolating a lot, spent the whole day in bed except for meals and medication. Pt stated he has a lot in his mind, but not willing to explain. Denies pain, auditory and visual hallucinations.  Rates depression at 5, hopelessness at 6, and anxiety at 7.  Maintained on routine safety checks.  Medications given as prescribed.  Support and encouragement offered as needed.  Patient observed socializing with peers in the dayroom.  Offered no complaint.

## 2016-08-25 NOTE — [facility] Group Notes (Signed)
[place] LCSW Group Therapy  08/25/2016 11:46 AM  Type of Therapy:  Group Therapy  Participation Level:  Did Not Attend-pt invited. Chose to remain in bed.   Summary of Progress/Problems: Today's Topic: Overcoming Obstacles. Patients identified one short term goal and potential obstacles in reaching this goal. Patients processed barriers involved in overcoming these obstacles. Patients identified steps necessary for overcoming these obstacles and explored motivation (internal and external) for facing these difficulties head on.   Ottilie Wigglesworth N Smart LCSW 08/25/2016, 11:46 AM

## 2016-08-25 NOTE — Progress Notes (Signed)
Recreation Therapy Notes  Date: 08/25/16 Time: 0930 Location: 300 Hall Group Room  Group Topic: Stress Management  Goal Area(s) Addresses:  Patient will verbalize importance of using healthy stress management.  Patient will identify positive emotions associated with healthy stress management.   Intervention: Stress Management  Activity :  Meditation.  LRT played a meditation from the [place] so patients could practice the technique of meditation.  Patients were to follow along as the meditation was played to engage in the activity.  Education:  Stress Management, Discharge Planning.   Education Outcome: Acknowledges edcuation/In group clarification offered/Needs additional education  Clinical Observations/Feedback: Pt did not attend group.    Victorino Sparrow, LRT/CTRS         Victorino Sparrow A 08/25/2016 12:39 PM

## 2016-08-25 NOTE — Progress Notes (Signed)
Per MD request, [facility] referral made 08/25/2016 11:53 AM   Maxie Better, MSW, LCSW Clinical Social Worker 08/25/2016 11:53 AM

## 2016-08-25 NOTE — Social Work (Signed)
Referred to [facility].  Phone demographics given.Judeen Hammans [phone].  Edwyna Shell, LCSW Lead Clinical Social Worker Phone:  [phone]

## 2016-08-25 NOTE — Progress Notes (Signed)
Pt presents sad/depressed this evening.  He did not attend evening group saying he felt too anxious to be in a group meeting.  He denies SI/HI/AVH.  He talked about being very depressed about things outside of the hospital and that there was so much stress, but he would not elaborate on what were the specific stressors.  Writer reviewed the medications that are scheduled for the pt tonight and he also requested Trazodone at bedtime which was given.  Support and encouragement offered.  Discharge plans are in process.  Safety maintained with q15 minute checks.

## 2016-08-25 NOTE — Progress Notes (Signed)
Pt attended AA group this evening.  

## 2016-08-25 NOTE — Progress Notes (Signed)
Patient ID: Frank [name], male   DOB: 1951/03/11, 66 y.o.   MRN: [id number]  Pt currently presents with a flat affect and isolative behavior. Pt mood is brighter than previously. Pt denies any current concerns. Pt reports poor sleep with current medication regimen.   Pt provided with medications per providers orders. Pt's labs and vitals were monitored throughout the night. Pt given a 1:1 about emotional and mental status. Pt supported and encouraged to express concerns and questions. Pt educated on medications.  Pt's safety ensured with 15 minute and environmental checks. Pt currently denies SI/HI and A/V hallucinations. Pt verbally agrees to seek staff if SI/HI or A/VH occurs and to consult with staff before acting on any harmful thoughts. Will continue POC.

## 2016-08-26 MED ORDER — LORAZEPAM 1 MG PO TABS
1.0000 mg | ORAL_TABLET | Freq: Once | ORAL | Status: AC
  Administered 2016-08-26: 1 mg via ORAL

## 2016-08-26 MED ORDER — LORAZEPAM 1 MG PO TABS
ORAL_TABLET | ORAL | Status: AC
  Administered 2016-08-26: 1 mg via ORAL
  Filled 2016-08-26: qty 1

## 2016-08-26 MED ORDER — MIRTAZAPINE 30 MG PO TABS
30.0000 mg | ORAL_TABLET | Freq: Every day | ORAL | Status: DC
  Administered 2016-08-26 – 2016-09-02 (×8): 30 mg via ORAL
  Filled 2016-08-26 (×5): qty 1
  Filled 2016-08-26: qty 2
  Filled 2016-08-26 (×3): qty 1
  Filled 2016-08-26: qty 7
  Filled 2016-08-26: qty 1

## 2016-08-26 NOTE — Progress Notes (Signed)
[facility] MD Progress Note  08/26/2016 1:14 PM Frank [name]  MRN:  [id number] Subjective:   66 yo Caucasian male, single, lives with a roommate, unemployed. Long history of depression. Presented to the ER in company of his brother and girlfriend. Family concerned about him. Reported to have been feeling very down in the past couple of weeks. Not taking care of himself. Withdrawn and stays in his room all the time. Not eating and not sleeping. Asked his brother to shoot him. Reports a lot of financial stressors. Has been coping poorly and resorted to alcohol. BAL, UDS and routine labs were essentially normal at presentation.   Nursing staff reports that he was up most of the night. He is still down and withdrawn. He has not expressed any thoughts of suicide. No evidence of psychosis. He is still not grooming self well.  Seen today. In bed. Says he is trying to catch a nap. Has been ruminating a lot about his problems. Says his mind could not rest last night. He moved his bowel yesterday. Says now he is reassured that he does not have any problems. He feels tired today and wants to rest. I encouraged him to resist the temptation of napping as that would keep him up at night again. Patient is agreeable to engage self in groups and stay activated. We agreed to adjust his medications as detailed below.   Principal Problem:   MDD severe without psychotic features. Diagnosis:   Patient Active Problem List   Diagnosis Date Noted  . MDD (major depressive disorder), recurrent severe, without psychosis ([place]) [F33.2] 08/15/2016  . MDD (major depressive disorder) [F32.9] 05/20/2012  . Cocaine abuse [F14.10] 05/20/2012    Class: Chronic  . Chronic pain associated with significant psychosocial dysfunction [G89.4] 04/01/2012  . Anxiety [F41.9]   . Depression [F32.9]   . Drug addiction ([place]) [F19.20]    Total Time spent with patient: 30 minutes  Past Psychiatric History: As in H&P   Past Medical History:  Past  Medical History:  Diagnosis Date  . Anxiety   . Depression   . Drug addiction ([place])    History reviewed. No pertinent surgical history. Family History: History reviewed. No pertinent family history. Family Psychiatric  History: As in H&P  Social History:  History  Alcohol Use  . 0.6 oz/week  . 1 Cans of beer per week    Comment: PAIN PILLS--PERCOCET/VICODIN     History  Drug Use  . Types: Other-see comments, "Crack" cocaine, Cocaine    Comment: smoking crack for the past week    Social History   Social History  . Marital status: Divorced    Spouse name: N/A  . Number of children: N/A  . Years of education: N/A   Social History Main Topics  . Smoking status: Never Smoker  . Smokeless tobacco: Never Used  . Alcohol use 0.6 oz/week    1 Cans of beer per week     Comment: PAIN PILLS--PERCOCET/VICODIN  . Drug use: Yes    Types: Other-see comments, "Crack" cocaine, Cocaine     Comment: smoking crack for the past week  . Sexual activity: Not Asked     Comment: oxycontin   Other Topics Concern  . None   Social History Narrative  . None   Additional Social History:   Sleep: Good  Appetite:  Poor  Current Medications: Current Facility-Administered Medications  Medication Dose Route Frequency Provider Last Rate Last Dose  . acetaminophen (TYLENOL) tablet 650  mg  650 mg Oral Q6H PRN Lurena Nida, NP      . busPIRone (BUSPAR) tablet 15 mg  15 mg Oral TID Sharma Covert, MD   15 mg at 08/26/16 1116  . gabapentin (NEURONTIN) capsule 400 mg  400 mg Oral TID Sharma Covert, MD   400 mg at 08/26/16 1116  . mirtazapine (REMERON) tablet 15 mg  15 mg Oral QHS Artist Beach, MD   15 mg at 08/25/16 2144  . QUEtiapine (SEROQUEL) tablet 300 mg  300 mg Oral QHS Sharma Covert, MD   300 mg at 08/25/16 2144  . sertraline (ZOLOFT) tablet 150 mg  150 mg Oral Daily Artist Beach, MD   150 mg at 08/26/16 0804  . traZODone (DESYREL) tablet 50 mg  50 mg Oral QHS PRN  Lurena Nida, NP   50 mg at 08/25/16 2144    Lab Results: No results found for this or any previous visit (from the past 48 hour(s)).  Blood Alcohol level:  Lab Results  Component Value Date   [facility] <5 08/14/2016   ETH <11 123XX123    Metabolic Disorder Labs: Lab Results  Component Value Date   HGBA1C 5.7 (H) 08/17/2016   MPG 117 08/17/2016   MPG 126 (H) 04/02/2012   No results found for: PROLACTIN Lab Results  Component Value Date   CHOL 330 (H) 04/02/2012   TRIG 244 (H) 04/02/2012   HDL 36 (L) 04/02/2012   CHOLHDL 9.2 04/02/2012   VLDL 49 (H) 04/02/2012   LDLCALC 245 (H) 04/02/2012   LDLCALC  08/25/2010    UNABLE TO CALCULATE IF TRIGLYCERIDE OVER 400 mg/dL        Total Cholesterol/HDL:CHD Risk Coronary Heart Disease Risk Table                     Men   Women  1/2 Average Risk   3.4   3.3  Average Risk       5.0   4.4  2 X Average Risk   9.6   7.1  3 X Average Risk  23.4   11.0        Use the calculated Patient Ratio above and the CHD Risk Table to determine the patient's CHD Risk.        ATP III CLASSIFICATION (LDL):  <100     mg/dL   Optimal  100-129  mg/dL   Near or Above                    Optimal  130-159  mg/dL   Borderline  160-189  mg/dL   High  >190     mg/dL   Very High    Physical Findings: AIMS: Facial and Oral Movements Muscles of Facial Expression: None, normal Lips and Perioral Area: None, normal Jaw: None, normal Tongue: None, normal,Extremity Movements Upper (arms, wrists, hands, fingers): None, normal Lower (legs, knees, ankles, toes): None, normal, Trunk Movements Neck, shoulders, hips: None, normal, Overall Severity Severity of abnormal movements (highest score from questions above): None, normal Incapacitation due to abnormal movements: None, normal Patient's awareness of abnormal movements (rate only patient's report): No Awareness, Dental Status Current problems with teeth and/or dentures?: No Does patient usually wear  dentures?: No  CIWA:  CIWA-Ar Total: 4 COWS:  COWS Total Score: 1  Musculoskeletal: Strength & Muscle Tone: within normal limits Gait & Station: normal Patient leans: N/A  Psychiatric Specialty Exam: Physical  Exam  Constitutional: He is oriented to person, place, and time. He appears well-developed.  HENT:  Head: Normocephalic.  Eyes: Pupils are equal, round, and reactive to light.  Neck: Normal range of motion. Neck supple.  Cardiovascular: Normal rate and regular rhythm.   Respiratory: Effort normal and breath sounds normal.  GI: Soft. Bowel sounds are normal.  Musculoskeletal: Normal range of motion.  Neurological: He is alert and oriented to person, place, and time.  Skin: Skin is warm and dry.  Psychiatric:  As above    ROS  Blood pressure (!) 86/64, pulse 86, temperature 98.4 F (36.9 C), temperature source Oral, resp. rate 16, height 5' 7.5" (1.715 m), weight 81.6 kg (180 lb).Body mass index is 27.78 kg/m.  General Appearance:  In bed, poor self care. Psychomotor retardation. Worried look.   Eye Contact: Good  Speech:  Spontaneous. Decrease tone and rate   Volume:  Decreased  Mood:  Depressed and Hopeless  Affect:  Blunt and Congruent  Thought Process:  Goal Directed  Orientation:  Full (Time, Place, and Person)  Thought Content:   No preoccupation with violent thoughts. Negative ruminations about the future. Somatic preocuupation.  No hallucination in any modality.    Suicidal Thoughts:  No  Homicidal Thoughts:  No  Memory:  Immediate;   Fair Recent;   Fair Remote;   Fair  Judgement:  Poor  Insight:  Good  Psychomotor Activity:  Decreased  Concentration:  Concentration: Fair and Attention Span: Fair  Recall:  [employer] of Knowledge:  Fair  Language:  Good  Akathisia:  No  Handed:    AIMS (if indicated):     Assets:  Communication Skills  ADL's:  Impaired  Cognition:  WNL  Sleep:  Number of Hours: 5.75     Treatment Plan Summary:  Patient is  still depressed and hopeless. He is still at risk of suicide. We have agreed on medication adjustments below.    Psychiatric: MDD recurrent   Medical:  Psychosocial:  Unemployed Homeless Financial constraints   PLAN: 1. Increase Mirtazapine to 30 mg HS 2. Continue to monitor mood, behavior and interaction with peers. 3. Continue to encourage grooming and activation.    Artist Beach, MD 08/26/2016, 1:14 PMPatient ID: Frank [name], male   DOB: [birth year], 66 y.o.   MRN: [id number] Patient ID: Frank [name], male   DOB: 1951-03-15, 66 y.o.   MRN: [id number]

## 2016-08-26 NOTE — Plan of Care (Signed)
Problem: Medication: Goal: Compliance with prescribed medication regimen will improve Outcome: Progressing Compliant with medication regimen  Problem: Self-Concept: Goal: Ability to disclose and discuss suicidal ideas will improve Outcome: Progressing Denies SI/HI/AVH

## 2016-08-26 NOTE — Progress Notes (Signed)
Adult Psychoeducational Group Note  Date:  08/26/2016 Time:  9:25 PM  Group Topic/Focus:  Wrap-Up Group:   The focus of this group is to help patients review their daily goal of treatment and discuss progress on daily workbooks.  Participation Level:  Minimal  Participation Quality:  Appropriate  Affect:  Flat  Cognitive:  Alert  Insight: Lacking  Engagement in Group:  Lacking  Modes of Intervention:  Discussion  Additional Comments:  Pt rated his day 3/10. Pt does not have any goals.  Wynelle Fanny R 08/26/2016, 9:25 PM

## 2016-08-26 NOTE — Progress Notes (Signed)
Patient ID: THUNDER RIGONI, male   DOB: 07-05-1950, 66 y.o.   MRN: [id number]  Pt currently presents with an agitated affect and anxious, restless behavior. Pt states "It was an awful day." Pt reports poor sleep with current medication regimen. Reports increased anxiety. Reports concerns about inability to stay asleep.  Pt provided with medications per providers orders. Pt's labs and vitals were monitored throughout the night. Pt given a 1:1 about emotional and mental status. Pt supported and encouraged to express concerns and questions. Pt educated on medications. Provider notified of patients concerns after patient was still alert and oriented one hour post sleep medication administration.   Pt's safety ensured with 15 minute and environmental checks. Pt currently denies SI/HI and A/V hallucinations. Pt verbally agrees to seek staff if SI/HI or A/VH occurs and to consult with staff before acting on any harmful thoughts. Will continue POC.

## 2016-08-26 NOTE — B[facility] Group Notes (Signed)
[place] LCSW Group Therapy  08/26/2016 3:02 PM  Type of Therapy:  Group Therapy  Participation Level:  Minimal  Participation Quality:  Resistant  Affect:  Depressed and Flat  Cognitive:  Oriented  Insight:  Limited  Engagement in Therapy:  Limited  Modes of Intervention:  Discussion, Education, Exploration, Problem-solving, Socialization and Support  Summary of Progress/Problems: Emotion Regulation: This group focused on both positive and negative emotion identification and allowed group members to process ways to identify feelings, regulate negative emotions, and find healthy ways to manage internal/external emotions. Group members were asked to reflect on a time when their reaction to an emotion led to a negative outcome and explored how alternative responses using emotion regulation would have benefited them. Group members were also asked to discuss a time when emotion regulation was utilized when a negative emotion was experienced.   Frank [name] N Smart LCSW 08/26/2016, 3:02 PM

## 2016-08-26 NOTE — Progress Notes (Addendum)
D:  Patient awake and alert; oriented x 4; he denies suicidal and homicidal ideation and AVH; no self-injurious behaviors noted or reported. He reports that he didn't sleep well last night. A:  Medications given as scheduled;  Emotional support provided; encouraged him to seek assistance with needs/concerns. R:  Safety maintained on unit.

## 2016-08-27 MED ORDER — SERTRALINE HCL 100 MG PO TABS
200.0000 mg | ORAL_TABLET | Freq: Every day | ORAL | Status: DC
  Administered 2016-08-28 – 2016-09-03 (×7): 200 mg via ORAL
  Filled 2016-08-27 (×2): qty 2
  Filled 2016-08-27: qty 14
  Filled 2016-08-27 (×7): qty 2

## 2016-08-27 MED ORDER — LORAZEPAM 1 MG PO TABS
1.0000 mg | ORAL_TABLET | Freq: Once | ORAL | Status: AC | PRN
  Administered 2016-08-29: 1 mg via ORAL
  Filled 2016-08-27: qty 1

## 2016-08-27 NOTE — Tx Team (Signed)
Interdisciplinary Treatment and Diagnostic Plan Update  08/27/2016 Time of Session: [place] MRN: [id number]  Principal Diagnosis: MDD without psychotic features  Secondary Diagnoses: Principal Problem:   MDD (major depressive disorder), recurrent severe, without psychosis ([place])   Current Medications:  Current Facility-Administered Medications  Medication Dose Route Frequency Provider Last Rate Last Dose  . acetaminophen (TYLENOL) tablet 650 mg  650 mg Oral Q6H PRN Lurena Nida, NP      . busPIRone (BUSPAR) tablet 15 mg  15 mg Oral TID Sharma Covert, MD   15 mg at 08/27/16 [phone]  . gabapentin (NEURONTIN) capsule 400 mg  400 mg Oral TID Sharma Covert, MD   400 mg at 08/27/16 9357  . mirtazapine (REMERON) tablet 30 mg  30 mg Oral QHS Artist Beach, MD   30 mg at 08/26/16 2129  . QUEtiapine (SEROQUEL) tablet 300 mg  300 mg Oral QHS Sharma Covert, MD   300 mg at 08/26/16 2129  . sertraline (ZOLOFT) tablet 150 mg  150 mg Oral Daily Artist Beach, MD   150 mg at 08/27/16 0823  . traZODone (DESYREL) tablet 50 mg  50 mg Oral QHS PRN Lurena Nida, NP   50 mg at 08/26/16 2129   PTA Medications: Prescriptions Prior to Admission  Medication Sig Dispense Refill Last Dose  . ibuprofen (ADVIL,MOTRIN) 200 MG tablet Take 600 mg by mouth every 6 (six) hours as needed for moderate pain.   Past Week at Unknown time  . FLUoxetine (PROZAC) 20 MG capsule Take 1 capsule (20 mg total) by mouth daily. (Patient not taking: Reported on 08/14/2016) 30 capsule 0 Not Taking at Unknown time  . QUEtiapine (SEROQUEL) 400 MG tablet Take 2 tablets (800 mg total) by mouth at bedtime. (Patient not taking: Reported on 08/14/2016) 60 tablet 0 Not Taking at Unknown time    Patient Stressors: Financial difficulties Marital or family conflict Other: Unspecified  Patient Strengths: Average or above average intelligence Communication skills Supportive family/friends  Treatment Modalities:  Medication Management, Group therapy, Case management,  1 to 1 session with clinician, Psychoeducation, Recreational therapy.   Physician Treatment Plan for Primary Diagnosis:MDD without psychotic features Long Term Goal(s): Improvement in symptoms so as ready for discharge Improvement in symptoms so as ready for discharge   Short Term Goals: Ability to identify changes in lifestyle to reduce recurrence of condition will improve Ability to verbalize feelings will improve Ability to disclose and discuss suicidal ideas Ability to demonstrate self-control will improve Ability to identify and develop effective coping behaviors will improve Ability to maintain clinical measurements within normal limits will improve Compliance with prescribed medications will improve Ability to identify triggers associated with substance abuse/mental health issues will improve Ability to identify changes in lifestyle to reduce recurrence of condition will improve Ability to verbalize feelings will improve Ability to disclose and discuss suicidal ideas Ability to demonstrate self-control will improve Ability to identify and develop effective coping behaviors will improve Ability to maintain clinical measurements within normal limits will improve Compliance with prescribed medications will improve Ability to identify triggers associated with substance abuse/mental health issues will improve  Medication Management: Evaluate patient's response, side effects, and tolerance of medication regimen.  Therapeutic Interventions: 1 to 1 sessions, Unit Group sessions and Medication administration.  Evaluation of Outcomes: Not Met  Physician Treatment Plan for Secondary Diagnosis: Principal Problem:   MDD (major depressive disorder), recurrent severe, without psychosis ([place])  Long Term Goal(s): Improvement in  symptoms so as ready for discharge Improvement in symptoms so as ready for discharge   Short Term Goals:  Ability to identify changes in lifestyle to reduce recurrence of condition will improve Ability to verbalize feelings will improve Ability to disclose and discuss suicidal ideas Ability to demonstrate self-control will improve Ability to identify and develop effective coping behaviors will improve Ability to maintain clinical measurements within normal limits will improve Compliance with prescribed medications will improve Ability to identify triggers associated with substance abuse/mental health issues will improve Ability to identify changes in lifestyle to reduce recurrence of condition will improve Ability to verbalize feelings will improve Ability to disclose and discuss suicidal ideas Ability to demonstrate self-control will improve Ability to identify and develop effective coping behaviors will improve Ability to maintain clinical measurements within normal limits will improve Compliance with prescribed medications will improve Ability to identify triggers associated with substance abuse/mental health issues will improve     Medication Management: Evaluate patient's response, side effects, and tolerance of medication regimen.  Therapeutic Interventions: 1 to 1 sessions, Unit Group sessions and Medication administration.  Evaluation of Outcomes: Progressing    RN Treatment Plan for Primary Diagnosis: MDD without psychotic features Long Term Goal(s): Knowledge of disease and therapeutic regimen to maintain health will improve  Short Term Goals: Ability to remain free from injury will improve, Ability to identify and develop effective coping behaviors will improve and Compliance with prescribed medications will improve  Medication Management: RN will administer medications as ordered by provider, will assess and evaluate patient's response and provide education to patient for prescribed medication. RN will report any adverse and/or side effects to prescribing provider.  Therapeutic  Interventions: 1 on 1 counseling sessions, Psychoeducation, Medication administration, Evaluate responses to treatment, Monitor vital signs and CBGs as ordered, Perform/monitor CIWA, COWS, AIMS and Fall Risk screenings as ordered, Perform wound care treatments as ordered.  Evaluation of Outcomes: Progressing    LCSW Treatment Plan for Primary Diagnosis: MDD without psychotic features Long Term Goal(s): Safe transition to appropriate next level of care at discharge, Engage patient in therapeutic group addressing interpersonal concerns.  Short Term Goals: Engage patient in aftercare planning with referrals and resources, Facilitate patient progression through stages of change regarding substance use diagnoses and concerns and Identify triggers associated with mental health/substance abuse issues  Therapeutic Interventions: Assess for all discharge needs, 1 to 1 time with Social worker, Explore available resources and support systems, Assess for adequacy in community support network, Educate family and significant other(s) on suicide prevention, Complete Psychosocial Assessment, Interpersonal group therapy.  Evaluation of Outcomes: progressing    Progress in Treatment: Attending groups: Yes.  Participating in groups: Yes Taking medication as prescribed: Yes. Toleration medication: Yes. Family/Significant other contact made: SPE completed with pt; pt declined to consent to family contact.  Patient understands diagnosis: Yes. Discussing patient identified problems/goals with staff: Yes. Medical problems stabilized or resolved: Yes. Denies suicidal/homicidal ideation: Yes. Issues/concerns per patient self-inventory: No. Other: n/a   New problem(s) identified: No, Describe:  n/a  New Short Term/Long Term Goal(s): detox; medication stabilization; development of comprehensive mental wellness/sobriety plan.   Discharge Plan or Barriers: CSW assessing for appropriate referrals. He was last  admitted to [facility] in 2013 and referred to [facility]. Pt reports that he has not followed up with outpatient provider or received psychiatric medications in the past 5 years.  2/23: Pt will [facility] at [facility]; homeless; pt lethargic and not vested in discussing follow-up at  this time.  2/27: Pt has been getting up intermittently but continues to isolate in room for majority of day and is not willing to attend groups. Some paranoia observed 3/2: Pt is doing better--coming to groups; more interactive in the milieu; engaging with CSW regarding discharge plan. Pt was made aware that he was placed on [facility] waitlist and was adament that he did not want to go there. He was encouraged to continue working with CSW regarding safe and appropriate discharge plan.   Reason for Continuation of Hospitalization: Depression Medication stabilization  Estimated Length of Stay: 3-4 days  Attendees: Patient: 08/27/2016 11:53 AM  Physician: Dr. Sanjuana Letters MD 08/27/2016 11:53 AM  Nursing: Resa Miner RN 08/27/2016 11:53 AM  RN Care Manager: Lars Pinks CM 08/27/2016 11:53 AM  Social Worker: Maxie Better, LCSW;  08/27/2016 11:53 AM  Recreational Therapist: Rhunette Croft 08/27/2016 11:53 AM  Other: Lindell Spar NP; Ricky Ala NP 08/27/2016 11:53 AM  Other:  08/27/2016 11:53 AM  Other: 08/27/2016 11:53 AM    Scribe for Treatment Team: [place], LCSW 08/27/2016 11:53 AM

## 2016-08-27 NOTE — Progress Notes (Signed)
Recreation Therapy Notes  Date: 08/27/16 Time: 0930 Location: 300 [address] Dayroom  Group Topic: Stress Management  Goal Area(s) Addresses:  Patient will verbalize importance of using healthy stress management.  Patient will identify positive emotions associated with healthy stress management.   Intervention: Stress Management  Activity :  Meditation.  LRT introduced the stress management technique of meditation.  LRT played a meditation on resilience from the Calm app to allow the patients to engage in the technique.  Patients were to follow along as the meditation played to participate in the technique.   Education:  Stress Management, Discharge Planning.   Education Outcome: Acknowledges edcuation/In group clarification offered/Needs additional education  Clinical Observations/Feedback: Pt did not attend group.   Victorino Sparrow, LRT/CTRS         Victorino Sparrow A 08/27/2016 12:23 PM

## 2016-08-27 NOTE — BHH Group Notes (Signed)
[place] LCSW Group Therapy  08/27/2016 12:55 PM  Type of Therapy:  Group Therapy  Participation Level:  Minimal  Participation Quality:  Resistant  Affect:  Depressed and Flat  Cognitive:  Oriented  Insight:  Limited  Engagement in Therapy:  Limited  Modes of Intervention:  Confrontation, Discussion, Education, Problem-solving, Socialization and Support  Summary of Progress/Problems: Self Sabotage: Patients were asked to identify examples of self-sabotoge that they have experienced and talk about how to avoid this behavior in the future.   Richardson Dubree N Smart LCSW 08/27/2016, 12:55 PM

## 2016-08-27 NOTE — Progress Notes (Signed)
Nursing Note 08/27/2016 [id number]  Data Reports sleeping poorly with PRN sleep med "I took the ativan and I didn't get to sleep until about 3."  Did not complete self-inventory sheet.  Denies HI, SI, AVH.  Patient states he feels calm this AM but reported last night he was very anxious.  States he will try to get out of his room more today and go to groups in the afternoon.  Guarded.  Reports anxiety "is there" but "tolerable."  When engaged to see if anything was bothering him he said "too much to even say, I don't know where to start."   Action Spoke with patient 1:1, nurse offered support to patient throughout shift..  Continues to be monitored on 15 minute checks for safety.  Response Remains safe on unit.
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
## 2016-08-27 NOTE — Progress Notes (Signed)
D.  Pt pleasant but anxious on approach, reports difficulty sleeping at night.  Pt was positive for evening AA group, minimal interaction on unit otherwise.  Pt states he received dose of Ativan last night and it worked well for sleep.  Pt states "I've got to be able to sleep.  Pt denies SI/HI/hallucinations at this time.  A.  Support and encouragement offered, medication given as ordered.  Spoke with NP about Ativan and prn dose ordered x one if necessary tonight.  R.  Pt remains safe on the unit, will continue to monitor.

## 2016-08-28 NOTE — BHH Group Notes (Signed)
[place] LCSW Group Therapy Note  08/28/2016 at 10:00 AM  Type of Therapy and Topic:  Group Therapy: Avoiding Self-Sabotaging and Enabling Behaviors  Participation Level: Present only  Participation Quality:  Present onlu  Affect:  Flat and Depressed  Cognitive:oriented  Insight: None shared  Engagement in Therapy: None noted  Therapeutic models used Cognitive Behavioral Therapy Person-Centered Therapy Motivational Interviewing   Summary of Patient Progress: The main focus of today's process group was to discuss what "self-sabotage" means and use Motivational Interviewing to discuss what benefits, negative or positive, were involved in a self-identified self-sabotaging behavior. We then talked about reasons the patient may want to change the behavior and their current desire to change. Patients were encouraged to process the personal cost of their negative habits. Patient did not contribute to discussion.   Sheilah Pigeon, LCSW

## 2016-08-28 NOTE — BHH Group Notes (Signed)
[facility] Group Notes:  (Nursing/MHT/Case Management/Adjunct)  Date:  08/28/2016  Time:  4:14 PM  Type of Therapy:  Nurse Education  Participation Level:  Active  Participation Quality:  Appropriate  Affect:  Appropriate  Cognitive:  Appropriate  Insight:  Appropriate  Engagement in Group:  Engaged  Modes of Intervention:  Discussion  Summary of Progress/Problems:  Education group regarding communication styles. Patient was able to identify his style as- "Passive."  Cheri Kearns 08/28/2016, 4:14 PM

## 2016-08-28 NOTE — Progress Notes (Signed)
[facility] MD Progress Note  08/28/2016 2:52 PM YOUNUS GUARNIERI  MRN:  [id number] Subjective:   66 yo Caucasian male, single, lives with a roommate, unemployed. Long history of depression. Presented to the ER in company of his brother and girlfriend. Family concerned about him. Reported to have been feeling very down in the past couple of weeks. Not taking care of himself. Withdrawn and stays in his room all the time. Not eating and not sleeping. Asked his brother to shoot him. Reports a lot of financial stressors. Has been coping poorly and resorted to alcohol. BAL, UDS and routine labs were essentially normal at presentation.   Nursing staff reports that he has been engaging more with unit activities. No behavioral issues. Grooming self more. Has not expressed any thoughts of suicide. Lately. has not been observed to be internally preoccupied. He is taking his medications as prescribed. Slept well last night. No PRN's.  Seen today says he is feeling better. He slept well last night. Says he has been out in the day area all day. He has been participating in unit activities. His mind still wanders into his problems. Says he has not had suicidal thoughts accompany that lately. He still ells trapped but denies viewing suicide as a way out. Says he has not communicated with his brother or girlfriend since he has been here. He plans to do that as he gets better. Tolerating recent medication adjustment well. No evidence of psychosis. No evidence of mania.   Principal Problem:   MDD severe without psychotic features. Diagnosis:   Patient Active Problem List   Diagnosis Date Noted  . MDD (major depressive disorder), recurrent severe, without psychosis ([place]) [F33.2] 08/15/2016  . MDD (major depressive disorder) [F32.9] 05/20/2012  . Cocaine abuse [F14.10] 05/20/2012    Class: Chronic  . Chronic pain associated with significant psychosocial dysfunction [G89.4] 04/01/2012  . Anxiety [F41.9]   . Depression [F32.9]    . Drug addiction ([place]) [F19.20]    Total Time spent with patient: 20 minutes  Past Psychiatric History: As in H&P   Past Medical History:  Past Medical History:  Diagnosis Date  . Anxiety   . Depression   . Drug addiction ([place])    History reviewed. No pertinent surgical history. Family History: History reviewed. No pertinent family history. Family Psychiatric  History: As in H&P  Social History:  History  Alcohol Use  . 0.6 oz/week  . 1 Cans of beer per week    Comment: PAIN PILLS--PERCOCET/VICODIN     History  Drug Use  . Types: Other-see comments, "Crack" cocaine, Cocaine    Comment: smoking crack for the past week    Social History   Social History  . Marital status: Divorced    Spouse name: N/A  . Number of children: N/A  . Years of education: N/A   Social History Main Topics  . Smoking status: Never Smoker  . Smokeless tobacco: Never Used  . Alcohol use 0.6 oz/week    1 Cans of beer per week     Comment: PAIN PILLS--PERCOCET/VICODIN  . Drug use: Yes    Types: Other-see comments, "Crack" cocaine, Cocaine     Comment: smoking crack for the past week  . Sexual activity: Not Asked     Comment: oxycontin   Other Topics Concern  . None   Social History Narrative  . None   Additional Social History:   Sleep: Poor last night  Appetite:  Better  Current Medications: Current  Facility-Administered Medications  Medication Dose Route Frequency Provider Last Rate Last Dose  . acetaminophen (TYLENOL) tablet 650 mg  650 mg Oral Q6H PRN Lurena Nida, NP      . busPIRone (BUSPAR) tablet 15 mg  15 mg Oral TID Sharma Covert, MD   15 mg at 08/28/16 1314  . gabapentin (NEURONTIN) capsule 400 mg  400 mg Oral TID Sharma Covert, MD   400 mg at 08/28/16 1314  . LORazepam (ATIVAN) tablet 1 mg  1 mg Oral Once PRN Rozetta Nunnery, NP      . mirtazapine (REMERON) tablet 30 mg  30 mg Oral QHS Artist Beach, MD   30 mg at 08/27/16 2128  . QUEtiapine (SEROQUEL)  tablet 300 mg  300 mg Oral QHS Sharma Covert, MD   300 mg at 08/27/16 2128  . sertraline (ZOLOFT) tablet 200 mg  200 mg Oral Daily Artist Beach, MD   200 mg at 08/28/16 0749  . traZODone (DESYREL) tablet 50 mg  50 mg Oral QHS PRN Lurena Nida, NP   50 mg at 08/26/16 2129    Lab Results: No results found for this or any previous visit (from the past 48 hour(s)).  Blood Alcohol level:  Lab Results  Component Value Date   [facility] <5 08/14/2016   ETH <11 123XX123    Metabolic Disorder Labs: Lab Results  Component Value Date   HGBA1C 5.7 (H) 08/17/2016   MPG 117 08/17/2016   MPG 126 (H) 04/02/2012   No results found for: PROLACTIN Lab Results  Component Value Date   CHOL 330 (H) 04/02/2012   TRIG 244 (H) 04/02/2012   HDL 36 (L) 04/02/2012   CHOLHDL 9.2 04/02/2012   VLDL 49 (H) 04/02/2012   LDLCALC 245 (H) 04/02/2012   LDLCALC  08/25/2010    UNABLE TO CALCULATE IF TRIGLYCERIDE OVER 400 mg/dL        Total Cholesterol/HDL:CHD Risk Coronary Heart Disease Risk Table                     Men   Women  1/2 Average Risk   3.4   3.3  Average Risk       5.0   4.4  2 X Average Risk   9.6   7.1  3 X Average Risk  23.4   11.0        Use the calculated Patient Ratio above and the CHD Risk Table to determine the patient's CHD Risk.        ATP III CLASSIFICATION (LDL):  <100     mg/dL   Optimal  100-129  mg/dL   Near or Above                    Optimal  130-159  mg/dL   Borderline  160-189  mg/dL   High  >190     mg/dL   Very High    Physical Findings: AIMS: Facial and Oral Movements Muscles of Facial Expression: None, normal Lips and Perioral Area: None, normal Jaw: None, normal Tongue: None, normal,Extremity Movements Upper (arms, wrists, hands, fingers): None, normal Lower (legs, knees, ankles, toes): None, normal, Trunk Movements Neck, shoulders, hips: None, normal, Overall Severity Severity of abnormal movements (highest score from questions above): None,  normal Incapacitation due to abnormal movements: None, normal Patient's awareness of abnormal movements (rate only patient's report): No Awareness, Dental Status Current problems with teeth and/or dentures?: No  Does patient usually wear dentures?: No  CIWA:  CIWA-Ar Total: 4 COWS:  COWS Total Score: 1  Musculoskeletal: Strength & Muscle Tone: within normal limits Gait & Station: normal Patient leans: N/A  Psychiatric Specialty Exam: Physical Exam  Constitutional: He is oriented to person, place, and time. He appears well-developed.  HENT:  Head: Normocephalic.  Eyes: Pupils are equal, round, and reactive to light.  Neck: Normal range of motion. Neck supple.  Cardiovascular: Normal rate and regular rhythm.   Respiratory: Effort normal and breath sounds normal.  GI: Soft. Bowel sounds are normal.  Musculoskeletal: Normal range of motion.  Neurological: He is alert and oriented to person, place, and time.  Skin: Skin is warm and dry.  Psychiatric:  As above    ROS  Blood pressure 130/79, pulse 72, temperature 98.2 F (36.8 C), temperature source Oral, resp. rate 16, height 5' 7.5" (1.715 m), weight 81.6 kg (180 lb).Body mass index is 27.78 kg/m.  General Appearance:  Neatly dressed. Watching TV prior to interview. Good eye contact. Good rapport  Eye Contact: Good  Speech:  Spontaneous. Normal rate tone and volume.    Volume:  Normal  Mood:  Less depressed  Affect:  Restricted and appropriate  Thought Process:  Goal Directed  Orientation:  Full (Time, Place, and Person)  Thought Content:   No preoccupation with violent thoughts. Negative ruminations about the future.  No hallucination in any modality.    Suicidal Thoughts:  No  Homicidal Thoughts:  No  Memory:  Immediate;   Fair Recent;   Fair Remote;   Fair  Judgement:  Poor  Insight:  Good  Psychomotor Activity:  More engaging  Concentration:  Concentration: Fair and Attention Span: Fair  Recall:  [employer] of  Knowledge:  Fair  Language:  Good  Akathisia:  No  Handed:    AIMS (if indicated):     Assets:  Communication Skills  ADL's:  Impaired  Cognition:  WNL  Sleep:  Number of Hours: 6     Treatment Plan Summary: Patient is tolerating recent medication adjustment well. Depression is gradually lifting.  No suicidal thoughts. `  Psychiatric: MDD recurrent   Medical:  Psychosocial:  Unemployed Homeless Financial constraints   PLAN: 1. Continue current regimen 2. Continue to monitor mood, behavior and interaction with peers. 3. Continue to encourage grooming and activation.    Artist Beach, MD 08/28/2016, 2:52 PMPatient ID: Iris Pert, male   DOB: [birth year], 66 y.o.   MRN: [id number] Patient ID: TYMAR PULGARIN, male   DOB: 13-Sep-1950, 66 y.o.   MRN: [id number] Patient ID: JAMARUS DECLARK, male   DOB: 04-23-1951, 66 y.o.   MRN: [id number] Patient ID: ADIEN BACHO, male   DOB: Jun 26, 1951, 66 y.o.   MRN: [id number]

## 2016-08-28 NOTE — Progress Notes (Signed)
Nursing Note 08/28/2016 [id number]  Data Reports sleeping "better" last night. Refused to complete self-inventory. Affect sad/flat. Denies HI, SI, AVH.  Patient very minimal, but spends free time in day area.  Does not share, whenever asked questions like can I help you says "I wish you could."   Action Spoke with patient 1:1, nurse offered support to patient throughout shift.  Continues to be monitored on 15 minute checks for safety.  Response Remains safe on unit, though minimal.

## 2016-08-29 NOTE — Progress Notes (Signed)
Patient ID: Frank [name], male   DOB: 03/06/1951, 66 y.o.   MRN: [id number]   Pt attended and engaged in evening AA group.

## 2016-08-29 NOTE — Progress Notes (Signed)
D.  Pt pleasant but anxious on approach, did not attend evening AA group.  Minimal interaction on unit.  Pt states he did sleep last night but still not feeling that well.  Pt denies SI/HI/hallucinations at this time.  A.  Support and encouragement offered, medication given as ordered  R.  Pt remains safe on the unit, will continue to monitor.

## 2016-08-29 NOTE — Plan of Care (Signed)
Problem: Medication: Goal: Compliance with prescribed medication regimen will improve Outcome: Progressing Pt has been compliant with medication regimen   

## 2016-08-29 NOTE — BHH Group Notes (Signed)
[place] Group Notes:  (Nursing/MHT/Case Management/Adjunct)  Date:  08/29/2016  Time:  3:26 PM  Type of Therapy:  Nurse Education  Participation Level:  Active  Participation Quality:  Appropriate  Affect:  Appropriate  Cognitive:  Appropriate  Insight:  Appropriate  Engagement in Group:  Engaged  Modes of Intervention:  Discussion and Education  Summary of Progress/Problems:  During group self care was discussed, including how to incorporate essential oils.    Cheri Kearns 08/29/2016, 3:26 PM

## 2016-08-29 NOTE — Progress Notes (Signed)
[facility] MD Progress Note  08/29/2016 9:40 AM Frank [name]  MRN:  [id number] Subjective:   66 yo Caucasian male, single, lives with a roommate, unemployed. Long history of depression. Presented to the ER in company of his brother and girlfriend. Family concerned about him. Reported to have been feeling very down in the past couple of weeks. Not taking care of himself. Withdrawn and stays in his room all the time. Not eating and not sleeping. Asked his brother to shoot him. Reports a lot of financial stressors. Has been coping poorly and resorted to alcohol. BAL, UDS and routine labs were essentially normal at presentation.   Seen today. Patient states that he has more energy. He did not have breakfast because he was sleeping. Says he is starving now and looks forward to launch. He still gets anxious when he thinks about his situation. Says he feels trapped. Suicidal thoughts comes in at times but he is able to take away from his mind. Says he does not consider suicide an option. No associated psychosis. No evidence of mania. Yet to shave his beard. Says he would ike to do it soon.   Nursing staff report that he is coming out more but stays to self. He reports anxiety when probed. He reports being worried about his issues. He reports not feeling suicidal. He does not report any evidence of psychosis. He has not been observed to be hallucinating. He has been tolerating his medications well. No behavioral issues.  Principal Problem:   MDD severe without psychotic features. Diagnosis:   Patient Active Problem List   Diagnosis Date Noted  . MDD (major depressive disorder), recurrent severe, without psychosis ([place]) [F33.2] 08/15/2016  . MDD (major depressive disorder) [F32.9] 05/20/2012  . Cocaine abuse [F14.10] 05/20/2012    Class: Chronic  . Chronic pain associated with significant psychosocial dysfunction [G89.4] 04/01/2012  . Anxiety [F41.9]   . Depression [F32.9]   . Drug addiction ([place]) [F19.20]     Total Time spent with patient: 20 minutes  Past Psychiatric History: As in H&P   Past Medical History:  Past Medical History:  Diagnosis Date  . Anxiety   . Depression   . Drug addiction ([place])    History reviewed. No pertinent surgical history. Family History: History reviewed. No pertinent family history. Family Psychiatric  History: As in H&P  Social History:  History  Alcohol Use  . 0.6 oz/week  . 1 Cans of beer per week    Comment: PAIN PILLS--PERCOCET/VICODIN     History  Drug Use  . Types: Other-see comments, "Crack" cocaine, Cocaine    Comment: smoking crack for the past week    Social History   Social History  . Marital status: Divorced    Spouse name: N/A  . Number of children: N/A  . Years of education: N/A   Social History Main Topics  . Smoking status: Never Smoker  . Smokeless tobacco: Never Used  . Alcohol use 0.6 oz/week    1 Cans of beer per week     Comment: PAIN PILLS--PERCOCET/VICODIN  . Drug use: Yes    Types: Other-see comments, "Crack" cocaine, Cocaine     Comment: smoking crack for the past week  . Sexual activity: Not Asked     Comment: oxycontin   Other Topics Concern  . None   Social History Narrative  . None   Additional Social History:   Sleep: Good  Appetite:  Better  Current Medications: Current Facility-Administered Medications  Medication Dose Route Frequency Provider Last Rate Last Dose  . acetaminophen (TYLENOL) tablet 650 mg  650 mg Oral Q6H PRN Lurena Nida, NP      . busPIRone (BUSPAR) tablet 15 mg  15 mg Oral TID Sharma Covert, MD   15 mg at 08/29/16 0939  . gabapentin (NEURONTIN) capsule 400 mg  400 mg Oral TID Sharma Covert, MD   400 mg at 08/29/16 0939  . LORazepam (ATIVAN) tablet 1 mg  1 mg Oral Once PRN Rozetta Nunnery, NP      . mirtazapine (REMERON) tablet 30 mg  30 mg Oral QHS Artist Beach, MD   30 mg at 08/28/16 2136  . QUEtiapine (SEROQUEL) tablet 300 mg  300 mg Oral QHS Sharma Covert, MD   300 mg at 08/28/16 2137  . sertraline (ZOLOFT) tablet 200 mg  200 mg Oral Daily Artist Beach, MD   200 mg at 08/29/16 0939  . traZODone (DESYREL) tablet 50 mg  50 mg Oral QHS PRN Lurena Nida, NP   50 mg at 08/28/16 2136    Lab Results: No results found for this or any previous visit (from the past 48 hour(s)).  Blood Alcohol level:  Lab Results  Component Value Date   [facility] <5 08/14/2016   ETH <11 123XX123    Metabolic Disorder Labs: Lab Results  Component Value Date   HGBA1C 5.7 (H) 08/17/2016   MPG 117 08/17/2016   MPG 126 (H) 04/02/2012   No results found for: PROLACTIN Lab Results  Component Value Date   CHOL 330 (H) 04/02/2012   TRIG 244 (H) 04/02/2012   HDL 36 (L) 04/02/2012   CHOLHDL 9.2 04/02/2012   VLDL 49 (H) 04/02/2012   LDLCALC 245 (H) 04/02/2012   LDLCALC  08/25/2010    UNABLE TO CALCULATE IF TRIGLYCERIDE OVER 400 mg/dL        Total Cholesterol/HDL:CHD Risk Coronary Heart Disease Risk Table                     Men   Women  1/2 Average Risk   3.4   3.3  Average Risk       5.0   4.4  2 X Average Risk   9.6   7.1  3 X Average Risk  23.4   11.0        Use the calculated Patient Ratio above and the CHD Risk Table to determine the patient's CHD Risk.        ATP III CLASSIFICATION (LDL):  <100     mg/dL   Optimal  100-129  mg/dL   Near or Above                    Optimal  130-159  mg/dL   Borderline  160-189  mg/dL   High  >190     mg/dL   Very High    Physical Findings: AIMS: Facial and Oral Movements Muscles of Facial Expression: None, normal Lips and Perioral Area: None, normal Jaw: None, normal Tongue: None, normal,Extremity Movements Upper (arms, wrists, hands, fingers): None, normal Lower (legs, knees, ankles, toes): None, normal, Trunk Movements Neck, shoulders, hips: None, normal, Overall Severity Severity of abnormal movements (highest score from questions above): None, normal Incapacitation due to abnormal movements:  None, normal Patient's awareness of abnormal movements (rate only patient's report): No Awareness, Dental Status Current problems with teeth and/or dentures?: No Does patient usually  wear dentures?: No  CIWA:  CIWA-Ar Total: 4 COWS:  COWS Total Score: 1  Musculoskeletal: Strength & Muscle Tone: within normal limits Gait & Station: normal Patient leans: N/A  Psychiatric Specialty Exam: Physical Exam  Constitutional: He is oriented to person, place, and time. He appears well-developed.  HENT:  Head: Normocephalic.  Eyes: Pupils are equal, round, and reactive to light.  Neck: Normal range of motion. Neck supple.  Cardiovascular: Normal rate and regular rhythm.   Respiratory: Effort normal and breath sounds normal.  GI: Soft. Bowel sounds are normal.  Musculoskeletal: Normal range of motion.  Neurological: He is alert and oriented to person, place, and time.  Skin: Skin is warm and dry.  Psychiatric:  As above    ROS  Blood pressure 119/79, pulse 89, temperature 97.8 F (36.6 C), temperature source Oral, resp. rate 16, height 5' 7.5" (1.715 m), weight 81.6 kg (180 lb).Body mass index is 27.78 kg/m.  General Appearance:  Neatly dressed. Cooperative but a bit tense.  Good rapport  Eye Contact: Good  Speech:  Spontaneous. Normal rate tone and volume.    Volume:  Normal  Mood:  Less depressed  Affect:  Restricted and appropriate  Thought Process:  Goal Directed  Orientation:  Full (Time, Place, and Person)  Thought Content:   No preoccupation with violent thoughts. Negative ruminations about the future.  No hallucination in any modality.    Suicidal Thoughts:  Off and on. No intent  Homicidal Thoughts:  No  Memory:  Immediate;   Fair Recent;   Fair Remote;   Fair  Judgement:  Poor  Insight:  Good  Psychomotor Activity:  More engaging  Concentration:  Concentration: Fair and Attention Span: Fair  Recall:  [employer] of Knowledge:  Fair  Language:  Good  Akathisia:  No   Handed:    AIMS (if indicated):     Assets:  Communication Skills  ADL's:  Impaired  Cognition:  WNL  Sleep:  Number of Hours: 5.5     Treatment Plan Summary: Patient is tolerating recent medication adjustment well. Depression is gradually lifting.  We are still evaluating him.   Psychiatric: MDD recurrent   Medical:  Psychosocial:  Unemployed Homeless Financial constraints   PLAN: 1. Continue current regimen 2. Continue to monitor mood, behavior and interaction with peers. 3. Continue to encourage grooming and activation.    Artist Beach, MD 08/29/2016, 9:40 AMPatient ID: Frank [name], male   DOB: 02/16/1951, 66 y.o.   MRN: [id number] Patient ID: Frank [name], male   DOB: 1950/12/08, 66 y.o.   MRN: [id number] Patient ID: Frank [name], male   DOB: [birth year], 66 y.o.   MRN: [id number] Patient ID: Frank [name], male   DOB: 1951/05/28, 66 y.o.   MRN: [id number] Patient ID: Frank [name], male   DOB: Nov 11, 1950, 66 y.o.   MRN: [id number]

## 2016-08-29 NOTE — BHH Group Notes (Signed)
[place] LCSW Group Therapy Note   08/29/2016 at 10 AM   Type of Therapy and Topic: Group Therapy: Feelings Around Returning Home & Establishing a Supportive Framework and Activity to Identify signs of Improvement or Decompensation   Participation Level: None   Description of Group:  Patients first processed thoughts and feelings about up coming discharge. These included fears of upcoming changes, lack of change, new living environments, judgements and expectations from others and overall stigma of MH issues. We then discussed what is a supportive framework? What does it look like feel like and how do I discern it from and unhealthy non-supportive network? Learn how to cope when supports are not helpful and don't support you. Discuss what to do when your family/friends are not supportive.   Therapeutic Goals Addressed in Processing Group:  1. Patient will identify one healthy supportive network that they can use at discharge. 2. Patient will identify one factor of a supportive framework and how to tell it from an unhealthy network. 3. Patient able to identify one coping skill to use when they do not have positive supports from others. 4. Patient will demonstrate ability to communicate their needs through discussion and/or role plays.  Summary of Patient Progress:  Pt did not engage during group session although he was present in dayroom throughout session. As patients processed their anxiety about discharge and described healthy supports patient  Remained quiet with eyes downcast.  Patient did not participate in group activity as he felt it too overwhelming a task.   Sheilah Pigeon, LCSW

## 2016-08-29 NOTE — BHH Group Notes (Signed)
[place] Group Notes:  (Nursing/MHT/Case Management/Adjunct)  Date:  08/29/2016  Time:  3:22 PM  Type of Therapy:  Nurse Education  Participation Level:  Minimal  Participation Quality:  Attentive  Affect:  Blunted  Cognitive:  Alert and Oriented  Insight:  Appropriate  Engagement in Group:  Engaged  Modes of Intervention:  Discussion and Education  Summary of Progress/Problems: Group focused on self care, including how to incorporate essential oils.  [name] 08/29/2016, 3:22 PM

## 2016-08-29 NOTE — Progress Notes (Signed)
D.  Pt pleasant but anxious on approach, continues to have difficulty with sleep.  Pt was positive for evening AA group, minimal interaction on the unit.  Pt denies SI/HI/hallucinations at this time.  A.  Support and encouragement offered, medication given as ordered for sleep.  Pt made aware of available prn if he wakes up and can not go back to sleep.  R.  Pt remains safe on the unit, will continue to monitor.

## 2016-08-30 ENCOUNTER — Encounter (HOSPITAL_COMMUNITY): Payer: Self-pay | Admitting: Psychiatry

## 2016-08-30 NOTE — Progress Notes (Signed)
Recreation Therapy Notes  Date: 08/30/16 Time: 0930 Location: 300 [address] Dayroom  Group Topic: Stress Management  Goal Area(s) Addresses:  Patient will verbalize importance of using healthy stress management.  Patient will identify positive emotions associated with healthy stress management.   Behavioral Response: Engaged  Intervention: Stress Management  Activity :  Guided Visualization.  LRT introduced the stress management technique of guided visualization.  LRT read a script to allow patient to follow along and engage in the activity.  Patients were to follow along at LRT read script to engage in the activity.  Education:  Stress Management, Discharge Planning.   Education Outcome: Acknowledges edcuation/In group clarification offered/Needs additional education  Clinical Observations/Feedback: Pt attended group.    Victorino Sparrow, LRT/CTRS         Victorino Sparrow A 08/30/2016 12:45 PM

## 2016-08-30 NOTE — Progress Notes (Signed)
D: Patient minimizes his reasons for being here.  He states, "I had a panic attack that lasted about 2 weeks.  I'm not sure why I'm on this hall.  Yes, I drink, but that's not the issue.  The issue is my anxiety.  I wasn't even able to talk when I first got here.  Now I see what a mess I've made."  Patient is unsure where he will discharge to.  He denies any thoughts of self harm.  Patient denies any withdrawal symptoms.  Patient is somewhat tearful when speaking; his mood is depressed.  He rates his depression as a 5; hopelessness and anxiety as a 7.  His goal today is to "figure out where to live."  A: Continue to monitor medication management and MD orders.  Safety checks completed every 15 minutes per protocol.  Offer support and encouragement as needed. R: Patient is receptive to staff; his behavior is appropriate.

## 2016-08-30 NOTE — [facility] Group Notes (Signed)
Pt attended spiritual care group on grief and loss facilitated by chaplain Jerene Pitch   Group opened with brief discussion and psycho-social ed around grief and loss in relationships and in relation to self - identifying life patterns, circumstances, changes that cause losses. Established group norm of speaking from own life experience. Group goal of establishing open and affirming space for members to share loss and experience with grief, normalize grief experience and provide psycho social education and grief support.    Frank [name] was present throughout group.  He was attentive, but did not engage in group discussion.

## 2016-08-30 NOTE — Progress Notes (Signed)
Nursing Progress Note: 7p-7a D: Pt currently presents with a agitated/anxious affect and behavior. Pt states "I need ativan to help me sleep. I don't know how I'm gonna sleep without it." Interacting appropriately with milieu. Pt reports ok sleep with current medication regimen.   A: Pt provided with medications per providers orders. Pt's labs and vitals were monitored throughout the night. Pt supported emotionally and encouraged to express concerns and questions. Pt educated on medications.  R: Pt's safety ensured with 15 minute and environmental checks. Pt currently denies SI/HI/Self Harm and AVH. Pt verbally contracts to seek staff if SI/HI or A/VH occurs and to consult with staff before acting on any harmful thoughts. Will continue to monitor.

## 2016-08-30 NOTE — [facility] Group Notes (Signed)
[facility] LCSW Group Therapy  08/30/2016 11:33 AM  Type of Therapy:  Group Therapy  Participation Level:  Active  Participation Quality:  Attentive  Affect:  Appropriate  Cognitive:  Alert and Oriented  Insight:  Improving  Engagement in Therapy:  Improving  Modes of Intervention:  Confrontation, Discussion, Education, Problem-solving, Socialization and Support  Summary of Progress/Problems: Today's Topic: Overcoming Obstacles. Patients identified one short term goal and potential obstacles in reaching this goal. Patients processed barriers involved in overcoming these obstacles. Patients identified steps necessary for overcoming these obstacles and explored motivation (internal and external) for facing these difficulties head on.   Krystiana Fornes N Smart LCSW 08/30/2016, 11:33 AM

## 2016-08-30 NOTE — Progress Notes (Signed)
[facility] MD Progress Note  08/30/2016 12:11 PM Frank [name]  MRN:  [id number] Subjective:   66 yo Caucasian male, single, lives with a roommate, unemployed. Long history of depression. Presented to the ER in company of his brother and girlfriend. Family concerned about him. Reported to have been feeling very down in the past couple of weeks. Not taking care of himself. Withdrawn and stays in his room all the time. Not eating and not sleeping. Asked his brother to shoot him. Reports a lot of financial stressors. Has been coping poorly and resorted to alcohol. BAL, UDS and routine labs were essentially normal at presentation.   On 3/4 patient states that he has more energy. He did not have breakfast because he was sleeping. Says he is starving now and looks forward to launch. He still gets anxious when he thinks about his situation. Says he feels trapped. Suicidal thoughts comes in at times but he is able to take away from his mind. Says he does not consider suicide an option. No associated psychosis. No evidence of mania. Yet to shave his beard. Says he would ike to do it soon.   Nursing staff report that he is coming out more but stays to self. He reports anxiety when probed. He reports being worried about his issues. He reports not feeling suicidal. He does not report any evidence of psychosis. He has not been observed to be hallucinating. He has been tolerating his medications well. No behavioral issues.  On 3/5 he is seen and examined. I last saw the patient approximately 1 week ago. He does appear much better. He is speaking more, attending groups, taking his medication. He still complains of some mild dizziness. He talks about the fact he has not spoken to his family, friends or landlord since he was admitted. He feels a great deal of anxiety over this. We discussed the fact that the unknown is more frightening than the known. He denied any Si, A/V hallucinations.  Principal Problem:   MDD severe without  psychotic features. Diagnosis:   Patient Active Problem List   Diagnosis Date Noted  . MDD (major depressive disorder), recurrent severe, without psychosis ([place]) [F33.2] 08/15/2016  . MDD (major depressive disorder) [F32.9] 05/20/2012  . Cocaine abuse [F14.10] 05/20/2012    Class: Chronic  . Chronic pain associated with significant psychosocial dysfunction [G89.4] 04/01/2012  . Anxiety [F41.9]   . Depression [F32.9]   . Drug addiction ([place]) [F19.20]    Total Time spent with patient: 20 minutes  Past Psychiatric History: As in H&P   Past Medical History:  Past Medical History:  Diagnosis Date  . Anxiety   . Depression   . Drug addiction ([place])    History reviewed. No pertinent surgical history. Family History: History reviewed. No pertinent family history. Family Psychiatric  History: As in H&P  Social History:  History  Alcohol Use  . 0.6 oz/week  . 1 Cans of beer per week    Comment: PAIN PILLS--PERCOCET/VICODIN     History  Drug Use  . Types: Other-see comments, "Crack" cocaine, Cocaine    Comment: smoking crack for the past week    Social History   Social History  . Marital status: Divorced    Spouse name: N/A  . Number of children: N/A  . Years of education: N/A   Social History Main Topics  . Smoking status: Never Smoker  . Smokeless tobacco: Never Used  . Alcohol use 0.6 oz/week    1  Cans of beer per week     Comment: PAIN PILLS--PERCOCET/VICODIN  . Drug use: Yes    Types: Other-see comments, "Crack" cocaine, Cocaine     Comment: smoking crack for the past week  . Sexual activity: Not Asked     Comment: oxycontin   Other Topics Concern  . None   Social History Narrative  . None   Additional Social History:   Sleep: Good  Appetite:  Better  Current Medications: Current Facility-Administered Medications  Medication Dose Route Frequency Provider Last Rate Last Dose  . acetaminophen (TYLENOL) tablet 650 mg  650 mg Oral Q6H PRN Lurena Nida,  NP      . busPIRone (BUSPAR) tablet 15 mg  15 mg Oral TID Sharma Covert, MD   15 mg at 08/30/16 1152  . gabapentin (NEURONTIN) capsule 400 mg  400 mg Oral TID Sharma Covert, MD   400 mg at 08/30/16 1152  . mirtazapine (REMERON) tablet 30 mg  30 mg Oral QHS Artist Beach, MD   30 mg at 08/29/16 2132  . QUEtiapine (SEROQUEL) tablet 300 mg  300 mg Oral QHS Sharma Covert, MD   300 mg at 08/29/16 2132  . sertraline (ZOLOFT) tablet 200 mg  200 mg Oral Daily Artist Beach, MD   200 mg at 08/30/16 0806  . traZODone (DESYREL) tablet 50 mg  50 mg Oral QHS PRN Lurena Nida, NP   50 mg at 08/29/16 2132    Lab Results: No results found for this or any previous visit (from the past 48 hour(s)).  Blood Alcohol level:  Lab Results  Component Value Date   [facility] <5 08/14/2016   ETH <11 123XX123    Metabolic Disorder Labs: Lab Results  Component Value Date   HGBA1C 5.7 (H) 08/17/2016   MPG 117 08/17/2016   MPG 126 (H) 04/02/2012   No results found for: PROLACTIN Lab Results  Component Value Date   CHOL 330 (H) 04/02/2012   TRIG 244 (H) 04/02/2012   HDL 36 (L) 04/02/2012   CHOLHDL 9.2 04/02/2012   VLDL 49 (H) 04/02/2012   LDLCALC 245 (H) 04/02/2012   LDLCALC  08/25/2010    UNABLE TO CALCULATE IF TRIGLYCERIDE OVER 400 mg/dL        Total Cholesterol/HDL:CHD Risk Coronary Heart Disease Risk Table                     Men   Women  1/2 Average Risk   3.4   3.3  Average Risk       5.0   4.4  2 X Average Risk   9.6   7.1  3 X Average Risk  23.4   11.0        Use the calculated Patient Ratio above and the CHD Risk Table to determine the patient's CHD Risk.        ATP III CLASSIFICATION (LDL):  <100     mg/dL   Optimal  100-129  mg/dL   Near or Above                    Optimal  130-159  mg/dL   Borderline  160-189  mg/dL   High  >190     mg/dL   Very High    Physical Findings: AIMS: Facial and Oral Movements Muscles of Facial Expression: None, normal Lips and  Perioral Area: None, normal Jaw: None, normal Tongue: None, normal,Extremity Movements  Upper (arms, wrists, hands, fingers): None, normal Lower (legs, knees, ankles, toes): None, normal, Trunk Movements Neck, shoulders, hips: None, normal, Overall Severity Severity of abnormal movements (highest score from questions above): None, normal Incapacitation due to abnormal movements: None, normal Patient's awareness of abnormal movements (rate only patient's report): No Awareness, Dental Status Current problems with teeth and/or dentures?: No Does patient usually wear dentures?: No  CIWA:  CIWA-Ar Total: 4 COWS:  COWS Total Score: 1  Musculoskeletal: Strength & Muscle Tone: within normal limits Gait & Station: normal Patient leans: N/A  Psychiatric Specialty Exam: Physical Exam  Nursing note and vitals reviewed. Constitutional: He is oriented to person, place, and time. He appears well-developed.  HENT:  Head: Normocephalic.  Eyes: Pupils are equal, round, and reactive to light.  Neck: Normal range of motion. Neck supple.  Cardiovascular: Normal rate and regular rhythm.   Respiratory: Effort normal and breath sounds normal.  GI: Soft. Bowel sounds are normal.  Musculoskeletal: Normal range of motion.  Neurological: He is alert and oriented to person, place, and time.  Skin: Skin is warm and dry.  Psychiatric:  As above    ROS  Blood pressure 107/76, pulse 95, temperature 97.3 F (36.3 C), temperature source Oral, resp. rate 16, height 5' 7.5" (1.715 m), weight 81.6 kg (180 lb).Body mass index is 27.78 kg/m.  General Appearance:  Neatly dressed. Cooperative but still anxious.  Good rapport  Eye Contact: Good  Speech:  Spontaneous. Normal rate tone and volume.    Volume:  Normal  Mood:  Less depressed  Affect:  Restricted and appropriate  Thought Process:  Goal Directed  Orientation:  Full (Time, Place, and Person)  Thought Content:   No preoccupation with violent thoughts.  Negative ruminations about the future.  No hallucination in any modality.    Suicidal Thoughts:  Off and on. No intent  Homicidal Thoughts:  No  Memory:  Immediate;   Fair Recent;   Fair Remote;   Fair  Judgement:  Poor  Insight:  Good  Psychomotor Activity:  More engaging  Concentration:  Concentration: Fair and Attention Span: Fair  Recall:  [employer] of Knowledge:  Fair  Language:  Good  Akathisia:  No  Handed:    AIMS (if indicated):     Assets:  Communication Skills  ADL's:  Impaired  Cognition:  WNL  Sleep:  Number of Hours: 6.15     Treatment Plan Summary: Patient is tolerating recent medication adjustment well. Depression and anxiety are slowly improving.    Psychiatric: MDD recurrent   Medical:  Psychosocial:  Unemployed Homeless Financial constraints   PLAN: 1. Continue current regimen 2. Assess plan for transfer to [facility] 3. Continue to monitor mood, behavior and interaction with peers. 4. Continue to encourage grooming and activation. 5. no change in current medications 6. Psychosocial issues need to be addressed by patient    Sharma Covert, MD 08/30/2016, 12:11 PMPatient ID: Iris Pert, male   DOB: Sep 23, 1950, 66 y.o.   MRN: [id number] Patient ID: ISAAC RADILLO, male   DOB: June 03, 1951, 66 y.o.   MRN: [id number] Patient ID: DERYK PFOST, male   DOB: Nov 02, 1950, 66 y.o.   MRN: [id number] Patient ID: ALISON GLAS, male   DOB: 06/22/1951, 66 y.o.   MRN: [id number] Patient ID: TREYE LUDINGTON, male   DOB: 1951-02-18, 66 y.o.   MRN: [id number] Patient ID: TYWAN WAWRO, male   DOB: [birth year], 66 y.o.   MRN: [id number]

## 2016-08-30 NOTE — Progress Notes (Signed)
Adult Psychoeducational Group Note  Date:  08/30/2016 Time:  10:42 PM  Group Topic/Focus:  Wrap-Up Group:   The focus of this group is to help patients review their daily goal of treatment and discuss progress on daily workbooks.  Participation Level:  Active  Participation Quality:  Attentive  Affect:  Appropriate  Cognitive:  Appropriate  Insight: Good  Engagement in Group:  Engaged  Modes of Intervention:  Activity  Additional Comments:  Patient rated his day a 4. His goal was attend all groups today and socialize and he felt he met this goal.  Donato Heinz 08/30/2016, 10:42 PM

## 2016-08-31 MED ORDER — QUETIAPINE FUMARATE 400 MG PO TABS
400.0000 mg | ORAL_TABLET | Freq: Every day | ORAL | Status: DC
  Administered 2016-08-31: 400 mg via ORAL
  Filled 2016-08-31 (×2): qty 1

## 2016-08-31 NOTE — Progress Notes (Signed)
[facility] MD Progress Note  08/31/2016 11:34 AM Frank [name]  MRN:  [id number] Subjective:   66 yo Caucasian male, single, lives with a roommate, unemployed. Long history of depression. Presented to the ER in company of his brother and girlfriend. Family concerned about him. Reported to have been feeling very down in the past couple of weeks. Not taking care of himself. Withdrawn and stays in his room all the time. Not eating and not sleeping. Asked his brother to shoot him. Reports a lot of financial stressors. Has been coping poorly and resorted to alcohol. BAL, UDS and routine labs were essentially normal at presentation.   On 3/4 patient states that he has more energy. He did not have breakfast because he was sleeping. Says he is starving now and looks forward to launch. He still gets anxious when he thinks about his situation. Says he feels trapped. Suicidal thoughts comes in at times but he is able to take away from his mind. Says he does not consider suicide an option. No associated psychosis. No evidence of mania. Yet to shave his beard. Says he would ike to do it soon.   Nursing staff report that he is coming out more but stays to self. He reports anxiety when probed. He reports being worried about his issues. He reports not feeling suicidal. He does not report any evidence of psychosis. He has not been observed to be hallucinating. He has been tolerating his medications well. No behavioral issues.  On 3/5 he is seen and examined. I last saw the patient approximately 1 week ago. He does appear much better. He is speaking more, attending groups, taking his medication. He still complains of some mild dizziness. He talks about the fact he has not spoken to his family, friends or landlord since he was admitted. He feels a great deal of anxiety over this. We discussed the fact that the unknown is more frightening than the known. He denied any Si, A/V hallucinations.  On 3/6 patient is seen and examined, key  elements reviewed. Apparently patient was "upset" last night when ativan had been stopped. This was discussed. We discussed increasing his seroquel to help his sleep. Still avoiding talking to family about housing etc. Discussed anxiety provoking by procrastinating. No psychotic symptoms, discussed potential after care options.  Principal Problem:   MDD severe without psychotic features. Diagnosis:   Patient Active Problem List   Diagnosis Date Noted  . MDD (major depressive disorder), recurrent severe, without psychosis ([place]) [F33.2] 08/15/2016  . MDD (major depressive disorder) [F32.9] 05/20/2012  . Cocaine abuse [F14.10] 05/20/2012    Class: Chronic  . Chronic pain associated with significant psychosocial dysfunction [G89.4] 04/01/2012  . Anxiety [F41.9]   . Depression [F32.9]   . Drug addiction ([place]) [F19.20]    Total Time spent with patient: 20 minutes  Past Psychiatric History: As in H&P   Past Medical History:  Past Medical History:  Diagnosis Date  . Anxiety   . Depression   . Drug addiction ([place])    History reviewed. No pertinent surgical history. Family History: History reviewed. No pertinent family history. Family Psychiatric  History: As in H&P  Social History:  History  Alcohol Use  . 0.6 oz/week  . 1 Cans of beer per week    Comment: PAIN PILLS--PERCOCET/VICODIN     History  Drug Use  . Types: Other-see comments, "Crack" cocaine, Cocaine    Comment: smoking crack for the past week    Social History  Social History  . Marital status: Divorced    Spouse name: N/A  . Number of children: N/A  . Years of education: N/A   Social History Main Topics  . Smoking status: Never Smoker  . Smokeless tobacco: Never Used  . Alcohol use 0.6 oz/week    1 Cans of beer per week     Comment: PAIN PILLS--PERCOCET/VICODIN  . Drug use: Yes    Types: Other-see comments, "Crack" cocaine, Cocaine     Comment: smoking crack for the past week  . Sexual activity: Not  Asked     Comment: oxycontin   Other Topics Concern  . None   Social History Narrative  . None   Additional Social History:   Sleep: Good  Appetite:  Better  Current Medications: Current Facility-Administered Medications  Medication Dose Route Frequency Provider Last Rate Last Dose  . acetaminophen (TYLENOL) tablet 650 mg  650 mg Oral Q6H PRN Lurena Nida, NP      . busPIRone (BUSPAR) tablet 15 mg  15 mg Oral TID Sharma Covert, MD   15 mg at 08/31/16 0752  . gabapentin (NEURONTIN) capsule 400 mg  400 mg Oral TID Sharma Covert, MD   400 mg at 08/31/16 0752  . mirtazapine (REMERON) tablet 30 mg  30 mg Oral QHS Artist Beach, MD   30 mg at 08/30/16 2139  . QUEtiapine (SEROQUEL) tablet 400 mg  400 mg Oral QHS Sharma Covert, MD      . sertraline (ZOLOFT) tablet 200 mg  200 mg Oral Daily Artist Beach, MD   200 mg at 08/31/16 0752  . traZODone (DESYREL) tablet 50 mg  50 mg Oral QHS PRN Lurena Nida, NP   50 mg at 08/30/16 2139    Lab Results: No results found for this or any previous visit (from the past 48 hour(s)).  Blood Alcohol level:  Lab Results  Component Value Date   [facility] <5 08/14/2016   ETH <11 123XX123    Metabolic Disorder Labs: Lab Results  Component Value Date   HGBA1C 5.7 (H) 08/17/2016   MPG 117 08/17/2016   MPG 126 (H) 04/02/2012   No results found for: PROLACTIN Lab Results  Component Value Date   CHOL 330 (H) 04/02/2012   TRIG 244 (H) 04/02/2012   HDL 36 (L) 04/02/2012   CHOLHDL 9.2 04/02/2012   VLDL 49 (H) 04/02/2012   LDLCALC 245 (H) 04/02/2012   LDLCALC  08/25/2010    UNABLE TO CALCULATE IF TRIGLYCERIDE OVER 400 mg/dL        Total Cholesterol/HDL:CHD Risk Coronary Heart Disease Risk Table                     Men   Women  1/2 Average Risk   3.4   3.3  Average Risk       5.0   4.4  2 X Average Risk   9.6   7.1  3 X Average Risk  23.4   11.0        Use the calculated Patient Ratio above and the CHD Risk Table to  determine the patient's CHD Risk.        ATP III CLASSIFICATION (LDL):  <100     mg/dL   Optimal  100-129  mg/dL   Near or Above                    Optimal  130-159  mg/dL  Borderline  160-189  mg/dL   High  >190     mg/dL   Very High    Physical Findings: AIMS: Facial and Oral Movements Muscles of Facial Expression: None, normal Lips and Perioral Area: None, normal Jaw: None, normal Tongue: None, normal,Extremity Movements Upper (arms, wrists, hands, fingers): None, normal Lower (legs, knees, ankles, toes): None, normal, Trunk Movements Neck, shoulders, hips: None, normal, Overall Severity Severity of abnormal movements (highest score from questions above): None, normal Incapacitation due to abnormal movements: None, normal Patient's awareness of abnormal movements (rate only patient's report): No Awareness, Dental Status Current problems with teeth and/or dentures?: No Does patient usually wear dentures?: No  CIWA:  CIWA-Ar Total: 4 COWS:  COWS Total Score: 1  Musculoskeletal: Strength & Muscle Tone: within normal limits Gait & Station: normal Patient leans: N/A  Psychiatric Specialty Exam: Physical Exam  Nursing note and vitals reviewed. Constitutional: He is oriented to person, place, and time. He appears well-developed.  HENT:  Head: Normocephalic.  Eyes: Pupils are equal, round, and reactive to light.  Neck: Normal range of motion. Neck supple.  Cardiovascular: Normal rate and regular rhythm.   Respiratory: Effort normal and breath sounds normal.  GI: Soft. Bowel sounds are normal.  Musculoskeletal: Normal range of motion.  Neurological: He is alert and oriented to person, place, and time.  Skin: Skin is warm and dry.  Psychiatric:  As above    ROS  Blood pressure 106/67, pulse 83, temperature 98.6 F (37 C), temperature source Oral, resp. rate 18, height 5' 7.5" (1.715 m), weight 81.6 kg (180 lb).Body mass index is 27.78 kg/m.  General Appearance:  Neatly  dressed. Cooperative but still anxious.  Good rapport  Eye Contact: Good  Speech:  Spontaneous. Normal rate tone and volume.    Volume:  Normal  Mood:  Less depressed  Affect:  Restricted and appropriate  Thought Process:  Goal Directed  Orientation:  Full (Time, Place, and Person)  Thought Content:   No preoccupation with violent thoughts. Negative ruminations about the future.  No hallucination in any modality.    Suicidal Thoughts:  Off and on. No intent  Homicidal Thoughts:  No  Memory:  Immediate;   Fair Recent;   Fair Remote;   Fair  Judgement:  Poor  Insight:  Good  Psychomotor Activity:  More engaging  Concentration:  Concentration: Fair and Attention Span: Fair  Recall:  [employer] of Knowledge:  Fair  Language:  Good  Akathisia:  No  Handed:    AIMS (if indicated):     Assets:  Communication Skills  ADL's:  Impaired  Cognition:  WNL  Sleep:  Number of Hours: 6.25     Treatment Plan Summary: Patient is tolerating recent medication adjustment well. Depression and anxiety are slowly improving.    Psychiatric: MDD recurrent   Medical:  Psychosocial:  Unemployed Homeless Financial constraints   PLAN: 1. Increase seroquel to 400 mg po q hs 2. Assess plan for transfer to [facility] 3. Continue to monitor mood, behavior and interaction with peers. 4. Continue to encourage grooming and activation. 5. Psychosocial issues need to be addressed by patient such as housing, contacting family, Gf, roommate/landlord. 6. Probable discharge 1-2 days    Sharma Covert, MD 08/31/2016, 11:34 AMPatient ID: Frank [name], male   DOB: 01/15/1951, 66 y.o.   MRN: [id number] Patient ID: Frank [name], male   DOB: July 30, 1950, 66 y.o.   MRN: [id number] Patient ID: Frank [name],  male   DOB: 08/05/1950, 66 y.o.   MRN: [id number] Patient ID: Frank [name], male   DOB: 07-Feb-1951, 66 y.o.   MRN: [id number] Patient ID: Frank [name], male   DOB: [birth year], 66 y.o.   MRN: [id number] Patient ID:  Frank [name], male   DOB: 12-29-1950, 66 y.o.   MRN: [id number] Patient ID: Frank [name], male   DOB: 12-12-1950, 66 y.o.   MRN: [id number]

## 2016-08-31 NOTE — [facility] Group Notes (Signed)
[facility] LCSW Group Therapy  08/31/2016 2:42 PM  Type of Therapy:  Group Therapy  Participation Level:  Active  Participation Quality:  Attentive  Affect:  Appropriate  Cognitive:  Alert and Oriented  Insight:  Improving  Engagement in Therapy:  Improving  Modes of Intervention:  Discussion, Education, Exploration, Rapport Building, Socialization and Support  Summary of Progress/Problems: [facility] Speaker came to talk about his personal journey with substance abuse and addiction. The pt processed ways by which to relate to the speaker. [place] speaker provided handouts and educational information pertaining to groups and services offered by the [facility].   Wynn Kernes N Smart LCSW 08/31/2016, 2:42 PM

## 2016-08-31 NOTE — Progress Notes (Signed)
Adult Psychoeducational Group Note  Date:  08/31/2016 Time:  845 am  Group Topic/Focus:  Recovery Goals:   The focus of this group is to identify appropriate goals for recovery and establish a plan to achieve them.  Participation Level:  Did Not Attend   Frank [name] 08/31/2016, 9:50 AM

## 2016-08-31 NOTE — Progress Notes (Signed)
Recreation Therapy Notes  Animal-Assisted Activity (AAA) Program Checklist/Progress Notes Patient Eligibility Criteria Checklist & Daily Group note for Rec TxIntervention  Date: 03.06.2018 Time: 2:50pm Location: [address]    AAA/T Program Assumption of Risk Form signed by Patient/ or Parent Legal Guardian Yes  Patient is free of allergies or sever asthma Yes  Patient reports no fear of animals Yes  Patient reports no history of cruelty to animals Yes  Patient understands his/her participation is voluntary Yes  Patient washes hands before animal contact Yes  Patient washes hands after animal contact Yes  Behavioral Response: Appropriate   Education:Hand Washing, Appropriate Animal Interaction   Education Outcome: Acknowledges education.   Clinical Observations/Feedback: Patient attended session and interacted appropriately with therapy dog and peers.  Laureen Ochs Narely Nobles, LRT/CTRS        Trinity Hyland L 08/31/2016 3:08 PM

## 2016-08-31 NOTE — Progress Notes (Signed)
D: Patient appears anxious with poor hygiene.  He has flat affect with minimal interaction today.  He reports poor sleep last night and complained about not receiving ativan.  His appetite is fair; energy is low and concentration is "ok."  Patient denies any withdrawal symptoms.  He rates his depression, hopelessness and anxiety as a 5.  He is not as talkative with staff today and is somewhat isolative.  He denies any thoughts of self harm.  Patient reports "being worried today."  Patient has minimized his substance abuse since admission.  Patient spoke about "the mess I made", however does not contribute it to his alcohol abuse. A: Continue to monitor medication management and MD orders.  Safety checks completed every 15 minutes per protocol.  Offer support and encouragement as needed. R: Patient remains isolative today; withdrawn.

## 2016-09-01 ENCOUNTER — Encounter (HOSPITAL_COMMUNITY): Payer: Self-pay | Admitting: Psychiatry

## 2016-09-01 MED ORDER — LORAZEPAM 1 MG PO TABS
2.0000 mg | ORAL_TABLET | Freq: Once | ORAL | Status: AC
  Administered 2016-09-01: 2 mg via ORAL
  Filled 2016-09-01: qty 2

## 2016-09-01 MED ORDER — QUETIAPINE FUMARATE 300 MG PO TABS
600.0000 mg | ORAL_TABLET | Freq: Every day | ORAL | Status: DC
  Administered 2016-09-01 – 2016-09-02 (×2): 600 mg via ORAL
  Filled 2016-09-01: qty 2
  Filled 2016-09-01: qty 14
  Filled 2016-09-01 (×2): qty 2

## 2016-09-01 MED ORDER — GABAPENTIN 300 MG PO CAPS
600.0000 mg | ORAL_CAPSULE | Freq: Three times a day (TID) | ORAL | Status: DC
  Administered 2016-09-01 – 2016-09-03 (×7): 600 mg via ORAL
  Filled 2016-09-01: qty 42
  Filled 2016-09-01 (×2): qty 2
  Filled 2016-09-01: qty 42
  Filled 2016-09-01 (×8): qty 2
  Filled 2016-09-01: qty 42

## 2016-09-01 NOTE — Progress Notes (Signed)
CSW and pt met individually this afternoon. Pt reports that he is now willing to accept boarding house and extended stay hotel list and research places to stay with CSW. Patient given multiple resources and is calling/comparing prices and preparing for his upcoming discharge on Friday. Patient has accepted that he is scheduled to discharge and is actively working on plan. Mood is pleasant and cooperative at this time.   Maxie Better, MSW, LCSW Clinical Social Worker 09/01/2016 2:50 PM

## 2016-09-01 NOTE — BHH Group Notes (Signed)
[facility] LCSW Group Therapy  09/01/2016 2:48 PM  Type of Therapy:  Group Therapy  Participation Level:  Active  Participation Quality:  Attentive  Affect:  Appropriate  Cognitive:  Alert and Oriented  Insight:  Improving  Engagement in Therapy:  Improving  Modes of Intervention:  Confrontation, Discussion, Education, Problem-solving, Socialization and Support  Summary of Progress/Problems: Feelings Around Diagnosis: Patients were encouraged to explore their diagnosis and what this means to them. Group members were asked to share how their support network could best support them in recovery and how they feel they are viewed by society based on diagnosis/stigma/etc.   Kimber Relic Smart LCSW 09/01/2016, 2:48 PM

## 2016-09-01 NOTE — Progress Notes (Signed)
Nursing Progress Note: 7p-7a D: Pt currently presents with a agitated/depressed affect and behavior. Pt states "I don't want to go to home or a shelter. I don't want to talk with any social workers. They can't help me. I'm just mad I have nowhere to go." Interacting minmally with milieu. Pt reports off and on sleep with current medication regimen.   A: Pt provided with medications per providers orders. Pt's labs and vitals were monitored throughout the night. Pt supported emotionally and encouraged to express concerns and questions. Pt educated on medications.  R: Pt's safety ensured with 15 minute and environmental checks. Pt currently denies SI/HI/Self Harm and AVH. Pt verbally contracts to seek staff if SI/HI or A/VH occurs and to consult with staff before acting on any harmful thoughts. Will continue to monitor.

## 2016-09-01 NOTE — Progress Notes (Signed)
Nursing Progress Note: 7p-7a D: Pt currently presents with a pleasant/anxious affect and behavior. Pt states "I got a lot of great news today. My family raised money for me. I can move back into my old apartment again. I'm blessed." Interacting appropriately with milieu. Pt reports good sleep with current medication regimen.   A: Pt provided with medications per providers orders. Pt's labs and vitals were monitored throughout the night. Pt supported emotionally and encouraged to express concerns and questions. Pt educated on medications.  R: Pt's safety ensured with 15 minute and environmental checks. Pt currently denies SI/HI/Self Harm and AVH. Pt verbally contracts to seek staff if SI/HI or A/VH occurs and to consult with staff before acting on any harmful thoughts. Will continue to monitor.

## 2016-09-01 NOTE — Progress Notes (Signed)
D: Patient is anxious this morning.  He remains isolative to his room.  Patient states, "I'm being discharge in the next day or so.  I don't have anywhere to go.  I've been told to start calling places.  I've seen the social worker maybe 5 minutes the whole time I've been here.  The shelter is not an option."  Patient was given 2 mg ativan po per MD due to increasing anxiety and agitation.  Patient denies any thoughts of self harm.  Patient appears disheveled with body odor.  Patient presents with irritable mood.  He attended group and stated, "that's the most I've talked since I've been here."  Patient rates his depression as a 5; his hopelessness and anxiety as an 8. A: Continue to monitor medication management and MD orders.  Safety checks completed every 15 minutes per protocol.  Offer support and encouragement as needed. R: Patient is receptive to staff; his behavior is appropriate.

## 2016-09-01 NOTE — Progress Notes (Signed)
[facility] MD Progress Note  09/01/2016 10:51 AM Frank [name]  MRN:  [id number] Subjective:   66 yo Caucasian male, single, lives with a roommate, unemployed. Long history of depression. Presented to the ER in company of his brother and girlfriend. Family concerned about him. Reported to have been feeling very down in the past couple of weeks. Not taking care of himself. Withdrawn and stays in his room all the time. Not eating and not sleeping. Asked his brother to shoot him. Reports a lot of financial stressors. Has been coping poorly and resorted to alcohol. BAL, UDS and routine labs were essentially normal at presentation.   On 3/4 patient states that he has more energy. He did not have breakfast because he was sleeping. Says he is starving now and looks forward to launch. He still gets anxious when he thinks about his situation. Says he feels trapped. Suicidal thoughts comes in at times but he is able to take away from his mind. Says he does not consider suicide an option. No associated psychosis. No evidence of mania. Yet to shave his beard. Says he would ike to do it soon.   Nursing staff report that he is coming out more but stays to self. He reports anxiety when probed. He reports being worried about his issues. He reports not feeling suicidal. He does not report any evidence of psychosis. He has not been observed to be hallucinating. He has been tolerating his medications well. No behavioral issues.  On 3/5 he is seen and examined. I last saw the patient approximately 1 week ago. He does appear much better. He is speaking more, attending groups, taking his medication. He still complains of some mild dizziness. He talks about the fact he has not spoken to his family, friends or landlord since he was admitted. He feels a great deal of anxiety over this. We discussed the fact that the unknown is more frightening than the known. He denied any Si, A/V hallucinations.  On 3/6 patient is seen and examined, key  elements reviewed. Apparently patient was "upset" last night when ativan had been stopped. This was discussed. We discussed increasing his seroquel to help his sleep. Still avoiding talking to family about housing etc. Discussed anxiety provoking by procrastinating. No psychotic symptoms, discussed potential after care options.  On 3/7 he has appeared more irritable. Patient has placed himself in a situation of which only he can resolve. He is unable to face his relatives or friends because of what I consider his anxiety, guilt and shame. Because of this his housing options are limited to shelter situations. He requests info on residential programs, but these are primarily substance abuse related and he has steadfastly stated that his alcohol was not the major issue. He reflects the last time his was discharged to a motel he ended up relapsing on cocaine. I have tried to motivate him to confront his guilt and procrastination in an attempt to stabilize his housing situation. He does not accept this well. No SE to current medications  Principal Problem:   MDD severe without psychotic features. Diagnosis:   Patient Active Problem List   Diagnosis Date Noted  . MDD (major depressive disorder), recurrent severe, without psychosis ([place]) [F33.2] 08/15/2016  . MDD (major depressive disorder) [F32.9] 05/20/2012  . Cocaine abuse [F14.10] 05/20/2012    Class: Chronic  . Chronic pain associated with significant psychosocial dysfunction [G89.4] 04/01/2012  . Anxiety [F41.9]   . Depression [F32.9]   . Drug addiction (  [place]) [F19.20]    Total Time spent with patient: 20 minutes  Past Psychiatric History: As in H&P   Past Medical History:  Past Medical History:  Diagnosis Date  . Anxiety   . Depression   . Drug addiction ([place])    History reviewed. No pertinent surgical history. Family History: History reviewed. No pertinent family history. Family Psychiatric  History: As in H&P  Social History:   History  Alcohol Use  . 0.6 oz/week  . 1 Cans of beer per week    Comment: PAIN PILLS--PERCOCET/VICODIN     History  Drug Use  . Types: Other-see comments, "Crack" cocaine, Cocaine    Comment: smoking crack for the past week    Social History   Social History  . Marital status: Divorced    Spouse name: N/A  . Number of children: N/A  . Years of education: N/A   Social History Main Topics  . Smoking status: Never Smoker  . Smokeless tobacco: Never Used  . Alcohol use 0.6 oz/week    1 Cans of beer per week     Comment: PAIN PILLS--PERCOCET/VICODIN  . Drug use: Yes    Types: Other-see comments, "Crack" cocaine, Cocaine     Comment: smoking crack for the past week  . Sexual activity: Not Asked     Comment: oxycontin   Other Topics Concern  . None   Social History Narrative  . None   Additional Social History:   Sleep: Good  Appetite:  Better  Current Medications: Current Facility-Administered Medications  Medication Dose Route Frequency Provider Last Rate Last Dose  . acetaminophen (TYLENOL) tablet 650 mg  650 mg Oral Q6H PRN Lurena Nida, NP      . busPIRone (BUSPAR) tablet 15 mg  15 mg Oral TID Sharma Covert, MD   15 mg at 09/01/16 0749  . gabapentin (NEURONTIN) capsule 400 mg  400 mg Oral TID Sharma Covert, MD   400 mg at 09/01/16 0749  . mirtazapine (REMERON) tablet 30 mg  30 mg Oral QHS Artist Beach, MD   30 mg at 08/31/16 2135  . QUEtiapine (SEROQUEL) tablet 400 mg  400 mg Oral QHS Sharma Covert, MD   400 mg at 08/31/16 2135  . sertraline (ZOLOFT) tablet 200 mg  200 mg Oral Daily Artist Beach, MD   200 mg at 09/01/16 0749  . traZODone (DESYREL) tablet 50 mg  50 mg Oral QHS PRN Lurena Nida, NP   50 mg at 08/31/16 2236    Lab Results: No results found for this or any previous visit (from the past 48 hour(s)).  Blood Alcohol level:  Lab Results  Component Value Date   [facility] <5 08/14/2016   ETH <11 49/20/1007    Metabolic  Disorder Labs: Lab Results  Component Value Date   HGBA1C 5.7 (H) 08/17/2016   MPG 117 08/17/2016   MPG 126 (H) 04/02/2012   No results found for: PROLACTIN Lab Results  Component Value Date   CHOL 330 (H) 04/02/2012   TRIG 244 (H) 04/02/2012   HDL 36 (L) 04/02/2012   CHOLHDL 9.2 04/02/2012   VLDL 49 (H) 04/02/2012   LDLCALC 245 (H) 04/02/2012   LDLCALC  08/25/2010    UNABLE TO CALCULATE IF TRIGLYCERIDE OVER 400 mg/dL        Total Cholesterol/HDL:CHD Risk Coronary Heart Disease Risk Table  Men   Women  1/2 Average Risk   3.4   3.3  Average Risk       5.0   4.4  2 X Average Risk   9.6   7.1  3 X Average Risk  23.4   11.0        Use the calculated Patient Ratio above and the CHD Risk Table to determine the patient's CHD Risk.        ATP III CLASSIFICATION (LDL):  <100     mg/dL   Optimal  100-129  mg/dL   Near or Above                    Optimal  130-159  mg/dL   Borderline  160-189  mg/dL   High  >190     mg/dL   Very High    Physical Findings: AIMS: Facial and Oral Movements Muscles of Facial Expression: None, normal Lips and Perioral Area: None, normal Jaw: None, normal Tongue: None, normal,Extremity Movements Upper (arms, wrists, hands, fingers): None, normal Lower (legs, knees, ankles, toes): None, normal, Trunk Movements Neck, shoulders, hips: None, normal, Overall Severity Severity of abnormal movements (highest score from questions above): None, normal Incapacitation due to abnormal movements: None, normal Patient's awareness of abnormal movements (rate only patient's report): No Awareness, Dental Status Current problems with teeth and/or dentures?: No Does patient usually wear dentures?: No  CIWA:  CIWA-Ar Total: 4 COWS:  COWS Total Score: 1  Musculoskeletal: Strength & Muscle Tone: within normal limits Gait & Station: normal Patient leans: N/A  Psychiatric Specialty Exam: Physical Exam  Nursing note and vitals  reviewed. Constitutional: He is oriented to person, place, and time. He appears well-developed.  HENT:  Head: Normocephalic.  Eyes: Pupils are equal, round, and reactive to light.  Neck: Normal range of motion. Neck supple.  Cardiovascular: Normal rate and regular rhythm.   Respiratory: Effort normal and breath sounds normal.  GI: Soft. Bowel sounds are normal.  Musculoskeletal: Normal range of motion.  Neurological: He is alert and oriented to person, place, and time.  Skin: Skin is warm and dry.  Psychiatric:  As above    ROS  Blood pressure 116/74, pulse 83, temperature 97.6 F (36.4 C), temperature source Oral, resp. rate 16, height 5' 7.5" (1.715 m), weight 81.6 kg (180 lb).Body mass index is 27.78 kg/m.  General Appearance:  Neatly dressed. Cooperative but still anxious.  Good rapport  Eye Contact: Good  Speech:  Spontaneous. Normal rate tone and volume.    Volume:  Normal  Mood:  Less depressed  Affect:  Restricted and appropriate  Thought Process:  Goal Directed  Orientation:  Full (Time, Place, and Person)  Thought Content:   No preoccupation with violent thoughts. Negative ruminations about the future.  No hallucination in any modality.    Suicidal Thoughts:  Off and on. No intent  Homicidal Thoughts:  No  Memory:  Immediate;   Fair Recent;   Fair Remote;   Fair  Judgement:  Poor  Insight:  Lacking  Psychomotor Activity:  Mildly increased  Concentration:  Concentration: Fair and Attention Span: Fair  Recall:  [employer] of Knowledge:  Fair  Language:  Good  Akathisia:  No  Handed:    AIMS (if indicated):     Assets:  Communication Skills  ADL's:  Impaired  Cognition:  WNL  Sleep:  Number of Hours: 5.25     Treatment Plan Summary: Patient is tolerating recent  medication adjustment well. Depression and anxiety are slowly improving.    Psychiatric: MDD recurrent   Medical:  Psychosocial:  Unemployed Homeless Financial constraints   PLAN: 1.  Increase neurontin to 600 mg TID for anxiety and mood stabilization 2. Assess [facility] 3. Continue to monitor mood, behavior and interaction with peers. 4. Continue to encourage grooming and activation. 5. Psychosocial issues need to be addressed by patient such as housing, contacting family, Gf, roommate/landlord. 6. Probable discharge 1-2 days    Sharma Covert, MD 09/01/2016, 10:51 AMPatient ID: Frank [name], male   DOB: [birth year], 66 y.o.   MRN: [id number] Patient ID: JAX KENTNER, male   DOB: Jul 16, 1950, 66 y.o.   MRN: [id number] Patient ID: VELTON ROSELLE, male   DOB: 22-Dec-1950, 66 y.o.   MRN: [id number] Patient ID: KOLLYN LINGAFELTER, male   DOB: [birth year], 66 y.o.   MRN: [id number] Patient ID: KARSIN PESTA, male   DOB: [birth year], 66 y.o.   MRN: [id number] Patient ID: CAYDENCE ENCK, male   DOB: [birth year], 66 y.o.   MRN: [id number] Patient ID: JACOBEY GURA, male   DOB: 20-Jan-1951, 66 y.o.   MRN: [id number] Patient ID: JAHLEN BOLLMAN, male   DOB: 1951/03/20, 66 y.o.   MRN: [id number]

## 2016-09-01 NOTE — Progress Notes (Signed)
  Adult Psychoeducational Group Note  Date:  09/01/2016 Time:  3:09 PM  Group Topic/Focus:  Goals Group:   The focus of this group is to help patients establish daily goals to achieve during treatment and discuss how the patient can incorporate goal setting into their daily lives to aide in recovery. Stages of Change:   The focus of this group is to explain the stages of change and help patients identify changes they want to make upon discharge.  Participation Level:  Active  Participation Quality:  Appropriate, Attentive and Sharing  Affect:  Appropriate  Cognitive:  Appropriate  Insight: Appropriate, Good and Improving  Engagement in Group:  Developing/Improving  Modes of Intervention:  Discussion, Education and Support  Additional Comments:  Pt attended group about personal development and discussed personal plan for change. Pt discussed what his goal is after discharger, how and why he chose that goal and what to "watch out for" as far triggers and identify them. Pt discussed support system and ways to stay on track with goal. Pt also stated his goal is seek a place of living and aftercare plans for treatment.  Abe People Brittini 09/01/2016, 3:09 PM

## 2016-09-01 NOTE — BHH Group Notes (Signed)
Pt attended the NA meeting tonight at 8:00.

## 2016-09-01 NOTE — Progress Notes (Signed)
Recreation Therapy Notes  Date: 09/01/16 Time: 0930 Location: [address] Group Room  Group Topic: Stress Management  Goal Area(s) Addresses:  Patient will verbalize importance of using healthy stress management.  Patient will identify positive emotions associated with healthy stress management.   Intervention: Stress Management  Activity :  Mindfulness Meditation.  LRT introduced the stress management technique of mindfulness meditation.  LRT played a meditation from the Calm app to allow patients to participate in mindfulness.  Patients were to follow along as the meditation played.   Education:  Stress Management, Discharge Planning.   Education Outcome: Acknowledges edcuation/In group clarification offered/Needs additional education  Clinical Observations/Feedback: Pt did not attend group.   Victorino Sparrow, LRT/CTRS         Victorino Sparrow A 09/01/2016 11:36 AM

## 2016-09-02 ENCOUNTER — Encounter (HOSPITAL_COMMUNITY): Payer: Self-pay | Admitting: Psychiatry

## 2016-09-02 NOTE — BHH Group Notes (Signed)
[place] Group Notes:  (Nursing/MHT/Case Management/Adjunct)  Date:  09/02/2016  Time:  0845 am  Type of Therapy:  Psychoeducational Skills  Participation Level:  Did Not Attend Patient invited; declined to attend.  Summary of Progress/Problems:  Frank [name] 09/02/2016, 9:01 AM

## 2016-09-02 NOTE — Progress Notes (Signed)
[facility] MD Progress Note  09/02/2016 12:29 PM SANCHEZ HEMMER  MRN:  [id number] Subjective:   66 yo Caucasian male, single, lives with a roommate, unemployed. Long history of depression. Presented to the ER in company of his brother and girlfriend. Family concerned about him. Reported to have been feeling very down in the past couple of weeks. Not taking care of himself. Withdrawn and stays in his room all the time. Not eating and not sleeping. Asked his brother to shoot him. Reports a lot of financial stressors. Has been coping poorly and resorted to alcohol. BAL, UDS and routine labs were essentially normal at presentation.   On 3/4 patient states that he has more energy. He did not have breakfast because he was sleeping. Says he is starving now and looks forward to launch. He still gets anxious when he thinks about his situation. Says he feels trapped. Suicidal thoughts comes in at times but he is able to take away from his mind. Says he does not consider suicide an option. No associated psychosis. No evidence of mania. Yet to shave his beard. Says he would ike to do it soon.   Nursing staff report that he is coming out more but stays to self. He reports anxiety when probed. He reports being worried about his issues. He reports not feeling suicidal. He does not report any evidence of psychosis. He has not been observed to be hallucinating. He has been tolerating his medications well. No behavioral issues.  On 3/5 he is seen and examined. I last saw the patient approximately 1 week ago. He does appear much better. He is speaking more, attending groups, taking his medication. He still complains of some mild dizziness. He talks about the fact he has not spoken to his family, friends or landlord since he was admitted. He feels a great deal of anxiety over this. We discussed the fact that the unknown is more frightening than the known. He denied any Si, A/V hallucinations.  On 3/6 patient is seen and examined, key  elements reviewed. Apparently patient was "upset" last night when ativan had been stopped. This was discussed. We discussed increasing his seroquel to help his sleep. Still avoiding talking to family about housing etc. Discussed anxiety provoking by procrastinating. No psychotic symptoms, discussed potential after care options.  On 3/7 he has appeared more irritable. Patient has placed himself in a situation of which only he can resolve. He is unable to face his relatives or friends because of what I consider his anxiety, guilt and shame. Because of this his housing options are limited to shelter situations. He requests info on residential programs, but these are primarily substance abuse related and he has steadfastly stated that his alcohol was not the major issue. He reflects the last time his was discharged to a motel he ended up relapsing on cocaine. I have tried to motivate him to confront his guilt and procrastination in an attempt to stabilize his housing situation. He does not accept this well. No SE to current medications  On 3/8 he is doing better. He contacted brother and the family and friends are helping with financial issues and his roommate is taking him back so he has housing. He slept better. His mood and anxiety are much better. We discussed discharge for tomorrow.  Principal Problem:   MDD severe without psychotic features. Diagnosis:   Patient Active Problem List   Diagnosis Date Noted  . MDD (major depressive disorder), recurrent severe, without psychosis ([place]) [  F33.2] 08/15/2016  . MDD (major depressive disorder) [F32.9] 05/20/2012  . Cocaine abuse [F14.10] 05/20/2012    Class: Chronic  . Chronic pain associated with significant psychosocial dysfunction [G89.4] 04/01/2012  . Anxiety [F41.9]   . Depression [F32.9]   . Drug addiction ([place]) [F19.20]    Total Time spent with patient: 20 minutes  Past Psychiatric History: As in H&P   Past Medical History:  Past Medical  History:  Diagnosis Date  . Anxiety   . Depression   . Drug addiction ([place])    History reviewed. No pertinent surgical history. Family History: History reviewed. No pertinent family history. Family Psychiatric  History: As in H&P  Social History:  History  Alcohol Use  . 0.6 oz/week  . 1 Cans of beer per week    Comment: PAIN PILLS--PERCOCET/VICODIN     History  Drug Use  . Types: Other-see comments, "Crack" cocaine, Cocaine    Comment: smoking crack for the past week    Social History   Social History  . Marital status: Divorced    Spouse name: N/A  . Number of children: N/A  . Years of education: N/A   Social History Main Topics  . Smoking status: Never Smoker  . Smokeless tobacco: Never Used  . Alcohol use 0.6 oz/week    1 Cans of beer per week     Comment: PAIN PILLS--PERCOCET/VICODIN  . Drug use: Yes    Types: Other-see comments, "Crack" cocaine, Cocaine     Comment: smoking crack for the past week  . Sexual activity: Not Asked     Comment: oxycontin   Other Topics Concern  . None   Social History Narrative  . None   Additional Social History:   Sleep: Good  Appetite:  Better  Current Medications: Current Facility-Administered Medications  Medication Dose Route Frequency Provider Last Rate Last Dose  . acetaminophen (TYLENOL) tablet 650 mg  650 mg Oral Q6H PRN Lurena Nida, NP      . busPIRone (BUSPAR) tablet 15 mg  15 mg Oral TID Sharma Covert, MD   15 mg at 09/02/16 1133  . gabapentin (NEURONTIN) capsule 600 mg  600 mg Oral TID Sharma Covert, MD   600 mg at 09/02/16 1133  . mirtazapine (REMERON) tablet 30 mg  30 mg Oral QHS Artist Beach, MD   30 mg at 09/01/16 2208  . QUEtiapine (SEROQUEL) tablet 600 mg  600 mg Oral QHS Sharma Covert, MD   600 mg at 09/01/16 2208  . sertraline (ZOLOFT) tablet 200 mg  200 mg Oral Daily Artist Beach, MD   200 mg at 09/02/16 0755  . traZODone (DESYREL) tablet 50 mg  50 mg Oral QHS PRN Lurena Nida, NP   50 mg at 08/31/16 2236    Lab Results: No results found for this or any previous visit (from the past 48 hour(s)).  Blood Alcohol level:  Lab Results  Component Value Date   [facility] <5 08/14/2016   ETH <11 42/87/6811    Metabolic Disorder Labs: Lab Results  Component Value Date   HGBA1C 5.7 (H) 08/17/2016   MPG 117 08/17/2016   MPG 126 (H) 04/02/2012   No results found for: PROLACTIN Lab Results  Component Value Date   CHOL 330 (H) 04/02/2012   TRIG 244 (H) 04/02/2012   HDL 36 (L) 04/02/2012   CHOLHDL 9.2 04/02/2012   VLDL 49 (H) 04/02/2012   LDLCALC 245 (H) 04/02/2012  [place]  08/25/2010    UNABLE TO CALCULATE IF TRIGLYCERIDE OVER 400 mg/dL        Total Cholesterol/HDL:CHD Risk Coronary Heart Disease Risk Table                     Men   Women  1/2 Average Risk   3.4   3.3  Average Risk       5.0   4.4  2 X Average Risk   9.6   7.1  3 X Average Risk  23.4   11.0        Use the calculated Patient Ratio above and the CHD Risk Table to determine the patient's CHD Risk.        ATP III CLASSIFICATION (LDL):  <100     mg/dL   Optimal  100-129  mg/dL   Near or Above                    Optimal  130-159  mg/dL   Borderline  160-189  mg/dL   High  >190     mg/dL   Very High    Physical Findings: AIMS: Facial and Oral Movements Muscles of Facial Expression: None, normal Lips and Perioral Area: None, normal Jaw: None, normal Tongue: None, normal,Extremity Movements Upper (arms, wrists, hands, fingers): None, normal Lower (legs, knees, ankles, toes): None, normal, Trunk Movements Neck, shoulders, hips: None, normal, Overall Severity Severity of abnormal movements (highest score from questions above): None, normal Incapacitation due to abnormal movements: None, normal Patient's awareness of abnormal movements (rate only patient's report): No Awareness, Dental Status Current problems with teeth and/or dentures?: No Does patient usually wear dentures?: No   CIWA:  CIWA-Ar Total: 4 COWS:  COWS Total Score: 1  Musculoskeletal: Strength & Muscle Tone: within normal limits Gait & Station: normal Patient leans: N/A  Psychiatric Specialty Exam: Physical Exam  Nursing note and vitals reviewed. Constitutional: He is oriented to person, place, and time. He appears well-developed.  HENT:  Head: Normocephalic.  Eyes: Pupils are equal, round, and reactive to light.  Neck: Normal range of motion. Neck supple.  Cardiovascular: Normal rate and regular rhythm.   Respiratory: Effort normal and breath sounds normal.  GI: Soft. Bowel sounds are normal.  Musculoskeletal: Normal range of motion.  Neurological: He is alert and oriented to person, place, and time.  Skin: Skin is warm and dry.  Psychiatric:  As above    ROS  Blood pressure 111/73, pulse 85, temperature 97.3 F (36.3 C), temperature source Oral, resp. rate 18, height 5' 7.5" (1.715 m), weight 81.6 kg (180 lb).Body mass index is 27.78 kg/m.  General Appearance:  Neatly dressed. Cooperative but still anxious.  Good rapport  Eye Contact: Good  Speech:  Spontaneous. Normal rate tone and volume.    Volume:  Normal  Mood:  Less depressed  Affect:  Restricted and appropriate  Thought Process:  Goal Directed  Orientation:  Full (Time, Place, and Person)  Thought Content:   No preoccupation with violent thoughts. Negative ruminations about the future.  No hallucination in any modality.    Suicidal Thoughts:  Off and on. No intent  Homicidal Thoughts:  No  Memory:  Immediate;   Fair Recent;   Fair Remote;   Fair  Judgement:  Poor  Insight:  Lacking  Psychomotor Activity:  Mildly increased  Concentration:  Concentration: Fair and Attention Span: Fair  Recall:  [employer] of Knowledge:  Fair  Language:  Good  Akathisia:  No  Handed:    AIMS (if indicated):     Assets:  Communication Skills  ADL's:  Impaired  Cognition:  WNL  Sleep:  Number of Hours: 5.5     Treatment Plan  Summary: Patient is tolerating recent medication adjustment well. Depression and anxiety are improving.    Psychiatric: MDD recurrent   Medical:  Psychosocial:  Unemployed Homeless Financial constraints   PLAN: 1. No change in current meds 2. Plan for discharge tomorrow    Sharma Covert, MD 09/02/2016, 12:29 PMPatient ID: Iris Pert, male   DOB: 1951-06-03, 66 y.o.   MRN: [id number] Patient ID: VOSHON PETRO, male   DOB: 1950/11/17, 66 y.o.   MRN: [id number] Patient ID: HARSHAN KEARLEY, male   DOB: [birth year], 66 y.o.   MRN: [id number] Patient ID: MARVELOUS BOUWENS, male   DOB: 1950/10/05, 66 y.o.   MRN: [id number] Patient ID: LYNELL KUSSMAN, male   DOB: Nov 07, 1950, 66 y.o.   MRN: [id number] Patient ID: MILUS FRITZE, male   DOB: [birth year], 66 y.o.   MRN: [id number] Patient ID: TAYARI YANKEE, male   DOB: 1951-06-24, 65 y.o.   MRN: [id number] Patient ID: FREEMON BINFORD, male   DOB: 1950/11/27, 66 y.o.   MRN: [id number] Patient ID: BEAUMONT AUSTAD, male   DOB: [birth year], 66 y.o.   MRN: [id number]

## 2016-09-02 NOTE — Progress Notes (Signed)
D: Patient reports he slept well last night.  He is not as anxious over his housing situation and expects to be discharged tomorrow.  He continues to report anxiety when he thinks about his situation.  He minimizes his alcohol abuse and does not associate his substance abuse with his current situation.  He denies any thoughts of self harm.  He denies any psychosis or thoughts of harming someone else.  Patient is future oriented and is hopeful now that his family is assisting him with his financial situation.  He is interacting well with staff and peers.  His goal today is to work on his "aftercare plan."  A: Continue to monitor medication management and MD orders.  Safety checks completed every 15 minutes per protocol.  Offer support and encouragement as needed. R: patient is receptive to staff; his behavior is appropriate.

## 2016-09-02 NOTE — BHH Group Notes (Signed)
[place] LCSW Group Therapy  09/02/2016 2:46 PM  Type of Therapy:  Group Therapy  Participation Level:  Did Not Attend-pt invited. Chose to rest in room.   Summary of Progress/Problems: Emotion Regulation: This group focused on both positive and negative emotion identification and allowed group members to process ways to identify feelings, regulate negative emotions, and find healthy ways to manage internal/external emotions. Group members were asked to reflect on a time when their reaction to an emotion led to a negative outcome and explored how alternative responses using emotion regulation would have benefited them. Group members were also asked to discuss a time when emotion regulation was utilized when a negative emotion was experienced.   Macall Mccroskey N Smart  LCSW 09/02/2016, 2:46 PM

## 2016-09-03 MED ORDER — MIRTAZAPINE 30 MG PO TABS: 30.0000 mg | ORAL_TABLET | Freq: Every day | ORAL | 0 refills | Status: DC

## 2016-09-03 MED ORDER — BUSPIRONE HCL 15 MG PO TABS: 15.0000 mg | ORAL_TABLET | Freq: Three times a day (TID) | ORAL | 0 refills | Status: DC

## 2016-09-03 MED ORDER — GABAPENTIN 300 MG PO CAPS
600.0000 mg | ORAL_CAPSULE | Freq: Three times a day (TID) | ORAL | 0 refills | Status: DC
Start: 2016-09-03 — End: 2018-10-18

## 2016-09-03 MED ORDER — TRAZODONE HCL 50 MG PO TABS: 50.0000 mg | ORAL_TABLET | Freq: Every evening | ORAL | 0 refills | Status: DC | PRN

## 2016-09-03 MED ORDER — ADULT MULTIVITAMIN W/MINERALS CH
ORAL_TABLET | ORAL | Status: AC
  Filled 2016-09-03: qty 1

## 2016-09-03 MED ORDER — VITAMIN B-1 100 MG PO TABS
ORAL_TABLET | ORAL | Status: AC
  Filled 2016-09-03: qty 1

## 2016-09-03 MED ORDER — QUETIAPINE FUMARATE 300 MG PO TABS: 600.0000 mg | ORAL_TABLET | Freq: Every day | ORAL | 0 refills | Status: DC

## 2016-09-03 MED ORDER — SERTRALINE HCL 100 MG PO TABS: 200.0000 mg | ORAL_TABLET | Freq: Every day | ORAL | 0 refills | Status: DC

## 2016-09-03 NOTE — Progress Notes (Signed)
Patient attended group and said that today was a 5.   . Something positive that happened was he will discharged tomorrow. Patient used coloring and playing cards game as his  coping skill for the day.

## 2016-09-03 NOTE — Progress Notes (Signed)
Recreation Therapy Notes  Date: 09/03/16 Time: 0930 Location: [address] Dayroom  Group Topic: Stress Management  Goal Area(s) Addresses:  Patient will verbalize importance of using healthy stress management.  Patient will identify positive emotions associated with healthy stress management.   Intervention: Stress Management  Activity :  Progressive Muscle Relaxation.  LRT introduced the stress management technique of progressive muscle relaxation.  LRT read a script to allow patients the opportunity to engage in the activity.  Patients were to follow along as the script was read.  Education: Stress Management, Discharge Planning.   Education Outcome: Acknowledges edcuation/In group clarification offered/Needs additional education  Clinical Observations/Feedback: Pt did not attend group.   Victorino Sparrow, LRT/CTRS        Victorino Sparrow A 09/03/2016 12:23 PM

## 2016-09-03 NOTE — BHH Suicide Risk Assessment (Signed)
[facility] Discharge Suicide Risk Assessment   Principal Problem: MDD (major depressive disorder), recurrent severe, without psychosis ([place]) Discharge Diagnoses:  Patient Active Problem List   Diagnosis Date Noted  . MDD (major depressive disorder), recurrent severe, without psychosis ([place]) [F33.2] 08/15/2016  . MDD (major depressive disorder) [F32.9] 05/20/2012  . Cocaine abuse [F14.10] 05/20/2012    Class: Chronic  . Chronic pain associated with significant psychosocial dysfunction [G89.4] 04/01/2012  . Anxiety [F41.9]   . Depression [F32.9]   . Drug addiction ([place]) [F19.20]     Total Time spent with patient: 20 minutes  Musculoskeletal: Strength & Muscle Tone: within normal limits Gait & Station: normal Patient leans: N/A  Psychiatric Specialty Exam: ROS  Blood pressure (!) 143/86, pulse 82, temperature 97.3 F (36.3 C), temperature source Oral, resp. rate 18, height 5' 7.5" (1.715 m), weight 81.6 kg (180 lb).Body mass index is 27.78 kg/m.  General Appearance: Casual  Eye Contact::  Fair  Speech:  Normal Rate409  Volume:  Normal  Mood:  Anxious  Affect:  Congruent  Thought Process:  Coherent  Orientation:  Full (Time, Place, and Person)  Thought Content:  Logical  Suicidal Thoughts:  No  Homicidal Thoughts:  No  Memory:  Immediate;   Fair  Judgement:  Intact  Insight:  Fair  Psychomotor Activity:  Normal  Concentration:  Good  Recall:  Good  Fund of Knowledge:Good  Language: Good  Akathisia:  No  Handed:  Right  AIMS (if indicated):     Assets:  Communication Skills Desire for Improvement Housing Social Support  Sleep:  Number of Hours: 6.25  Cognition: WNL  ADL's:  Intact   Mental Status Per Nursing Assessment::   On Admission:     Demographic Factors:  Male, Caucasian, Low socioeconomic status, Living alone and Unemployed  Loss Factors: Financial problems/change in socioeconomic status  Historical Factors: Impulsivity  Risk Reduction Factors:    Living with another person, especially a relative and Positive social support  Continued Clinical Symptoms:  Severe Anxiety and/or Agitation Depression:   Impulsivity Alcohol/Substance Abuse/Dependencies  Cognitive Features That Contribute To Risk:  Closed-mindedness    Suicide Risk:  Minimal: No identifiable suicidal ideation.  Patients presenting with no risk factors but with morbid ruminations; may be classified as minimal risk based on the severity of the depressive symptoms  Follow-up Information    [facility] Follow up.   Specialty:  Behavioral Health Why:  Walk in between 8am-9am Monday through Friday for hospital follow-up/medication management/assessment for counseling services. Walk in within 7 days of hospital discharge to be assessed for services. Thank you.  Contact information: [place] [address] [phone]           Plan Of Care/Follow-up recommendations:  Activity:  ad lib  Sharma Covert, MD 09/03/2016, 8:02 AM

## 2016-09-03 NOTE — Progress Notes (Signed)
Patient to discharged ambulatory and stable to lobby. All discharge paperwork given and signed, valuables returned. Prescriptions given. Patient able to verbalize understanding. Patient stable, denies SI/HI/AVH. Patient given opportunity to express concerns and ask questions.

## 2016-09-03 NOTE — Tx Team (Signed)
Interdisciplinary Treatment and Diagnostic Plan Update  09/03/2016 Time of Session: 0930 Frank [name] MRN: [id number]  Principal Diagnosis: MDD without psychotic features  Secondary Diagnoses: Principal Problem:   MDD (major depressive disorder), recurrent severe, without psychosis ([place])   Current Medications:  Current Facility-Administered Medications  Medication Dose Route Frequency Provider Last Rate Last Dose  . multivitamin with minerals tablet           . thiamine (VITAMIN B-1) 100 MG tablet           . acetaminophen (TYLENOL) tablet 650 mg  650 mg Oral Q6H PRN Lurena Nida, NP      . busPIRone (BUSPAR) tablet 15 mg  15 mg Oral TID Sharma Covert, MD   15 mg at 09/03/16 0756  . gabapentin (NEURONTIN) capsule 600 mg  600 mg Oral TID Sharma Covert, MD   600 mg at 09/03/16 0756  . mirtazapine (REMERON) tablet 30 mg  30 mg Oral QHS Artist Beach, MD   30 mg at 09/02/16 2103  . QUEtiapine (SEROQUEL) tablet 600 mg  600 mg Oral QHS Sharma Covert, MD   600 mg at 09/02/16 2104  . sertraline (ZOLOFT) tablet 200 mg  200 mg Oral Daily Artist Beach, MD   200 mg at 09/03/16 0757  . traZODone (DESYREL) tablet 50 mg  50 mg Oral QHS PRN Lurena Nida, NP   50 mg at 09/02/16 2103   PTA Medications: Prescriptions Prior to Admission  Medication Sig Dispense Refill Last Dose  . ibuprofen (ADVIL,MOTRIN) 200 MG tablet Take 600 mg by mouth every 6 (six) hours as needed for moderate pain.   Past Week at Unknown time  . FLUoxetine (PROZAC) 20 MG capsule Take 1 capsule (20 mg total) by mouth daily. (Patient not taking: Reported on 08/14/2016) 30 capsule 0 Not Taking at Unknown time  . QUEtiapine (SEROQUEL) 400 MG tablet Take 2 tablets (800 mg total) by mouth at bedtime. (Patient not taking: Reported on 08/14/2016) 60 tablet 0 Not Taking at Unknown time    Patient Stressors: Financial difficulties Marital or family conflict Other: Unspecified  Patient Strengths: Average or above  average intelligence Communication skills Supportive family/friends  Treatment Modalities: Medication Management, Group therapy, Case management,  1 to 1 session with clinician, Psychoeducation, Recreational therapy.   Physician Treatment Plan for Primary Diagnosis:MDD without psychotic features Long Term Goal(s): Improvement in symptoms so as ready for discharge Improvement in symptoms so as ready for discharge   Short Term Goals: Ability to identify changes in lifestyle to reduce recurrence of condition will improve Ability to verbalize feelings will improve Ability to disclose and discuss suicidal ideas Ability to demonstrate self-control will improve Ability to identify and develop effective coping behaviors will improve Ability to maintain clinical measurements within normal limits will improve Compliance with prescribed medications will improve Ability to identify triggers associated with substance abuse/mental health issues will improve Ability to identify changes in lifestyle to reduce recurrence of condition will improve Ability to verbalize feelings will improve Ability to disclose and discuss suicidal ideas Ability to demonstrate self-control will improve Ability to identify and develop effective coping behaviors will improve Ability to maintain clinical measurements within normal limits will improve Compliance with prescribed medications will improve Ability to identify triggers associated with substance abuse/mental health issues will improve  Medication Management: Evaluate patient's response, side effects, and tolerance of medication regimen.  Therapeutic Interventions: 1 to 1 sessions, Unit Group sessions and Medication administration.  Evaluation of Outcomes: Met  Physician Treatment Plan for Secondary Diagnosis: Principal Problem:   MDD (major depressive disorder), recurrent severe, without psychosis ([place])  Long Term Goal(s): Improvement in symptoms so as ready  for discharge Improvement in symptoms so as ready for discharge   Short Term Goals: Ability to identify changes in lifestyle to reduce recurrence of condition will improve Ability to verbalize feelings will improve Ability to disclose and discuss suicidal ideas Ability to demonstrate self-control will improve Ability to identify and develop effective coping behaviors will improve Ability to maintain clinical measurements within normal limits will improve Compliance with prescribed medications will improve Ability to identify triggers associated with substance abuse/mental health issues will improve Ability to identify changes in lifestyle to reduce recurrence of condition will improve Ability to verbalize feelings will improve Ability to disclose and discuss suicidal ideas Ability to demonstrate self-control will improve Ability to identify and develop effective coping behaviors will improve Ability to maintain clinical measurements within normal limits will improve Compliance with prescribed medications will improve Ability to identify triggers associated with substance abuse/mental health issues will improve     Medication Management: Evaluate patient's response, side effects, and tolerance of medication regimen.  Therapeutic Interventions: 1 to 1 sessions, Unit Group sessions and Medication administration.  Evaluation of Outcomes: Met   RN Treatment Plan for Primary Diagnosis: MDD without psychotic features Long Term Goal(s): Knowledge of disease and therapeutic regimen to maintain health will improve  Short Term Goals: Ability to remain free from injury will improve, Ability to identify and develop effective coping behaviors will improve and Compliance with prescribed medications will improve  Medication Management: RN will administer medications as ordered by provider, will assess and evaluate patient's response and provide education to patient for prescribed medication. RN will  report any adverse and/or side effects to prescribing provider.  Therapeutic Interventions: 1 on 1 counseling sessions, Psychoeducation, Medication administration, Evaluate responses to treatment, Monitor vital signs and CBGs as ordered, Perform/monitor CIWA, COWS, AIMS and Fall Risk screenings as ordered, Perform wound care treatments as ordered.  Evaluation of Outcomes: Met   LCSW Treatment Plan for Primary Diagnosis: MDD without psychotic features Long Term Goal(s): Safe transition to appropriate next level of care at discharge, Engage patient in therapeutic group addressing interpersonal concerns.  Short Term Goals: Engage patient in aftercare planning with referrals and resources, Facilitate patient progression through stages of change regarding substance use diagnoses and concerns and Identify triggers associated with mental health/substance abuse issues  Therapeutic Interventions: Assess for all discharge needs, 1 to 1 time with Social worker, Explore available resources and support systems, Assess for adequacy in community support network, Educate family and significant other(s) on suicide prevention, Complete Psychosocial Assessment, Interpersonal group therapy.  Evaluation of Outcomes: Met   Progress in Treatment: Attending groups: Yes.  Participating in groups: Yes Taking medication as prescribed: Yes. Toleration medication: Yes. Family/Significant other contact made: SPE completed with pt; pt declined to consent to family contact.  Patient understands diagnosis: Yes. Discussing patient identified problems/goals with staff: Yes. Medical problems stabilized or resolved: Yes. Denies suicidal/homicidal ideation: Yes. Issues/concerns per patient self-inventory: No. Other: n/a   New problem(s) identified: No, Describe:  n/a  New Short Term/Long Term Goal(s): detox; medication stabilization; development of comprehensive mental wellness/sobriety plan.   Discharge Plan or  Barriers: CSW assessing for appropriate referrals. He was last admitted to [facility] in 2013 and referred to [facility]. Pt reports that he has not followed up with outpatient  provider or received psychiatric medications in the past 5 years.  2/23: Pt will follow-up at [facility]; homeless; pt lethargic and not vested in discussing follow-up at this time.  2/27: Pt has been getting up intermittently but continues to isolate in room for majority of day and is not willing to attend groups. Some paranoia observed 3/2: Pt is doing better--coming to groups; more interactive in the milieu; engaging with CSW regarding discharge plan. Pt was made aware that he was placed on [facility] waitlist and was adament that he did not want to go there. He was encouraged to continue working with CSW regarding safe and appropriate discharge plan.  3/9: Pt's family raised money for him to return to his apt; follow-up at [facility]; pt given multiple community resources including Mental health association pamphlet and AA/NA lists for [employer].   Reason for Continuation of Hospitalization: none  Estimated Length of Stay: discharge today  Attendees: Patient: 09/03/2016 8:43 AM  Physician: Dr. Mallie Darting MD 09/03/2016 8:43 AM  Nursing: Reita May RN 09/03/2016 8:43 AM  RN Care Manager: Lars Pinks CM 09/03/2016 8:43 AM  Social Worker: Maxie Better, LCSW;  09/03/2016 8:43 AM  Recreational Therapist: Rhunette Croft 09/03/2016 8:43 AM  Other: Lindell Spar NP; Samuel Jester NP 09/03/2016 8:43 AM  Other:  09/03/2016 8:43 AM  Other: 09/03/2016 8:43 AM    Scribe for Treatment Team: [place], LCSW 09/03/2016 8:43 AM

## 2016-09-03 NOTE — Progress Notes (Signed)
Pt has been in the dayroom most of the evening.  He stays to himself with little interaction with peers.  He reports that he is probably discharging tomorrow.  He plans to return to the place where he was living before his admission.  When asked how he felt about that, he said he was ok with it.  He says that his family has said they are going to help him get back on his feet financially.  He voiced no needs or concerns at this time.  He says his sleep has improved, and he is not having any withdrawal symptoms.  He denies SI/HI/AVH.  Support and encouragement offered.  Discharge plans are in process.  Safety maintained with q15 minute checks.

## 2016-09-03 NOTE — Progress Notes (Signed)
  [facility] Adult Case Management Discharge Plan :  Will you be returning to the same living situation after discharge:  Yes,  home to apt At discharge, do you have transportation home?: Yes,  family member or may need taxi--CSW checking in with him this morning regarding this Do you have the ability to pay for your medications: Yes,  mental health  Release of information consent forms completed and submitted to medical records by CSW.  Patient to Follow up at: Follow-up Information    [facility] Follow up.   Specialty:  Behavioral Health Why:  Walk in between 8am-9am Monday through Friday for hospital follow-up/medication management/assessment for counseling services. Walk in within 7 days of hospital discharge to be assessed for services. Thank you.  Contact information: [place] [address] [phone]           Next level of care provider has access to [facility] and Suicide Prevention discussed: Yes,  SPE completed with pt; pt declined to consent to family contact. SPI pamphlet and Mobile Crisis information provided to pt; he was encouraged to share this information with his support network.  Have you used any form of tobacco in the last 30 days? (Cigarettes, Smokeless Tobacco, Cigars, and/or Pipes): No  Has patient been referred to the Quitline?: N/A patient is not a smoker  Patient has been referred for addiction treatment: Yes  Kailynne Ferrington N Smart LCSW 09/03/2016, 8:45 AM

## 2016-09-03 NOTE — Discharge Summary (Signed)
Physician Discharge Summary Note  Patient:  Frank [name] is an 66 y.o., male MRN:  [id number] DOB:  1951-04-06 Patient phone:  [phone] (home)  Patient address:   [address] [place] [address],  Total Time spent with patient: Greater than 30 minutes  Date of Admission:  08/15/2016 Date of Discharge: 09-03-16  Reason for Admission: Worsening symptoms of depression.   Principal Problem: MDD (major depressive disorder), recurrent severe, without psychosis [facility])  Discharge Diagnoses: Patient Active Problem List   Diagnosis Date Noted  . MDD (major depressive disorder), recurrent severe, without psychosis ([place]) [F33.2] 08/15/2016  . MDD (major depressive disorder) [F32.9] 05/20/2012  . Cocaine abuse [F14.10] 05/20/2012    Class: Chronic  . Chronic pain associated with significant psychosocial dysfunction [G89.4] 04/01/2012  . Anxiety [F41.9]   . Depression [F32.9]   . Drug addiction ([place]) [F19.20]    Past Psychiatric History: Mdd, Alcohol use disorder  Past Medical History:  Past Medical History:  Diagnosis Date  . Anxiety   . Depression   . Drug addiction ([place])    History reviewed. No pertinent surgical history.  Family History: History reviewed. No pertinent family history.  Family Psychiatric  History: See H&P  Social History:  History  Alcohol Use  . 0.6 oz/week  . 1 Cans of beer per week    Comment: PAIN PILLS--PERCOCET/VICODIN     History  Drug Use  . Types: Other-see comments, "Crack" cocaine, Cocaine    Comment: smoking crack for the past week    Social History   Social History  . Marital status: Divorced    Spouse name: N/A  . Number of children: N/A  . Years of education: N/A   Social History Main Topics  . Smoking status: Never Smoker  . Smokeless tobacco: Never Used  . Alcohol use 0.6 oz/week    1 Cans of beer per week     Comment: PAIN PILLS--PERCOCET/VICODIN  . Drug use: Yes    Types: Other-see comments, "Crack" cocaine, Cocaine      Comment: smoking crack for the past week  . Sexual activity: Not Asked     Comment: oxycontin   Other Topics Concern  . None   Social History Narrative  . None   Hospital Course: Patient is a 66 YO male transferred from [facility] secondary to depression. The patient is essentially alexithymic and unable to provide a great deal of history. The patient presented this date voluntary brought in by brother (patient declines to give family name) due to passive thoughts of self harm without a plan. Patient is very anxious and renders limited history stating 'the last time I was honest with a counselor I was put in the hospital for three months." Patient states he has been residing with his brother and has recently just realized "how bad his life was." Patient reports ongoing depression with symptoms to include: hopelessness, isolating (pt states he has not been out of his room in one week) and excessive guilt. Patient states since he has lost his employment within the last month, he has relocated to reside with his brother. Patient stated his brother convinced him to seek help this date after patient has been isolating and "drinking every day" or he would be asked to leave. Patient reports daily alcohol use for the last two months stating he consumes "3 or 4 drinks (liquor) every day."  Upon his arrival & admision to the [facility] adult unit, Frank [name] was evaluated & his presenting symptoms identified.  He reported history of chronic alcoholism & having been lately abusing alcohol.  However, he was not presenting with any substance withdrawal symptoms & his toxicology reports showed a BAL of < 5. His UDS was negative of all substances. He did not receive detoxification treatments as result, but was started on; Buspar 15 for anxiety, Gabapentin 300 mg for agitation, Mirtazapine 30 mg for depression/insomnia, Seroquel 600 mg for mood control, Sertraline 200 mg for depression & Trazodone 50 mg for insomnia. Frank [name] was  enrolled & encouraged to participate in the unit programming, he did & learned coping skills. He presented no other significant health issues that required treatment or monitoring.   During the course of his hospitalization, Frank [name] was evaluated on daily basis by the clinical providers to assure his response to his treatment regimen.As his treatment progressed, improvement was noted as evidenced by his report of decreasing symptoms, improved mood, medication tolerance & active participation in the unit programming. He was encouraged to update his providers on his progress by daily completion of a self inventory, noting mood, mental status, pain, any new symptoms, anxiety and or concerns.   Frank [name]'s symptomssponded well to his treatment regimen combined with a therapeutic and supportive environment. He was motivated for recovery as evidenced by a positive/appropriate behavior and his interaction with the staff & fellow patients.He also worked closely with the treatment team and case manager to develop a discharge plan with appropriate goals to maintain mood stability after discharge.   Upon his hospital discharge, Frank [name] was in much improved condition than upon admission.His symptoms were reported as significantly decreased or resolved completely. He adamantly denies any SI/HI,  AVH, delusional thoughts & or paranoia. He was motivated to continue taking medication with a goal of continued improvement in mental health. He will continue psychiatric care on an outpatient basis as noted below. He is provided with all the necessary information required to make this appointment without problems. He received a 7 days worth, supply samples of his [facility] discharge medications. He left [facility] with all personal belongings in no apparent distress. Transportation per his arrangement.  Physical Findings: AIMS: Facial and Oral Movements Muscles of Facial Expression: None, normal Lips and Perioral Area: None, normal Jaw: None,  normal Tongue: None, normal,Extremity Movements Upper (arms, wrists, hands, fingers): None, normal Lower (legs, knees, ankles, toes): None, normal, Trunk Movements Neck, shoulders, hips: None, normal, Overall Severity Severity of abnormal movements (highest score from questions above): None, normal Incapacitation due to abnormal movements: None, normal Patient's awareness of abnormal movements (rate only patient's report): No Awareness, Dental Status Current problems with teeth and/or dentures?: No Does patient usually wear dentures?: No  CIWA:  CIWA-Ar Total: 4 COWS:  COWS Total Score: 1  Musculoskeletal: Strength & Muscle Tone: within normal limits Gait & Station: normal Patient leans: N/A  Psychiatric Specialty Exam: Physical Exam  Constitutional: He appears well-developed.  HENT:  Head: Normocephalic.  Eyes: Pupils are equal, round, and reactive to light.  Neck: Normal range of motion.  Cardiovascular: Normal rate.   Respiratory: Effort normal.  GI: Soft.  Genitourinary:  Genitourinary Comments: Denies any issues in this area  Musculoskeletal: Normal range of motion.  Neurological: He is alert.  Skin: Skin is warm.    Review of Systems  Constitutional: Negative.   HENT: Negative.   Eyes: Negative.   Respiratory: Negative.   Cardiovascular: Negative.   Gastrointestinal: Negative.   Genitourinary: Negative.   Musculoskeletal: Negative.   Skin: Negative.   Neurological: Negative.  Endo/Heme/Allergies: Negative.   Psychiatric/Behavioral: Positive for depression (Stable) and substance abuse (Hx. alcoholism). Negative for hallucinations, memory loss and suicidal ideas. The patient has insomnia (Stable). The patient is not nervous/anxious.     Blood pressure 107/77, pulse 79, temperature 98.8 F (37.1 C), temperature source Oral, resp. rate 18, height 5' 7.5" (1.715 m), weight 81.6 kg (180 lb).Body mass index is 27.78 kg/m.  See Md's SRA   Have you used any form of  tobacco in the last 30 days? (Cigarettes, Smokeless Tobacco, Cigars, and/or Pipes): No  Has this patient used any form of tobacco in the last 30 days? (Cigarettes, Smokeless Tobacco, Cigars, and/or Pipes):  No  Blood Alcohol level:  Lab Results  Component Value Date   [facility] <5 08/14/2016   ETH <11 93/90/3009   Metabolic Disorder Labs:  Lab Results  Component Value Date   HGBA1C 5.7 (H) 08/17/2016   MPG 117 08/17/2016   MPG 126 (H) 04/02/2012   No results found for: PROLACTIN Lab Results  Component Value Date   CHOL 330 (H) 04/02/2012   TRIG 244 (H) 04/02/2012   HDL 36 (L) 04/02/2012   CHOLHDL 9.2 04/02/2012   VLDL 49 (H) 04/02/2012   LDLCALC 245 (H) 04/02/2012   LDLCALC  08/25/2010    UNABLE TO CALCULATE IF TRIGLYCERIDE OVER 400 mg/dL        Total Cholesterol/HDL:CHD Risk Coronary Heart Disease Risk Table                     Men   Women  1/2 Average Risk   3.4   3.3  Average Risk       5.0   4.4  2 X Average Risk   9.6   7.1  3 X Average Risk  23.4   11.0        Use the calculated Patient Ratio above and the CHD Risk Table to determine the patient's CHD Risk.        ATP III CLASSIFICATION (LDL):  <100     mg/dL   Optimal  100-129  mg/dL   Near or Above                    Optimal  130-159  mg/dL   Borderline  160-189  mg/dL   High  >190     mg/dL   Very High   See Psychiatric Specialty Exam and Suicide Risk Assessment completed by Attending Physician prior to discharge.  Discharge destination:  Home  Is patient on multiple antipsychotic therapies at discharge:  No   Has Patient had three or more failed trials of antipsychotic monotherapy by history:  No  Recommended Plan for Multiple Antipsychotic Therapies: NA  Allergies as of 09/03/2016   No Known Allergies     Medication List    STOP taking these medications   FLUoxetine 20 MG capsule Commonly known as:  PROZAC   ibuprofen 200 MG tablet Commonly known as:  ADVIL,MOTRIN     TAKE these medications      Indication  busPIRone 15 MG tablet Commonly known as:  BUSPAR Take 1 tablet (15 mg total) by mouth 3 (three) times daily. For anxiety  Indication:  Generalized Anxiety Disorder   gabapentin 300 MG capsule Commonly known as:  NEURONTIN Take 2 capsules (600 mg total) by mouth 3 (three) times daily. For agitation  Indication:  Agitation   mirtazapine 30 MG tablet Commonly known as:  REMERON Take 1 tablet (  30 mg total) by mouth at bedtime. For depression.insomnia  Indication:  Major Depressive Disorder   QUEtiapine 300 MG tablet Commonly known as:  SEROQUEL Take 2 tablets (600 mg total) by mouth at bedtime. For mood control What changed:  medication strength  how much to take  additional instructions  Indication:  Mood control   sertraline 100 MG tablet Commonly known as:  ZOLOFT Take 2 tablets (200 mg total) by mouth daily. For depression Start taking on:  09/04/2016  Indication:  Major Depressive Disorder   traZODone 50 MG tablet Commonly known as:  DESYREL Take 1 tablet (50 mg total) by mouth at bedtime as needed for sleep.  Indication:  Trouble Sleeping      Follow-up Information    [facility] Follow up.   Specialty:  Behavioral Health Why:  Walk in between 8am-9am Monday through Friday for hospital follow-up/medication management/assessment for counseling services. Walk in within 7 days of hospital discharge to be assessed for services. Thank you.  Contact information: [place] [address] [phone]          Follow-up recommendations: Activity:  As tolerated Diet: As recommended by your primary care doctor. Keep all scheduled follow-up appointments as recommended.  Comments: Patient is instructed prior to discharge to: Take all medications as prescribed by his/her mental healthcare provider. Report any adverse effects and or reactions from the medicines to his/her outpatient provider promptly. Patient has been instructed & cautioned: To  not engage in alcohol and or illegal drug use while on prescription medicines. In the event of worsening symptoms, patient is instructed to call the crisis hotline, 911 and or go to the nearest ED for appropriate evaluation and treatment of symptoms. To follow-up with his/her primary care provider for your other medical issues, concerns and or health care needs.   Signed: Encarnacion Slates, NP, PMHNP, FNP-BC 09/03/2016, 10:07 AM

## 2016-09-03 NOTE — Plan of Care (Signed)
Problem: Coping: Goal: Ability to verbalize feelings will improve Outcome: Progressing Patient talks with staff about feelings and stated that he was unable to do so at beginning of admission; expressed anxiety at discharge options and returning to his problems.  Problem: Self-Concept: Goal: Level of anxiety will decrease Outcome: Progressing Patient reports increased anxiety today regarding housing situation and financial matters. Was able to discuss the issues and what he can do to improve his situation.  Problem: Education: Goal: Ability to state activities that reduce stress will improve Outcome: Progressing Patient identifies activities that calm him and reduce his anxiety

## 2018-10-16 ENCOUNTER — Encounter (HOSPITAL_COMMUNITY): Payer: Self-pay

## 2018-10-16 ENCOUNTER — Observation Stay (HOSPITAL_COMMUNITY)
Admission: EM | Admit: 2018-10-16 | Discharge: 2018-10-18 | Disposition: A | Discharge status: Home / Self Care | Payer: Medicare Other | Attending: Internal Medicine | Admitting: Internal Medicine

## 2018-10-16 ENCOUNTER — Other Ambulatory Visit: Payer: Self-pay

## 2018-10-16 DIAGNOSIS — R509 Fever, unspecified: Secondary | ICD-10-CM | POA: Diagnosis not present

## 2018-10-16 DIAGNOSIS — Z79899 Other long term (current) drug therapy: Secondary | ICD-10-CM | POA: Diagnosis not present

## 2018-10-16 DIAGNOSIS — I10 Essential (primary) hypertension: Secondary | ICD-10-CM | POA: Insufficient documentation

## 2018-10-16 DIAGNOSIS — I351 Nonrheumatic aortic (valve) insufficiency: Secondary | ICD-10-CM | POA: Diagnosis not present

## 2018-10-16 DIAGNOSIS — I700 Atherosclerosis of aorta: Secondary | ICD-10-CM | POA: Diagnosis not present

## 2018-10-16 DIAGNOSIS — F1410 Cocaine abuse, uncomplicated: Secondary | ICD-10-CM | POA: Insufficient documentation

## 2018-10-16 DIAGNOSIS — Z7289 Other problems related to lifestyle: Secondary | ICD-10-CM | POA: Insufficient documentation

## 2018-10-16 DIAGNOSIS — F1010 Alcohol abuse, uncomplicated: Secondary | ICD-10-CM | POA: Diagnosis not present

## 2018-10-16 DIAGNOSIS — F418 Other specified anxiety disorders: Secondary | ICD-10-CM | POA: Insufficient documentation

## 2018-10-16 DIAGNOSIS — F419 Anxiety disorder, unspecified: Secondary | ICD-10-CM | POA: Diagnosis present

## 2018-10-16 DIAGNOSIS — F1920 Other psychoactive substance dependence, uncomplicated: Secondary | ICD-10-CM | POA: Diagnosis present

## 2018-10-16 DIAGNOSIS — I160 Hypertensive urgency: Principal | ICD-10-CM | POA: Insufficient documentation

## 2018-10-16 DIAGNOSIS — Z20828 Contact with and (suspected) exposure to other viral communicable diseases: Secondary | ICD-10-CM | POA: Diagnosis not present

## 2018-10-16 DIAGNOSIS — E871 Hypo-osmolality and hyponatremia: Secondary | ICD-10-CM | POA: Insufficient documentation

## 2018-10-16 DIAGNOSIS — F329 Major depressive disorder, single episode, unspecified: Secondary | ICD-10-CM | POA: Diagnosis present

## 2018-10-16 DIAGNOSIS — F32A Depression, unspecified: Secondary | ICD-10-CM | POA: Diagnosis present

## 2018-10-16 HISTORY — DX: Alcohol abuse, uncomplicated: F10.10

## 2018-10-16 HISTORY — DX: Hypertensive urgency: I16.0

## 2018-10-16 NOTE — ED Triage Notes (Signed)
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

## 2018-10-17 ENCOUNTER — Other Ambulatory Visit: Payer: Self-pay

## 2018-10-17 ENCOUNTER — Emergency Department (HOSPITAL_COMMUNITY): Payer: Medicare Other

## 2018-10-17 ENCOUNTER — Encounter (HOSPITAL_COMMUNITY): Payer: Self-pay | Admitting: Internal Medicine

## 2018-10-17 ENCOUNTER — Observation Stay (HOSPITAL_BASED_OUTPATIENT_CLINIC_OR_DEPARTMENT_OTHER): Payer: Medicare Other

## 2018-10-17 DIAGNOSIS — I160 Hypertensive urgency: Secondary | ICD-10-CM | POA: Diagnosis not present

## 2018-10-17 DIAGNOSIS — R079 Chest pain, unspecified: Secondary | ICD-10-CM | POA: Diagnosis not present

## 2018-10-17 DIAGNOSIS — E871 Hypo-osmolality and hyponatremia: Secondary | ICD-10-CM | POA: Diagnosis present

## 2018-10-17 DIAGNOSIS — F1920 Other psychoactive substance dependence, uncomplicated: Secondary | ICD-10-CM

## 2018-10-17 DIAGNOSIS — F419 Anxiety disorder, unspecified: Secondary | ICD-10-CM

## 2018-10-17 DIAGNOSIS — F1010 Alcohol abuse, uncomplicated: Secondary | ICD-10-CM | POA: Diagnosis present

## 2018-10-17 LAB — RAPID URINE DRUG SCREEN, HOSP PERFORMED
Amphetamines: NOT DETECTED
Barbiturates: NOT DETECTED
Benzodiazepines: NOT DETECTED
Cocaine: NOT DETECTED
Opiates: NOT DETECTED
Tetrahydrocannabinol: NOT DETECTED

## 2018-10-17 LAB — URINALYSIS, ROUTINE W REFLEX MICROSCOPIC
Bacteria, UA: NONE SEEN
Bilirubin Urine: NEGATIVE
Glucose, UA: NEGATIVE mg/dL
Ketones, ur: 5 mg/dL — AB
Leukocytes,Ua: NEGATIVE
Nitrite: NEGATIVE
Protein, ur: NEGATIVE mg/dL
Specific Gravity, Urine: 1.013 (ref 1.005–1.030)
pH: 6 (ref 5.0–8.0)

## 2018-10-17 LAB — COMPREHENSIVE METABOLIC PANEL
ALT: 37 U/L (ref 0–44)
AST: 29 U/L (ref 15–41)
Albumin: 4.5 g/dL (ref 3.5–5.0)
Alkaline Phosphatase: 68 U/L (ref 38–126)
Anion gap: 14 (ref 5–15)
BUN: 16 mg/dL (ref 8–23)
CO2: 20 mmol/L — ABNORMAL LOW (ref 22–32)
Calcium: 9.8 mg/dL (ref 8.9–10.3)
Chloride: 101 mmol/L (ref 98–111)
Creatinine, Ser: 0.84 mg/dL (ref 0.61–1.24)
GFR calc Af Amer: >60 mL/min (ref >60)
GFR calc non Af Amer: >60 mL/min (ref >60)
Glucose, Bld: 101 mg/dL — ABNORMAL HIGH (ref 70–99)
Potassium: 3.8 mmol/L (ref 3.5–5.1)
Sodium: 135 mmol/L (ref 135–145)
Total Bilirubin: 0.7 mg/dL (ref 0.3–1.2)
Total Protein: 7.5 g/dL (ref 6.5–8.1)

## 2018-10-17 LAB — CBC WITH DIFFERENTIAL/PLATELET
Abs Immature Granulocytes: 0.04 10*3/uL (ref 0.00–0.07)
Basophils Absolute: 0.1 10*3/uL (ref 0.0–0.1)
Basophils Relative: 1 %
Eosinophils Absolute: 0.1 10*3/uL (ref 0.0–0.5)
Eosinophils Relative: 1 %
HCT: 49.8 % (ref 39.0–52.0)
Hemoglobin: 17 g/dL (ref 13.0–17.0)
Immature Granulocytes: 0 %
Lymphocytes Relative: 15 %
Lymphs Abs: 1.7 10*3/uL (ref 0.7–4.0)
MCH: 29.2 pg (ref 26.0–34.0)
MCHC: 34.1 g/dL (ref 30.0–36.0)
MCV: 85.4 fL (ref 80.0–100.0)
Monocytes Absolute: 0.9 10*3/uL (ref 0.1–1.0)
Monocytes Relative: 8 %
Neutro Abs: 8.3 10*3/uL — ABNORMAL HIGH (ref 1.7–7.7)
Neutrophils Relative %: 75 %
Platelets: 265 10*3/uL (ref 150–400)
RBC: 5.83 MIL/uL — ABNORMAL HIGH (ref 4.22–5.81)
RDW: 13.1 % (ref 11.5–15.5)
WBC: 11.1 10*3/uL — ABNORMAL HIGH (ref 4.0–10.5)
nRBC: 0 % (ref 0.0–0.2)

## 2018-10-17 LAB — ECHOCARDIOGRAM COMPLETE
Height: 68 in
Weight: 3168 oz

## 2018-10-17 LAB — POCT I-STAT 7, (LYTES, BLD GAS, ICA,H+H)
Acid-base deficit: 1 mmol/L (ref 0.0–2.0)
Bicarbonate: 23 mmol/L (ref 20.0–28.0)
Calcium, Ion: 1.26 mmol/L (ref 1.15–1.40)
HCT: 47 % (ref 39.0–52.0)
Hemoglobin: 16 g/dL (ref 13.0–17.0)
O2 Saturation: 96 %
Patient temperature: 98.6
Potassium: 3.9 mmol/L (ref 3.5–5.1)
Sodium: 133 mmol/L — ABNORMAL LOW (ref 135–145)
TCO2: 24 mmol/L (ref 22–32)
pCO2 arterial: 35.8 mmHg (ref 32.0–48.0)
pH, Arterial: 7.416 (ref 7.350–7.450)
pO2, Arterial: 83 mmHg (ref 83.0–108.0)

## 2018-10-17 LAB — TROPONIN I
Troponin I: <0.03 ng/mL (ref <0.03)
Troponin I: <0.03 ng/mL (ref <0.03)
Troponin I: <0.03 ng/mL (ref <0.03)

## 2018-10-17 LAB — LACTIC ACID, PLASMA
Lactic Acid, Venous: 1.3 mmol/L (ref 0.5–1.9)
Lactic Acid, Venous: 0.9 mmol/L (ref 0.5–1.9)

## 2018-10-17 LAB — SARS CORONAVIRUS 2 BY RT PCR (HOSPITAL ORDER, PERFORMED IN ~~LOC~~ HOSPITAL LAB): SARS Coronavirus 2: NEGATIVE

## 2018-10-17 IMAGING — DX PORTABLE CHEST - 1 VIEW
1 series · 1 of 1 positions shown · non-contrast
Comparison: None.

CLINICAL DATA: Fever

EXAM:
PORTABLE CHEST 1 VIEW

[chest]
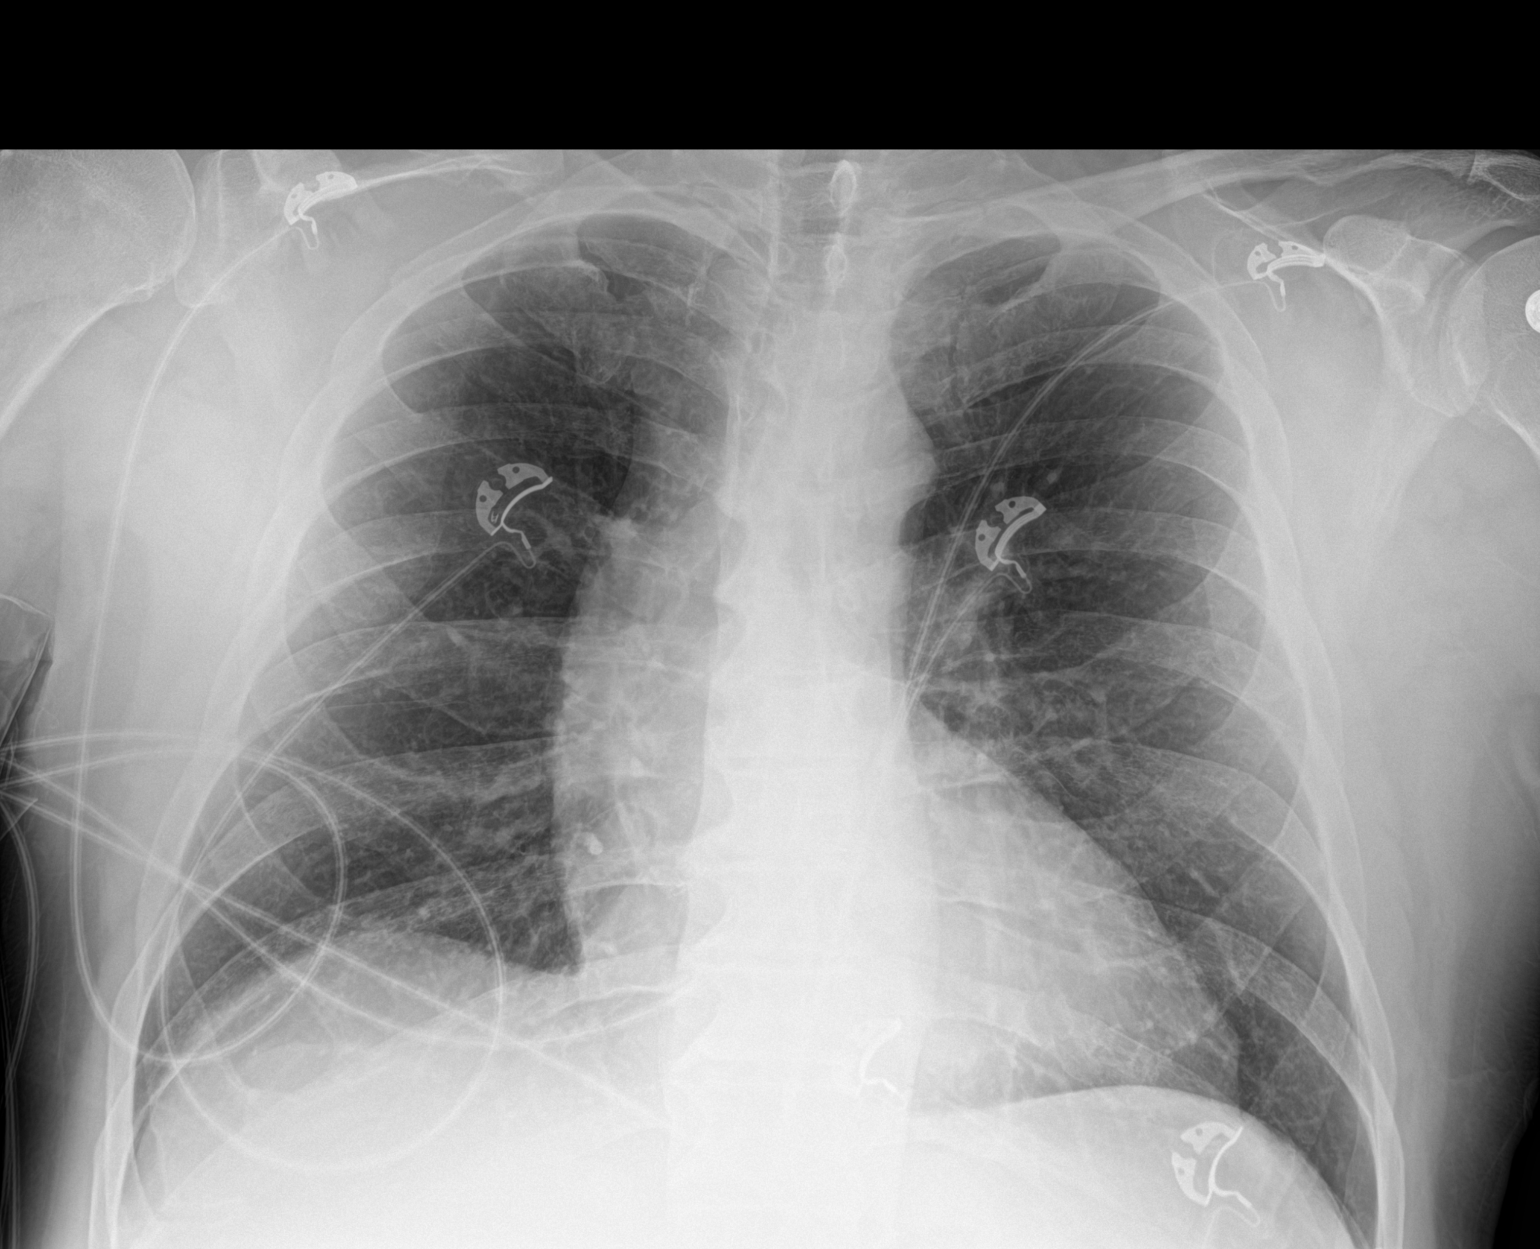

[1 of 1 positions shown; findings below may reference images not displayed]

FINDINGS: Heart is upper limits normal in size. Lungs clear. No effusions. No
acute bony abnormality. Old healed mid right clavicle fracture.
IMPRESSION: No active disease.

## 2018-10-17 MED ORDER — ACETAMINOPHEN 325 MG PO TABS
650.0000 mg | ORAL_TABLET | ORAL | Status: DC | PRN
  Administered 2018-10-17 – 2018-10-18 (×4): 650 mg via ORAL
  Filled 2018-10-17 (×4): qty 2

## 2018-10-17 MED ORDER — LORAZEPAM 1 MG PO TABS: 1.0000 mg | ORAL_TABLET | Freq: Four times a day (QID) | ORAL | Status: DC | PRN

## 2018-10-17 MED ORDER — ENOXAPARIN SODIUM 40 MG/0.4ML ~~LOC~~ SOLN
40.0000 mg | SUBCUTANEOUS | Status: DC
  Administered 2018-10-17 – 2018-10-18 (×2): 40 mg via SUBCUTANEOUS
  Filled 2018-10-17 (×2): qty 0.4

## 2018-10-17 MED ORDER — HYDRALAZINE HCL 20 MG/ML IJ SOLN
10.0000 mg | Freq: Once | INTRAMUSCULAR | Status: AC
  Administered 2018-10-17: 01:00:00 10 mg via INTRAVENOUS

## 2018-10-17 MED ORDER — ACETAMINOPHEN 500 MG PO TABS
1000.0000 mg | ORAL_TABLET | Freq: Once | ORAL | Status: AC
  Administered 2018-10-17: 04:00:00 1000 mg via ORAL
  Filled 2018-10-17: qty 2

## 2018-10-17 MED ORDER — LORAZEPAM 2 MG/ML IJ SOLN: 1.0000 mg | Freq: Four times a day (QID) | INTRAMUSCULAR | Status: DC | PRN

## 2018-10-17 MED ORDER — BUSPIRONE HCL 5 MG PO TABS
10.0000 mg | ORAL_TABLET | Freq: Two times a day (BID) | ORAL | Status: DC
  Administered 2018-10-17 – 2018-10-18 (×3): 10 mg via ORAL
  Filled 2018-10-17 (×3): qty 2

## 2018-10-17 MED ORDER — VITAMIN B-1 100 MG PO TABS
100.0000 mg | ORAL_TABLET | Freq: Every day | ORAL | Status: DC
  Administered 2018-10-17 – 2018-10-18 (×2): 100 mg via ORAL
  Filled 2018-10-17 (×2): qty 1

## 2018-10-17 MED ORDER — LORAZEPAM 2 MG/ML IJ SOLN
0.5000 mg | Freq: Four times a day (QID) | INTRAMUSCULAR | Status: DC | PRN
  Administered 2018-10-17: 17:00:00 0.5 mg via INTRAVENOUS
  Filled 2018-10-17: qty 1

## 2018-10-17 MED ORDER — FOLIC ACID 1 MG PO TABS
1.0000 mg | ORAL_TABLET | Freq: Every day | ORAL | Status: DC
  Administered 2018-10-17 – 2018-10-18 (×2): 1 mg via ORAL
  Filled 2018-10-17 (×2): qty 1

## 2018-10-17 MED ORDER — SERTRALINE HCL 100 MG PO TABS
100.0000 mg | ORAL_TABLET | Freq: Every day | ORAL | Status: DC
  Administered 2018-10-17 – 2018-10-18 (×2): 100 mg via ORAL
  Filled 2018-10-17 (×2): qty 1

## 2018-10-17 MED ORDER — IBUPROFEN 200 MG PO TABS
400.0000 mg | ORAL_TABLET | Freq: Four times a day (QID) | ORAL | Status: DC | PRN
  Administered 2018-10-17: 17:00:00 400 mg via ORAL
  Filled 2018-10-17: qty 2

## 2018-10-17 MED ORDER — THIAMINE HCL 100 MG/ML IJ SOLN
100.0000 mg | Freq: Every day | INTRAMUSCULAR | Status: DC
  Filled 2018-10-17: qty 2

## 2018-10-17 MED ORDER — ONDANSETRON HCL 4 MG/2ML IJ SOLN: 4.0000 mg | Freq: Four times a day (QID) | INTRAMUSCULAR | Status: DC | PRN

## 2018-10-17 MED ORDER — QUETIAPINE FUMARATE 25 MG PO TABS
50.0000 mg | ORAL_TABLET | Freq: Every day | ORAL | Status: DC
  Administered 2018-10-17: 50 mg via ORAL
  Filled 2018-10-17: qty 2

## 2018-10-17 MED ORDER — LISINOPRIL 20 MG PO TABS
20.0000 mg | ORAL_TABLET | Freq: Every day | ORAL | Status: DC
  Administered 2018-10-17 – 2018-10-18 (×2): 20 mg via ORAL
  Filled 2018-10-17 (×2): qty 1

## 2018-10-17 MED ORDER — HYDRALAZINE HCL 20 MG/ML IJ SOLN
10.0000 mg | Freq: Four times a day (QID) | INTRAMUSCULAR | Status: DC | PRN
  Administered 2018-10-17 (×2): 10 mg via INTRAVENOUS
  Filled 2018-10-17 (×2): qty 1

## 2018-10-17 MED ORDER — NITROGLYCERIN 0.4 MG SL SUBL
0.4000 mg | SUBLINGUAL_TABLET | SUBLINGUAL | Status: DC | PRN
  Administered 2018-10-17 (×2): 0.4 mg via SUBLINGUAL
  Filled 2018-10-17: qty 1

## 2018-10-17 MED ORDER — HYDRALAZINE HCL 20 MG/ML IJ SOLN
INTRAMUSCULAR | Status: AC
  Filled 2018-10-17: qty 1

## 2018-10-17 MED ORDER — ADULT MULTIVITAMIN W/MINERALS CH
1.0000 | ORAL_TABLET | Freq: Every day | ORAL | Status: DC
  Administered 2018-10-17 – 2018-10-18 (×2): 1 via ORAL
  Filled 2018-10-17 (×2): qty 1

## 2018-10-17 MED ORDER — MORPHINE SULFATE (PF) 2 MG/ML IV SOLN: 2.0000 mg | INTRAVENOUS | Status: DC | PRN

## 2018-10-17 MED ORDER — AMLODIPINE BESYLATE 10 MG PO TABS
10.0000 mg | ORAL_TABLET | Freq: Every day | ORAL | Status: DC
  Administered 2018-10-17 – 2018-10-18 (×2): 10 mg via ORAL
  Filled 2018-10-17 (×2): qty 1

## 2018-10-17 NOTE — ED Notes (Addendum)
ED TO INPATIENT HANDOFF REPORT  ED Nurse Name and Phone #: Caryl Pina [phone]  J Name/Age/Gender Frank [name] 68 y.o. male Room/Bed: 017C/017C  Code Status   Code Status: Prior  Home/SNF/Other Home Patient oriented to: self, place, time and situation Is this baseline? 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; pt denies pain on arrival-Monique,RN    Allergies No Known Allergies  Level of Care/Admitting Diagnosis ED Disposition    ED Disposition Condition [facility]: [facility] [[id number]]  Level of Care: Telemetry Cardiac [103]  I expect the patient will be discharged within 24 hours: Yes  LOW acuity---Tx typically complete <24 hrs---ACUTE conditions typically can be evaluated <24 hours---LABS likely to return to acceptable levels <24 hours---IS near functional baseline---EXPECTED to return to current living arrangement---NOT newly hypoxic: Meets criteria for 5C-Observation unit  Covid Evaluation: N/A  Diagnosis: Hypertensive urgency [497026]  Admitting Physician: Elwyn Reach [[id number]]  Attending Physician: Elwyn Reach [[id number]]  PT Class (Do Not Modify): Observation [104]  PT Acc Code (Do Not Modify): Observation [10022]       B Medical/Surgery History Past Medical History:  Diagnosis Date  . Anxiety   . Depression   . Drug addiction ([place])    History reviewed. No pertinent surgical history.   A IV Location/Drains/Wounds Patient Lines/Drains/Airways Status   Active Line/Drains/Airways    Name:   Placement date:   Placement time:   Site:   Days:   Peripheral IV 10/17/18 Right Antecubital    10/17/18    0144    Antecubital   less than 1          Intake/Output Last 24 hours No intake or output data in the 24 hours ending 10/17/18 0500  Labs/Imaging Results for orders placed or performed during the hospital encounter of 10/16/18 (from the past 48 hour(s))  CBC with Differential     Status: Abnormal   Collection Time: 10/17/18  1:12 AM  Result Value Ref Range   WBC 11.1 (H) 4.0 - 10.5 K/uL   RBC 5.83 (H) 4.22 - 5.81 MIL/uL   Hemoglobin 17.0 13.0 - 17.0 g/dL   HCT 49.8 39.0 - 52.0 %   MCV 85.4 80.0 - 100.0 fL   MCH 29.2 26.0 - 34.0 pg   MCHC 34.1 30.0 - 36.0 g/dL   RDW 13.1 11.5 - 15.5 %   Platelets 265 150 - 400 K/uL   nRBC 0.0 0.0 - 0.2 %   Neutrophils Relative % 75 %   Neutro Abs 8.3 (H) 1.7 - 7.7 K/uL   Lymphocytes Relative 15 %   Lymphs Abs 1.7 0.7 - 4.0 K/uL   Monocytes Relative 8 %   Monocytes Absolute 0.9 0.1 - 1.0 K/uL   Eosinophils Relative 1 %   Eosinophils Absolute 0.1 0.0 - 0.5 K/uL   Basophils Relative 1 %   Basophils Absolute 0.1 0.0 - 0.1 K/uL   Immature Granulocytes 0 %   Abs Immature Granulocytes 0.04 0.00 - 0.07 K/uL    Comment: Performed at [facility], [address], [place], [place]  [address]  Troponin I - Once     Status: None   Collection Time: 10/17/18  1:12 AM  Result Value Ref Range   Troponin I <0.03 <0.03 ng/mL    Comment: Performed at [facility] [address], [place], [place] [address]  Comprehensive metabolic panel     Status: Abnormal   Collection Time: 10/17/18  1:12 AM  Result Value Ref Range   Sodium 135 135 - 145 mmol/L   Potassium 3.8 3.5 - 5.1 mmol/L   Chloride 101 98 - 111 mmol/L   CO2 20 (L) 22 - 32 mmol/L   Glucose, Bld 101 (H) 70 - 99 mg/dL   BUN 16 8 - 23 mg/dL   Creatinine, Ser 0.84 0.61 - 1.24 mg/dL   Calcium 9.8 8.9 - 10.3 mg/dL   Total Protein 7.5 6.5 - 8.1 g/dL   Albumin 4.5 3.5 - 5.0 g/dL   AST 29 15 - 41 U/L   ALT 37 0 - 44 U/L   Alkaline Phosphatase 68 38 - 126 U/L   Total  Bilirubin 0.7 0.3 - 1.2 mg/dL   GFR calc non Af Amer >60 >60 mL/min   GFR calc Af Amer >60 >60 mL/min   Anion gap 14 5 - 15    Comment: Performed at [facility] [address] [place], [place] [address]  Lactic acid, plasma     Status: None   Collection Time: 10/17/18  1:12 AM  Result Value Ref Range   Lactic Acid, Venous 1.3 0.5 - 1.9 mmol/L    Comment: Performed at [facility] [address] [place], [place] [address]  SARS Coronavirus 2 [facility] order, Performed in [facility] hospital lab)     Status: None   Collection Time: 10/17/18  1:23 AM  Result Value Ref Range   SARS Coronavirus 2 NEGATIVE NEGATIVE    Comment: (NOTE) If result is NEGATIVE SARS-CoV-2 target nucleic acids are NOT DETECTED. The SARS-CoV-2 RNA is generally detectable in upper and lower  respiratory specimens during the acute phase of infection. The lowest  concentration of SARS-CoV-2 viral copies this assay can detect is 250  copies / mL. A negative result does not preclude SARS-CoV-2 infection  and should not be used as the sole basis for treatment or other  patient management decisions.  A negative result may occur with  improper specimen collection / handling, submission of specimen other  than nasopharyngeal swab, presence of viral mutation(s) within the  areas targeted by this assay, and inadequate number of viral copies  (<250 copies / mL). A negative result must be combined with clinical  observations, patient history, and epidemiological information. If result is POSITIVE SARS-CoV-2 target nucleic acids are DETECTED. The SARS-CoV-2 RNA is generally detectable in upper and lower  respiratory specimens dur ing the acute phase of infection.  Positive  results are indicative of active infection with SARS-CoV-2.  Clinical  correlation with patient history and other diagnostic information is  necessary to determine patient infection status.  Positive results do  not rule out bacterial  infection or co-infection with other viruses. If result is PRESUMPTIVE POSTIVE SARS-CoV-2 nucleic acids MAY BE PRESENT.   A presumptive positive result was obtained on the submitted specimen  and confirmed on repeat testing.  While 2019 novel coronavirus  (SARS-CoV-2) nucleic acids may be present in the submitted sample  additional confirmatory testing may be necessary for epidemiological  and / or clinical management purposes  to differentiate between  SARS-CoV-2 and other Sarbecovirus currently known to infect humans.  If clinically indicated additional testing with an alternate test  methodology [phone]) is advised. The SARS-CoV-2 RNA is generally  detectable in upper and lower respiratory sp ecimens during the acute  phase of infection. The expected result is Negative. Fact Sheet for Patients:  StrictlyIdeas.no Fact Sheet for Healthcare Providers: BankingDealers.co.za This test is not yet approved or cleared by the [place] FDA and has been authorized for detection and/or diagnosis of SARS-CoV-2 by FDA under an Emergency Use Authorization (EUA).  This EUA will remain in effect (meaning this test can be used) for the duration of the COVID-19 declaration under Section 564(b)(1) of the Act, 21 U.S.C. section 360bbb-3(b)(1), unless the authorization is terminated or revoked sooner. Performed at [facility], [place] [address], [place], [place] [address]   I-STAT 7, (LYTES, BLD GAS, ICA, H+H)     Status: Abnormal   Collection Time: 10/17/18  1:26 AM  Result Value Ref Range   pH, Arterial 7.416 7.350 - 7.450   pCO2 arterial 35.8 32.0 - 48.0 mmHg   pO2, Arterial 83.0 83.0 - 108.0 mmHg   Bicarbonate 23.0 20.0 - 28.0 mmol/L   TCO2 24 22 - 32 mmol/L   O2 Saturation 96.0 %   Acid-base deficit 1.0 0.0 - 2.0 mmol/L   Sodium 133 (L) 135 - 145 mmol/L   Potassium 3.9 3.5 - 5.1 mmol/L   Calcium, Ion 1.26 1.15 - 1.40 mmol/L   HCT 47.0  39.0 - 52.0 %   Hemoglobin 16.0 13.0 - 17.0 g/dL   Patient temperature 98.6 F    Collection site RADIAL, ALLEN'S TEST ACCEPTABLE    Drawn by RT    Sample type ARTERIAL   Urinalysis, Routine w reflex microscopic     Status: Abnormal   Collection Time: 10/17/18  2:30 AM  Result Value Ref Range   Color, Urine STRAW (A) YELLOW   APPearance CLEAR CLEAR   Specific Gravity, Urine 1.013 1.005 - 1.030   pH 6.0 5.0 - 8.0   Glucose, UA NEGATIVE NEGATIVE mg/dL   Hgb urine dipstick SMALL (A) NEGATIVE   Bilirubin Urine NEGATIVE NEGATIVE   Ketones, ur 5 (A) NEGATIVE mg/dL   Protein, ur NEGATIVE NEGATIVE mg/dL   Nitrite NEGATIVE NEGATIVE   Leukocytes,Ua NEGATIVE NEGATIVE   RBC / HPF 0-5 0 - 5 RBC/hpf   WBC, UA 0-5 0 - 5 WBC/hpf   Bacteria, UA NONE SEEN NONE SEEN   Mucus PRESENT     Comment: Performed at [facility], [address], [place], [place] [address]  Urine rapid drug screen (hosp performed)     Status: None   Collection Time: 10/17/18  2:30 AM  Result Value Ref Range   Opiates NONE DETECTED NONE DETECTED   Cocaine NONE DETECTED NONE DETECTED   Benzodiazepines NONE DETECTED NONE DETECTED   Amphetamines NONE DETECTED NONE DETECTED   Tetrahydrocannabinol NONE DETECTED NONE DETECTED   Barbiturates NONE DETECTED NONE DETECTED    Comment: (NOTE) DRUG SCREEN FOR MEDICAL PURPOSES ONLY.  IF CONFIRMATION IS NEEDED FOR ANY PURPOSE, NOTIFY LAB WITHIN 5 DAYS. LOWEST DETECTABLE LIMITS FOR URINE DRUG SCREEN Drug Class                     Cutoff (ng/mL) Amphetamine and metabolites    1000 Barbiturate and metabolites    200 Benzodiazepine  833 Tricyclics and metabolites     300 Opiates and metabolites        300 Cocaine and metabolites        300 THC                            50 Performed at [facility], [place] [address] [place], [place] [address]    Dg Chest Portable 1 View  Result Date: 10/17/2018 CLINICAL DATA:  Fever EXAM: PORTABLE CHEST 1 VIEW  COMPARISON:  None. FINDINGS: Heart is upper limits normal in size. Lungs clear. No effusions. No acute bony abnormality. Old healed mid right clavicle fracture. IMPRESSION: No active disease. Electronically Signed   By: Rolm Baptise M.D.   On: 10/17/2018 01:33    Pending Labs Unresulted Labs (From admission, onward)    Start     Ordered   10/17/18 0400  Troponin I - Once-Timed  Once-Timed,   STAT     10/17/18 3976   10/17/18 0114  Blood culture (routine x 2)  BLOOD CULTURE X 2,   STAT     10/17/18 0114   10/17/18 0056  Urine culture  ONCE - STAT,   STAT     10/17/18 0057   10/17/18 0055  Lactic acid, plasma  Now then every 2 hours,   STAT     10/17/18 0057   Signed and Held  HIV antibody (Routine Testing)  Once,   R     Signed and Held   Signed and Held  Troponin I - Now Then Q3H  Now then every 3 hours,   TIMED     Signed and Held   Signed and Held  CBC  (enoxaparin (LOVENOX)    CrCl >/= 30 ml/min)  Once,   R    Comments:  Baseline for enoxaparin therapy IF NOT ALREADY DRAWN.  Notify MD if PLT < 100 K.    Signed and Held   Signed and Held  Creatinine, serum  (enoxaparin (LOVENOX)    CrCl >/= 30 ml/min)  Once,   R    Comments:  Baseline for enoxaparin therapy IF NOT ALREADY DRAWN.    Signed and Held   Signed and Held  Creatinine, serum  (enoxaparin (LOVENOX)    CrCl >/= 30 ml/min)  Weekly,   R    Comments:  while on enoxaparin therapy    Signed and Held          Vitals/Pain Today's Vitals   10/17/18 0200 10/17/18 0300 10/17/18 0351 10/17/18 0406  BP: (!) 182/105 (!) 175/112 (!) 153/93   Pulse: 100 99 (!) 101   Resp: (!) 21 (!) 21 18   Temp:      TempSrc:      SpO2: 97% 97% 97%   Weight:      Height:      PainSc:    0-No pain    Isolation Precautions No active isolations  Medications Medications  nitroGLYCERIN (NITROSTAT) SL tablet 0.4 mg (0.4 mg Sublingual Given 10/17/18 0348)  hydrALAZINE (APRESOLINE) injection 10 mg (10 mg Intravenous Given 10/17/18 0117)   acetaminophen (TYLENOL) tablet 1,000 mg (1,000 mg Oral Given 10/17/18 0405)    Mobility walks Low fall risk   Focused Assessments Cardiac Assessment Handoff:  Cardiac Rhythm: Sinus tachycardia Lab Results  Component Value Date   TROPONINI <0.03 10/17/2018   No results found for: DDIMER Does the Patient currently have chest pain?  No     R Recommendations: See Admitting Provider Note  Report given to: [name]  Additional Notes: Negative covid.

## 2018-10-17 NOTE — Progress Notes (Signed)
Patient states that his headache is related to muscle tenssion and not having enough sleep last night. Pt also states: "ibuprofen helps with this headaches which I have couple times a month. I think is caused by muscle tenssion, stress and fatigue and I have all of that right now."   Tylenol  PRN given for headache. Will make MD aware that pt wants ibuprofen for headache.

## 2018-10-17 NOTE — TOC Initial Note (Signed)
Transition of Care [facility]) - Initial/Assessment Note    Patient Details  Name: Frank [name] MRN: [id number] Date of Birth: Jul 14, 1950  [facility]) CM/SW Contact:    Sherrilyn Rist Phone Number: [phone] 10/17/2018, 2:30 PM  Clinical Narrative:                 Patient is independent of his ADL's; no PCP, he is agreeable to have the CM assist him in establishing Primary Care; he is agreeable to [facility] [place] / Dr Jerilee Hoh - office to call the pt. Has private insurance with [facility] with prescription drug coverage; pharmacy of choice is [facility] on [address]; no DME.  Expected Discharge Plan: Home/Self Care Barriers to Discharge: No Barriers Identified   Patient Goals and CMS Choice Patient states their goals for this hospitalization and ongoing recovery are:: go back to work [employer].gov Compare Post Acute Care list provided to:: Patient Choice offered to / list presented to : NA  Expected Discharge Plan and Services Expected Discharge Plan: Home/Self Care In-house Referral: NA Discharge Planning Services: CM Consult, Follow-up appt scheduled([facility]- office to call the pt for apt with Dr Jerilee Hoh) Post Acute Care Choice: NA Living arrangements for the past 2 months: Single Family Home                 DME Arranged: N/A DME Agency: NA HH Arranged: NA [place] Agency: NA  Prior Living Arrangements/Services Living arrangements for the past 2 months: [place]   Patient language and need for interpreter reviewed:: Yes Do you feel safe going back to the place where you live?: Yes      Need for Family Participation in Patient Care: No (Comment) Care giver support system in place?: Yes (comment)   Criminal Activity/Legal Involvement Pertinent to Current Situation/Hospitalization: No - Comment as needed  Activities of Daily Living Home Assistive Devices/Equipment: None ADL  Screening (condition at time of admission) Patient's cognitive ability adequate to safely complete daily activities?: Yes Is the patient deaf or have difficulty hearing?: No Does the patient have difficulty seeing, even when wearing glasses/contacts?: No Does the patient have difficulty concentrating, remembering, or making decisions?: No Patient able to express need for assistance with ADLs?: Yes Does the patient have difficulty dressing or bathing?: No Independently performs ADLs?: Yes (appropriate for developmental age) Does the patient have difficulty walking or climbing stairs?: No Weakness of Legs: None Weakness of Arms/Hands: None  Permission Sought/Granted Permission sought to share information with : Case Manager       Permission granted to share info w AGENCY: PCP office        Emotional Assessment Appearance:: Developmentally appropriate Attitude/Demeanor/Rapport: Gracious Affect (typically observed): Accepting Orientation: : Oriented to Self, Oriented to  Time, Oriented to Place, Oriented to Situation Alcohol / Substance Use: Not Applicable Psych Involvement: No (comment)  Admission diagnosis:  Hypertensive urgency [I16.0] Patient Active Problem List   Diagnosis Date Noted  . Hypertensive urgency 10/17/2018  . Hyponatremia 10/17/2018  . ETOH abuse 10/17/2018  . MDD (major depressive disorder), recurrent severe, without psychosis ([place]) 08/15/2016  . MDD (major depressive disorder) 05/20/2012  . Cocaine abuse ([place]) 05/20/2012    Class: Chronic  . Chronic pain associated with significant psychosocial dysfunction 04/01/2012  . Anxiety   . Depression   . Drug addiction ([place])    PCP:  Patient, No Pcp Per Pharmacy:   [id number] - [place], [place] -  [place] [address] [place] [address] Phone: [phone] Fax: [phone]     Social Determinants of Health (SDOH) Interventions     Readmission Risk Interventions No flowsheet data found.

## 2018-10-17 NOTE — ED Provider Notes (Signed)
[facility] EMERGENCY DEPARTMENT Provider Note   CSN: [id number] Arrival date & time: 10/16/18  2246    History   Chief Complaint Chief Complaint  Patient presents with  . Fever    HPI Frank [name] is a 68 y.o. male.     Patient to ED with symptoms of low grade fever, and felt achy over the last 4 days. No significant congestion, no sore throat or cough. He started experiencing chest tightness tonight with SOB and lightheadedness tonight which prompted him to come in for evaluation. He states he is healthy and on no medications regularly. There has been no nausea or vomiting. He has been eating and drinking per his usual. No headache. He states he is concerned about coronavirus.   The history is provided by the patient. No language interpreter was used.    Past Medical History:  Diagnosis Date  . Anxiety   . Depression   . Drug addiction [facility])     Patient Active Problem List   Diagnosis Date Noted  . MDD (major depressive disorder), recurrent severe, without psychosis ([place]) 08/15/2016  . MDD (major depressive disorder) 05/20/2012  . Cocaine abuse ([place]) 05/20/2012    Class: Chronic  . Chronic pain associated with significant psychosocial dysfunction 04/01/2012  . Anxiety   . Depression   . Drug addiction ([place])     History reviewed. No pertinent surgical history.      Home Medications    Prior to Admission medications   Medication Sig Start Date End Date Taking? Authorizing Provider  acetaminophen (TYLENOL) 325 MG tablet Take 650 mg by mouth every 6 (six) hours as needed for fever.   Yes [provider]  busPIRone (BUSPAR) 15 MG tablet Take 1 tablet (15 mg total) by mouth 3 (three) times daily. For anxiety Patient not taking: Reported on 10/17/2018 09/03/16   Lindell Spar I, NP  gabapentin (NEURONTIN) 300 MG capsule Take 2 capsules (600 mg total) by mouth 3 (three) times daily. For agitation Patient not taking: Reported on 10/17/2018  09/03/16   Lindell Spar I, NP  mirtazapine (REMERON) 30 MG tablet Take 1 tablet (30 mg total) by mouth at bedtime. For depression.insomnia Patient not taking: Reported on 10/17/2018 09/03/16   Lindell Spar I, NP  QUEtiapine (SEROQUEL) 300 MG tablet Take 2 tablets (600 mg total) by mouth at bedtime. For mood control Patient not taking: Reported on 10/17/2018 09/03/16   Lindell Spar I, NP  sertraline (ZOLOFT) 100 MG tablet Take 2 tablets (200 mg total) by mouth daily. For depression Patient not taking: Reported on 10/17/2018 09/04/16   Lindell Spar I, NP  traZODone (DESYREL) 50 MG tablet Take 1 tablet (50 mg total) by mouth at bedtime as needed for sleep. Patient not taking: Reported on 10/17/2018 09/03/16   Encarnacion Slates, NP    Family History History reviewed. No pertinent family history.  Social History Social History   Tobacco Use  . Smoking status: Never Smoker  . Smokeless tobacco: Never Used  Substance Use Topics  . Alcohol use: Yes    Alcohol/week: 1.0 standard drinks    Types: 1 Cans of beer per week    Comment: PAIN PILLS--PERCOCET/VICODIN  . Drug use: Yes    Types: Other-see comments, "Crack" cocaine, Cocaine    Comment: smoking crack for the past week     Allergies   Patient has no known allergies.   Review of Systems Review of Systems  Constitutional: Positive for fever. Negative  for activity change, appetite change and diaphoresis.  HENT: Negative.  Negative for congestion and sore throat.   Respiratory: Positive for chest tightness and shortness of breath. Negative for cough.   Cardiovascular: Negative.   Gastrointestinal: Negative.  Negative for abdominal pain, nausea and vomiting.  Musculoskeletal: Positive for myalgias.  Skin: Negative.  Negative for rash.  Neurological: Positive for light-headedness. Negative for syncope and headaches.     Physical Exam Updated Vital Signs BP (!) 184/104   Pulse 98   Temp 99.2 F (37.3 C) (Oral)   Resp 16   Ht 5\' 8"  (1.727  m)   Wt 89.8 kg   SpO2 97%   BMI 30.11 kg/m   Physical Exam Vitals signs and nursing note reviewed.  Constitutional:      General: He is not in acute distress.    Appearance: Normal appearance. He is not ill-appearing.  HENT:     Mouth/Throat:     Mouth: Mucous membranes are moist.     Pharynx: Oropharynx is clear.  Eyes:     Conjunctiva/sclera: Conjunctivae normal.  Neck:     Musculoskeletal: Normal range of motion and neck supple.  Cardiovascular:     Rate and Rhythm: Regular rhythm. Tachycardia present.     Heart sounds: No murmur.  Pulmonary:     Effort: Pulmonary effort is normal.     Breath sounds: No wheezing, rhonchi or rales.  Abdominal:     General: There is no distension.     Palpations: Abdomen is soft.     Tenderness: There is no abdominal tenderness.  Musculoskeletal: Normal range of motion.     Right lower leg: No edema.     Left lower leg: No edema.  Skin:    General: Skin is warm and dry.  Neurological:     Mental Status: He is alert.      ED Treatments / Results  Labs (all labs ordered are listed, but only abnormal results are displayed) Labs Reviewed  CBC WITH DIFFERENTIAL/PLATELET - Abnormal; Notable for the following components:      Result Value   WBC 11.1 (*)    RBC 5.83 (*)    Neutro Abs 8.3 (*)    All other components within normal limits  POCT I-STAT 7, (LYTES, BLD GAS, ICA,H+H) - Abnormal; Notable for the following components:   Sodium 133 (*)    All other components within normal limits  URINE CULTURE  SARS CORONAVIRUS 2 (HOSPITAL ORDER, [place] [facility])  CULTURE, BLOOD (ROUTINE X 2)  CULTURE, BLOOD (ROUTINE X 2)  TROPONIN I  COMPREHENSIVE METABOLIC PANEL  LACTIC ACID, PLASMA  LACTIC ACID, PLASMA  URINALYSIS, ROUTINE W REFLEX MICROSCOPIC    EKG EKG Interpretation  Date/Time:  Tuesday October 17 2018 01:11:23 EDT Ventricular Rate:  95 PR Interval:    QRS Duration: 86 QT Interval:  344 QTC  Calculation: 433 R Axis:   -76 Text Interpretation:  Sinus rhythm Inferior infarct, old Consider anterior infarct No significant change since last tracing Confirmed by Pryor Curia [phone]) on 10/17/2018 1:32:26 AM   Radiology Dg Chest Portable 1 View  Result Date: 10/17/2018 CLINICAL DATA:  Fever EXAM: PORTABLE CHEST 1 VIEW COMPARISON:  None. FINDINGS: Heart is upper limits normal in size. Lungs clear. No effusions. No acute bony abnormality. Old healed mid right clavicle fracture. IMPRESSION: No active disease. Electronically Signed   By: Rolm Baptise M.D.   On: 10/17/2018 01:33    Procedures Procedures (including  critical care time)  Medications Ordered in ED Medications  hydrALAZINE (APRESOLINE) injection 10 mg (10 mg Intravenous Given 10/17/18 0117)     Initial Impression / Assessment and Plan / ED Course  I have reviewed the triage vital signs and the nursing notes.  Pertinent labs & imaging results that were available during my care of the patient were reviewed by me and considered in my medical decision making (see chart for details).    Hazen Brumett was evaluated in the Emergency Department on 10/17/18 for the symptoms described in the history of present illness. He was evaluated in the context of the global COVID-19 pandemic, which necessitated consideration that the patient might be at risk for infection with the SARS-CoV-2 virus that causes COVID-19. Institutional protocols and algorithms that pertain to the evaluation of patients at risk for COVID-19 are in a state of rapid change based on information released by regulatory bodies including the CDC and federal and state organizations. These policies and algorithms were followed during the patient's care in the ED.      Patient to ED with 4 days of mild myalgias and low grade temperature. Tonight he had constant, nonradiating chest tightness and SOB, associated with being lightheaded.   He is well appearing. He is found to be  significantly hypertensive. He reports that the last time he remembers having his blood pressure checked was about 6 months ago and is was "120's over 80's". He is on no medications regularly.   Given symptoms of chest tightness, low grade fever, SOB, COVID is considered in the differential and precautions taken. DDx: COVID vs other viral illness vs hypertension/cardiac consideration.  EKG reviewed by Dr. [name] Schanz and there is no significant change since last tracing. CXR is clear. Initial troponin 0.03.  HTN addressed with PO Hydralazine with some improvement to 178/110. He reports he is feeling better. COVID negative.   On re-examination - the patient states chest tightness is better but ongoing. Blood pressure is still markedly elevated. NTG SL ordered.  4:00 - He is asymptomatic after one nitroglycerin. Blood pressure is now 153/93. Second troponin pending. Feel the patient will need to be admitted for hypertensive urgency, and considered for provocative testing given his presentation.     Final Clinical Impressions(s) / ED Diagnoses   Final diagnoses:  None   1. Hypertensive urgency  ED Discharge Orders    None       Charlann Lange, PA-C 10/17/18 [place], Delice Bison, DO 10/17/18 [phone]

## 2018-10-17 NOTE — Progress Notes (Signed)
  Echocardiogram 2D Echocardiogram has been performed.  Jennette Dubin 10/17/2018, 9:37 AM

## 2018-10-17 NOTE — Care Management Obs Status (Signed)
[place] NOTIFICATION   Patient Details  Name: Frank [name] MRN: [id number] Date of Birth: [birth year]   Medicare Observation Status Notification Given:  Yes(telephone consent; all questions answered, letter explained in detail)    Royston Bake, RN 10/17/2018, 2:27 PM

## 2018-10-17 NOTE — Progress Notes (Addendum)
Attending MD note  Patient was seen, examined,treatment plan was discussed with the  Advance Practice Provider.  I have personally reviewed the clinical findings, lab,EKG, imaging studies and management of this patient in detail.I have also reviewed the orders written for this patient which were under my direction. I agree with the documentation, as recorded by the Advance Practice Provider.   Frank [name] is a 68 y.o. male with a Past Medical History of HTN, drug abuse, and depression/anxiety; who presents with complaints of low-grade fever, body aches, and chest pain.  Found to have hypertensive urgency  Reported not taking any previously prescribed medications that appear to have been for anxiety and depression.  Symptoms reminded him of a panic attack.  Starting some of patient's old anxiety medications, continue you blood pressure medications, and placed on CWIA protocols as patient reports drinking anywhere from 2 to 3 glasses of wine on a regular basis.  Cardiac work-up has been unremarkable and echocardiogram shows EF 60 to 65% with indeterminate regional wall motion abnormalities.    Frank Morton, MD Internal Medicine  pager [phone] [facility]      Admitted after midnight. See H&P  Briefly patient admitted with hypertensive urgency. Patient denies hx of htn but has not been to doctor in over 1 year. Hx of mood disorder, etoh, drug use and anxiety.  Had several days of low grade fever and developed sob. Became anxious and sob worsened. Concerned of covid. No hypoxia, afebrile on presentation (however he reports his baseline temp 98.0) virus negative, UDS negative. Lisinopril and norvasc started with prn hydralazine. Had not had dose of either at time of my exam  A/P  1 hypertensive urgency: Blood pressure is elevated.  No hx of same. Has not been to md in over 1 year. UDS negative. Echo results pending -continue lisinopril and norvasc -prn hydralazine -monitor  closely  #2 hyponatremia: mild. Provided with fluids.  -will recheck in am.  #3 anxiety with depression: denies. States not taking any of meds listed as "I dont need anymore"  #4 chest pain: resolved.  Most likely due to hypertensive urgency. Troponin neg x2. ekg with ST and possible atrial enlargement. chest xray negative.  Echo result pending.   We will cycle enzymes.  Monitor closely.  #5. ETOH use. Patient admits to 2 glasses of wine per day. Denies substance abuse for 8 years. UDS negative. No s/sx withdrawal -CIWA  Frank [name] m. Black, NP

## 2018-10-17 NOTE — Progress Notes (Signed)
Patient is alert and oriented to self, place and time.  Scheduled medication given.  vitals as follow:   10/17/18 0809  Vitals  BP (!) 189/105  MAP (mmHg) 130  BP Method Automatic  Pulse Rate 68  Pulse Rate Source Monitor  Oxygen Therapy  SpO2 98 %  MEWS Score  MEWS RR 0  MEWS Pulse 0  MEWS Systolic 0  MEWS LOC 0  MEWS Temp 0  MEWS Score 0  MEWS Score Color Green   PRN hydralazine given. Will check vitals in one hour and then every four hours after that.  Patient is currently laying down about to eat breakfast.

## 2018-10-17 NOTE — ED Notes (Signed)
Patient made aware that we need a UA

## 2018-10-17 NOTE — H&P (Signed)
History and Physical   Frank [name] FVC:[id number] DOB: 09/01/1950 DOA: 10/16/2018  Referring MD/NP/PA: Dr. Cyril Mourning Ward  PCP: Patient, No Pcp Per   Outpatient Specialists: None  Patient coming from: Home  Chief Complaint: Fever and chest pain  HPI: Frank [name] is a 68 y.o. male with medical history significant of hypertension, drug abuse, depression with anxiety, who has not been taking any medications came into the ER with low-grade fever generalized aches and chest pain.  Patient was found to have systolic blood pressure more than 638 and diastolic of more than 466.  Initiated on nitroglycerin and work-up for possible viral infections were which were negative.  Patient's symptoms improved with nitroglycerin.  Also given hydralazine which did not help much.  He was having some mild shortness of breath or lightheadedness.  No evidence of hypoxia or shortness of breath in the ER.  Patient being admitted with hypertensive urgency and chest pain evaluation..  ED Course: Temperature is 99.5, blood pressure is 217/122, pulse 111, respiratory of 22 oxygen sat 95% room air.  White count is 11.1 hemoglobin 16.0 platelet 265.  Sodium 133 with CO2 of 20 creatinine 0.84 and glucose 101.  Influenza a and COVID-19 testing negative.  Chest x-ray showed no active disease.  Urinalysis and urine drug screen were negative.  Patient being admitted for hypertensive urgency as well as chest pain.  Review of Systems: As per HPI otherwise 10 point review of systems negative.    Past Medical History:  Diagnosis Date  . Anxiety   . Depression   . Drug addiction ([place])     History reviewed. No pertinent surgical history.   reports that he has never smoked. He has never used smokeless tobacco. He reports current alcohol use of about 1.0 standard drinks of alcohol per week. He reports current drug use. Drugs: Other-see comments, "Crack" cocaine, and Cocaine.  No Known Allergies  History reviewed. No pertinent  family history.   Prior to Admission medications   Medication Sig Start Date End Date Taking? Authorizing Provider  acetaminophen (TYLENOL) 325 MG tablet Take 650 mg by mouth every 6 (six) hours as needed for fever.   Yes [provider]  busPIRone (BUSPAR) 15 MG tablet Take 1 tablet (15 mg total) by mouth 3 (three) times daily. For anxiety Patient not taking: Reported on 10/17/2018 09/03/16   Lindell Spar I, NP  gabapentin (NEURONTIN) 300 MG capsule Take 2 capsules (600 mg total) by mouth 3 (three) times daily. For agitation Patient not taking: Reported on 10/17/2018 09/03/16   Lindell Spar I, NP  mirtazapine (REMERON) 30 MG tablet Take 1 tablet (30 mg total) by mouth at bedtime. For depression.insomnia Patient not taking: Reported on 10/17/2018 09/03/16   Lindell Spar I, NP  QUEtiapine (SEROQUEL) 300 MG tablet Take 2 tablets (600 mg total) by mouth at bedtime. For mood control Patient not taking: Reported on 10/17/2018 09/03/16   Lindell Spar I, NP  sertraline (ZOLOFT) 100 MG tablet Take 2 tablets (200 mg total) by mouth daily. For depression Patient not taking: Reported on 10/17/2018 09/04/16   Lindell Spar I, NP  traZODone (DESYREL) 50 MG tablet Take 1 tablet (50 mg total) by mouth at bedtime as needed for sleep. Patient not taking: Reported on 10/17/2018 09/03/16   Lindell Spar I, NP    Physical Exam: Vitals:   10/17/18 0130 10/17/18 0200 10/17/18 0300 10/17/18 0351  BP: (!) 184/104 (!) 182/105 (!) 175/112 (!) 153/93  Pulse: 98 100  99 (!) 101  Resp: 16 (!) 21 (!) 21 18  Temp:      TempSrc:      SpO2: 97% 97% 97% 97%  Weight:      Height:          Constitutional: NAD, anxious, comfortable Vitals:   10/17/18 0130 10/17/18 0200 10/17/18 0300 10/17/18 0351  BP: (!) 184/104 (!) 182/105 (!) 175/112 (!) 153/93  Pulse: 98 100 99 (!) 101  Resp: 16 (!) 21 (!) 21 18  Temp:      TempSrc:      SpO2: 97% 97% 97% 97%  Weight:      Height:       Eyes: PERRL, lids and conjunctivae  normal ENMT: Mucous membranes are moist. Posterior pharynx clear of any exudate or lesions.Normal dentition.  Neck: normal, supple, no masses, no thyromegaly Respiratory: clear to auscultation bilaterally, no wheezing, no crackles. Normal respiratory effort. No accessory muscle use.  Cardiovascular: Regular rate and rhythm, no murmurs / rubs / gallops. No extremity edema. 2+ pedal pulses. No carotid bruits.  Abdomen: no tenderness, no masses palpated. No hepatosplenomegaly. Bowel sounds positive.  Musculoskeletal: no clubbing / cyanosis. No joint deformity upper and lower extremities. Good ROM, no contractures. Normal muscle tone.  Skin: no rashes, lesions, ulcers. No induration Neurologic: CN 2-12 grossly intact. Sensation intact, DTR normal. Strength 5/5 in all 4.  Psychiatric: Normal judgment and insight. Alert and oriented x 3. Normal mood.     Labs on Admission: I have personally reviewed following labs and imaging studies  CBC: Recent Labs  Lab 10/17/18 0112 10/17/18 0126  WBC 11.1*  --   NEUTROABS 8.3*  --   HGB 17.0 16.0  HCT 49.8 47.0  MCV 85.4  --   PLT 265  --    Basic Metabolic Panel: Recent Labs  Lab 10/17/18 0112 10/17/18 0126  NA 135 133*  K 3.8 3.9  CL 101  --   CO2 20*  --   GLUCOSE 101*  --   BUN 16  --   CREATININE 0.84  --   CALCIUM 9.8  --    GFR: Estimated Creatinine Clearance: 95.5 mL/min (by C-G formula based on SCr of 0.84 mg/dL). Liver Function Tests: Recent Labs  Lab 10/17/18 0112  AST 29  ALT 37  ALKPHOS 68  BILITOT 0.7  PROT 7.5  ALBUMIN 4.5   No results for input(s): LIPASE, AMYLASE in the last 168 hours. No results for input(s): AMMONIA in the last 168 hours. Coagulation Profile: No results for input(s): INR, PROTIME in the last 168 hours. Cardiac Enzymes: Recent Labs  Lab 10/17/18 0112  TROPONINI <0.03   BNP (last 3 results) No results for input(s): PROBNP in the last 8760 hours. HbA1C: No results for input(s): HGBA1C  in the last 72 hours. CBG: No results for input(s): GLUCAP in the last 168 hours. Lipid Profile: No results for input(s): CHOL, HDL, LDLCALC, TRIG, CHOLHDL, LDLDIRECT in the last 72 hours. Thyroid Function Tests: No results for input(s): TSH, T4TOTAL, FREET4, T3FREE, THYROIDAB in the last 72 hours. Anemia Panel: No results for input(s): VITAMINB12, FOLATE, FERRITIN, TIBC, IRON, RETICCTPCT in the last 72 hours. Urine analysis:    Component Value Date/Time   COLORURINE STRAW (A) 10/17/2018 0230   APPEARANCEUR CLEAR 10/17/2018 0230   LABSPEC 1.013 10/17/2018 0230   PHURINE 6.0 10/17/2018 0230   GLUCOSEU NEGATIVE 10/17/2018 0230   HGBUR SMALL (A) 10/17/2018 0230   BILIRUBINUR NEGATIVE 10/17/2018 0230  KETONESUR 5 (A) 10/17/2018 0230   PROTEINUR NEGATIVE 10/17/2018 0230   NITRITE NEGATIVE 10/17/2018 0230   LEUKOCYTESUR NEGATIVE 10/17/2018 0230   Sepsis Labs: @LABRCNTIP (procalcitonin:4,lacticidven:4) ) Recent Results (from the past 240 hour(s))  SARS Coronavirus 2 [facility] order, Performed in [place] hospital lab)     Status: None   Collection Time: 10/17/18  1:23 AM  Result Value Ref Range Status   SARS Coronavirus 2 NEGATIVE NEGATIVE Final    Comment: (NOTE) If result is NEGATIVE SARS-CoV-2 target nucleic acids are NOT DETECTED. The SARS-CoV-2 RNA is generally detectable in upper and lower  respiratory specimens during the acute phase of infection. The lowest  concentration of SARS-CoV-2 viral copies this assay can detect is 250  copies / mL. A negative result does not preclude SARS-CoV-2 infection  and should not be used as the sole basis for treatment or other  patient management decisions.  A negative result may occur with  improper specimen collection / handling, submission of specimen other  than nasopharyngeal swab, presence of viral mutation(s) within the  areas targeted by this assay, and inadequate number of viral copies  (<250 copies / mL). A negative result  must be combined with clinical  observations, patient history, and epidemiological information. If result is POSITIVE SARS-CoV-2 target nucleic acids are DETECTED. The SARS-CoV-2 RNA is generally detectable in upper and lower  respiratory specimens dur ing the acute phase of infection.  Positive  results are indicative of active infection with SARS-CoV-2.  Clinical  correlation with patient history and other diagnostic information is  necessary to determine patient infection status.  Positive results do  not rule out bacterial infection or co-infection with other viruses. If result is PRESUMPTIVE POSTIVE SARS-CoV-2 nucleic acids MAY BE PRESENT.   A presumptive positive result was obtained on the submitted specimen  and confirmed on repeat testing.  While 2019 novel coronavirus  (SARS-CoV-2) nucleic acids may be present in the submitted sample  additional confirmatory testing may be necessary for epidemiological  and / or clinical management purposes  to differentiate between  SARS-CoV-2 and other Sarbecovirus currently known to infect humans.  If clinically indicated additional testing with an alternate test  methodology [phone]) is advised. The SARS-CoV-2 RNA is generally  detectable in upper and lower respiratory sp ecimens during the acute  phase of infection. The expected result is Negative. Fact Sheet for Patients:  StrictlyIdeas.no Fact Sheet for Healthcare Providers: BankingDealers.co.za This test is not yet approved or cleared by the [place] FDA and has been authorized for detection and/or diagnosis of SARS-CoV-2 by FDA under an Emergency Use Authorization (EUA).  This EUA will remain in effect (meaning this test can be used) for the duration of the COVID-19 declaration under Section 564(b)(1) of the Act, 21 U.S.C. section 360bbb-3(b)(1), unless the authorization is terminated or revoked sooner. Performed at [facility], [place] [address], [place], [place] [address]      Radiological Exams on Admission: Dg Chest Portable 1 View  Result Date: 10/17/2018 CLINICAL DATA:  Fever EXAM: PORTABLE CHEST 1 VIEW COMPARISON:  None. FINDINGS: Heart is upper limits normal in size. Lungs clear. No effusions. No acute bony abnormality. Old healed mid right clavicle fracture. IMPRESSION: No active disease. Electronically Signed   By: Rolm Baptise M.D.   On: 10/17/2018 01:33    EKG: Independently reviewed.  It showed normal sinus rhythm with no significant ST changes.  Assessment/Plan Principal Problem:   Hypertensive urgency Active Problems:  Anxiety   Depression   Drug addiction ([place])   Hyponatremia     #1 hypertensive urgency: Blood pressure is elevated.  Will give nitroglycerin with initiation of oral antihypertensives.  Patient did not do well with hydralazine.  Initiate beta-blockers ACE inhibitors calcium channel blockers as needed.  He is negative for cocaine.  #2 hyponatremia: Hydrate and monitor.  #3 anxiety with depression: Continue home regimen.  #4 chest pain: Most likely due to hypertensive urgency.  We will cycle enzymes.  Monitor closely.   DVT prophylaxis: Lovenox Code Status: Full code Family Communication: Discussed with patient Disposition Plan: Home Consults called: None Admission status: Observation  Severity of Illness: The appropriate patient status for this patient is OBSERVATION. Observation status is judged to be reasonable and necessary in order to provide the required intensity of service to ensure the patient's safety. The patient's presenting symptoms, physical exam findings, and initial radiographic and laboratory data in the context of their medical condition is felt to place them at decreased risk for further clinical deterioration. Furthermore, it is anticipated that the patient will be medically stable for discharge from the hospital within 2 midnights of admission.  The following factors support the patient status of observation.   " The patient's presenting symptoms include chest pain and fever. " The physical exam findings include no significant findings. " The initial radiographic and laboratory data are elevated blood pressure.     Barbette Merino MD [facility] Pager [phone]  If 7PM-7AM, please contact night-coverage [email or web address] Password [email or web address]  10/17/2018, 4:50 AM

## 2018-10-18 DIAGNOSIS — I160 Hypertensive urgency: Secondary | ICD-10-CM | POA: Diagnosis not present

## 2018-10-18 LAB — BASIC METABOLIC PANEL
Anion gap: 11 (ref 5–15)
BUN: 20 mg/dL (ref 8–23)
CO2: 23 mmol/L (ref 22–32)
Calcium: 9.5 mg/dL (ref 8.9–10.3)
Chloride: 103 mmol/L (ref 98–111)
Creatinine, Ser: 1.4 mg/dL — ABNORMAL HIGH (ref 0.61–1.24)
GFR calc Af Amer: >60 mL/min (ref >60)
GFR calc non Af Amer: 52 mL/min — ABNORMAL LOW (ref >60)
Glucose, Bld: 119 mg/dL — ABNORMAL HIGH (ref 70–99)
Potassium: 4.3 mmol/L (ref 3.5–5.1)
Sodium: 137 mmol/L (ref 135–145)

## 2018-10-18 LAB — CBC
HCT: 48.7 % (ref 39.0–52.0)
Hemoglobin: 16.7 g/dL (ref 13.0–17.0)
MCH: 29.8 pg (ref 26.0–34.0)
MCHC: 34.3 g/dL (ref 30.0–36.0)
MCV: 87 fL (ref 80.0–100.0)
Platelets: 253 10*3/uL (ref 150–400)
RBC: 5.6 MIL/uL (ref 4.22–5.81)
RDW: 13.5 % (ref 11.5–15.5)
WBC: 10.3 10*3/uL (ref 4.0–10.5)
nRBC: 0 % (ref 0.0–0.2)

## 2018-10-18 LAB — URINE CULTURE: Culture: NO GROWTH

## 2018-10-18 MED ORDER — BUSPIRONE HCL 10 MG PO TABS: 10.0000 mg | ORAL_TABLET | Freq: Two times a day (BID) | ORAL | 0 refills | Status: DC

## 2018-10-18 MED ORDER — SERTRALINE HCL 100 MG PO TABS: 100.0000 mg | ORAL_TABLET | Freq: Every day | ORAL | 0 refills | Status: DC

## 2018-10-18 MED ORDER — AMLODIPINE BESYLATE 10 MG PO TABS: 10.0000 mg | ORAL_TABLET | Freq: Every day | ORAL | 0 refills | Status: DC

## 2018-10-18 MED ORDER — LISINOPRIL 20 MG PO TABS: 20.0000 mg | ORAL_TABLET | Freq: Every day | ORAL | 0 refills | Status: DC

## 2018-10-18 MED ORDER — QUETIAPINE FUMARATE 50 MG PO TABS: 50.0000 mg | ORAL_TABLET | Freq: Every day | ORAL | 0 refills | Status: DC

## 2018-10-18 MED FILL — QUETIAPINE FUMARATE 25 MG T: 25 | 30 days supply | Qty: 60 | Fill #0

## 2018-10-18 MED FILL — LISINOPRIL 20 MG TABLET: 20 | 30 days supply | Qty: 30 | Fill #0

## 2018-10-18 MED FILL — busPIRone HCL 10 MG TABS: 10 | 30 days supply | Qty: 60 | Fill #0

## 2018-10-18 MED FILL — AMLODIPINE BESYLATE 10 MG T: 10 | 30 days supply | Qty: 30 | Fill #0

## 2018-10-18 NOTE — Discharge Summary (Signed)
Physician Discharge Summary  CHILD CAMPOY OFB:[id number] DOB: 22-Feb-1951 DOA: 10/16/2018  PCP: Patient, No Pcp Per  Admit date: 10/16/2018 Discharge date: 10/18/2018  Admitted From: home  Discharge disposition: home   Recommendations for Outpatient Follow-Up:   1. Patient to establish with PCP 2. Further BP medication titration as an outpatient   Discharge Diagnosis:   Principal Problem:   Hypertensive urgency Active Problems:   Anxiety   Depression   Drug addiction ([place])   Hyponatremia   ETOH abuse    Discharge Condition: Improved.  Diet recommendation: Low sodium, heart healthy  Wound care: None.  Code status: Full.   History of Present Illness:   Frank [name] is a 68 y.o. male with a Past Medical History of HTN, drug abuse, and depression/anxiety; who presents with complaints of low-grade fever, body aches, and chest pain.  Found to have hypertensive urgency  Reported not taking any previously prescribed medications that appear to have been for anxiety and depression.  Symptoms reminded him of a panic attack.   Hospital Course by Problem:    hypertensive urgency: -Blood pressure is elevated. - Has not been to md in over 1 year. -continue lisinopril and norvasc -further outpatient adjustments  hyponatremia: mild. -resolved  anxiety with depression:  -resume seroquel and buspar -close outpatient follow up  chest pain: resolved. Most likely due to hypertensive urgency. Troponin neg x2.  -echo:   1. The left ventricle has normal systolic function with an ejection fraction of 60-65%. The cavity size was normal. Left ventricular diastolic Doppler parameters are indeterminate. No evidence of left ventricular regional wall motion abnormalities.  2. The right ventricle has normal systolic function. The cavity was normal. There is no increase in right ventricular wall thickness.  3. The aortic valve is tricuspid. Mild thickening of the aortic valve.  Mild calcification of the aortic valve. Aortic valve regurgitation is trivial by color flow Doppler.  4. The interatrial septum was not assessed.  ETOH use. Patient admits to 2 glasses of wine per day. -no signs of withdrawal    Medical Consultants:      Discharge Exam:   Vitals:   10/18/18 0006 10/18/18 0536  BP: 135/77 (!) 147/88  Pulse: 91 80  Resp: 18 18  Temp: 98.4 F (36.9 C) 98.2 F (36.8 C)  SpO2: 97% 98%   Vitals:   10/17/18 1807 10/17/18 1912 10/18/18 0006 10/18/18 0536  BP: (!) 160/89 (!) 163/95 135/77 (!) 147/88  Pulse: 80 89 91 80  Resp: 18 18 18 18   Temp:  98.6 F (37 C) 98.4 F (36.9 C) 98.2 F (36.8 C)  TempSrc:  Oral Oral Oral  SpO2: 92% 98% 97% 98%  Weight:    89.7 kg  Height:        General exam: Appears calm and comfortable.    The results of significant diagnostics from this hospitalization (including imaging, microbiology, ancillary and laboratory) are listed below for reference.     Procedures and Diagnostic Studies:   Dg Chest Portable 1 View  Result Date: 10/17/2018 CLINICAL DATA:  Fever EXAM: PORTABLE CHEST 1 VIEW COMPARISON:  None. FINDINGS: Heart is upper limits normal in size. Lungs clear. No effusions. No acute bony abnormality. Old healed mid right clavicle fracture. IMPRESSION: No active disease. Electronically Signed   By: Rolm Baptise M.D.   On: 10/17/2018 01:33     Labs:   Basic Metabolic Panel: Recent Labs  Lab 10/17/18 0112 10/17/18 0126  10/18/18 0423  NA 135 133* 137  K 3.8 3.9 4.3  CL 101  --  103  CO2 20*  --  23  GLUCOSE 101*  --  119*  BUN 16  --  20  CREATININE 0.84  --  1.40*  CALCIUM 9.8  --  9.5   GFR Estimated Creatinine Clearance: 57.2 mL/min (A) (by C-G formula based on SCr of 1.4 mg/dL (H)). Liver Function Tests: Recent Labs  Lab 10/17/18 0112  AST 29  ALT 37  ALKPHOS 68  BILITOT 0.7  PROT 7.5  ALBUMIN 4.5   No results for input(s): LIPASE, AMYLASE in the last 168 hours. No results  for input(s): AMMONIA in the last 168 hours. Coagulation profile No results for input(s): INR, PROTIME in the last 168 hours.  CBC: Recent Labs  Lab 10/17/18 0112 10/17/18 0126 10/18/18 0423  WBC 11.1*  --  10.3  NEUTROABS 8.3*  --   --   HGB 17.0 16.0 16.7  HCT 49.8 47.0 48.7  MCV 85.4  --  87.0  PLT 265  --  253   Cardiac Enzymes: Recent Labs  Lab 10/17/18 0112 10/17/18 0400 10/17/18 1156  TROPONINI <0.03 <0.03 <0.03   BNP: Invalid input(s): POCBNP CBG: No results for input(s): GLUCAP in the last 168 hours. D-Dimer No results for input(s): DDIMER in the last 72 hours. Hgb A1c No results for input(s): HGBA1C in the last 72 hours. Lipid Profile No results for input(s): CHOL, HDL, LDLCALC, TRIG, CHOLHDL, LDLDIRECT in the last 72 hours. Thyroid function studies No results for input(s): TSH, T4TOTAL, T3FREE, THYROIDAB in the last 72 hours.  Invalid input(s): FREET3 Anemia work up No results for input(s): VITAMINB12, FOLATE, FERRITIN, TIBC, IRON, RETICCTPCT in the last 72 hours. Microbiology Recent Results (from the past 240 hour(s))  SARS Coronavirus 2 [facility] order, Performed in [facility] hospital lab)     Status: None   Collection Time: 10/17/18  1:23 AM  Result Value Ref Range Status   SARS Coronavirus 2 NEGATIVE NEGATIVE Final    Comment: (NOTE) If result is NEGATIVE SARS-CoV-2 target nucleic acids are NOT DETECTED. The SARS-CoV-2 RNA is generally detectable in upper and lower  respiratory specimens during the acute phase of infection. The lowest  concentration of SARS-CoV-2 viral copies this assay can detect is 250  copies / mL. A negative result does not preclude SARS-CoV-2 infection  and should not be used as the sole basis for treatment or other  patient management decisions.  A negative result may occur with  improper specimen collection / handling, submission of specimen other  than nasopharyngeal swab, presence of viral mutation(s) within the   areas targeted by this assay, and inadequate number of viral copies  (<250 copies / mL). A negative result must be combined with clinical  observations, patient history, and epidemiological information. If result is POSITIVE SARS-CoV-2 target nucleic acids are DETECTED. The SARS-CoV-2 RNA is generally detectable in upper and lower  respiratory specimens dur ing the acute phase of infection.  Positive  results are indicative of active infection with SARS-CoV-2.  Clinical  correlation with patient history and other diagnostic information is  necessary to determine patient infection status.  Positive results do  not rule out bacterial infection or co-infection with other viruses. If result is PRESUMPTIVE POSTIVE SARS-CoV-2 nucleic acids MAY BE PRESENT.   A presumptive positive result was obtained on the submitted specimen  and confirmed on repeat testing.  While 2019 novel coronavirus  (  SARS-CoV-2) nucleic acids may be present in the submitted sample  additional confirmatory testing may be necessary for epidemiological  and / or clinical management purposes  to differentiate between  SARS-CoV-2 and other Sarbecovirus currently known to infect humans.  If clinically indicated additional testing with an alternate test  methodology [phone]) is advised. The SARS-CoV-2 RNA is generally  detectable in upper and lower respiratory sp ecimens during the acute  phase of infection. The expected result is Negative. Fact Sheet for Patients:  StrictlyIdeas.no Fact Sheet for Healthcare Providers: BankingDealers.co.za This test is not yet approved or cleared by the [place] FDA and has been authorized for detection and/or diagnosis of SARS-CoV-2 by FDA under an Emergency Use Authorization (EUA).  This EUA will remain in effect (meaning this test can be used) for the duration of the COVID-19 declaration under Section 564(b)(1) of the Act, 21  U.S.C. section 360bbb-3(b)(1), unless the authorization is terminated or revoked sooner. Performed at [facility], [place] [address], [place], [place] [address]   Urine culture     Status: None   Collection Time: 10/17/18  2:30 AM  Result Value Ref Range Status   Specimen Description URINE, CLEAN CATCH  Final   Special Requests NONE  Final   Culture   Final    NO GROWTH Performed at [facility], [place] [address], [place], [place] [address]    Report Status 10/18/2018 FINAL  Final     Discharge Instructions:   Discharge Instructions    Diet - low sodium heart healthy   Complete by:  As directed    Increase activity slowly   Complete by:  As directed      Allergies as of 10/18/2018   No Known Allergies     Medication List    STOP taking these medications   gabapentin 300 MG capsule Commonly known as:  NEURONTIN   mirtazapine 30 MG tablet Commonly known as:  REMERON   traZODone 50 MG tablet Commonly known as:  DESYREL     TAKE these medications   acetaminophen 325 MG tablet Commonly known as:  TYLENOL Take 650 mg by mouth every 6 (six) hours as needed for fever.   amLODipine 10 MG tablet Commonly known as:  NORVASC Take 1 tablet (10 mg total) by mouth daily. Start taking on:  October 19, 2018   busPIRone 10 MG tablet Commonly known as:  BUSPAR Take 1 tablet (10 mg total) by mouth 2 (two) times daily. What changed:    medication strength  how much to take  when to take this  additional instructions   lisinopril 20 MG tablet Commonly known as:  ZESTRIL Take 1 tablet (20 mg total) by mouth daily. Start taking on:  October 19, 2018   QUEtiapine 50 MG tablet Commonly known as:  SEROQUEL Take 1 tablet (50 mg total) by mouth at bedtime. What changed:    medication strength  how much to take  additional instructions   sertraline 100 MG tablet Commonly known as:  ZOLOFT Take 1 tablet (100 mg total) by mouth daily. Start taking on:  October 19, 2018 What changed:    how much to take  additional instructions         Time coordinating discharge: 25 min  Signed:  Geradine Girt DO  [facility] 10/18/2018, 8:45 AM

## 2018-10-18 NOTE — Progress Notes (Signed)
Patient alert oriented denies pain, v/s stable. IV and tele d/c. D/c instruction explain and given to the patient, all questions answered, pt. Verbalized understanding. D/c pt home per order

## 2018-10-18 NOTE — Discharge Instructions (Signed)
Preventing Hypertension Hypertension, commonly called high blood pressure, is when the force of blood pumping through the arteries is too strong. Arteries are blood vessels that carry blood from the heart throughout the body. Over time, hypertension can damage the arteries and decrease blood flow to important parts of the body, including the brain, heart, and kidneys. Often, hypertension does not cause symptoms until blood pressure is very high. For this reason, it is important to have your blood pressure checked on a regular basis. Hypertension can often be prevented with diet and lifestyle changes. If you already have hypertension, you can control it with diet and lifestyle changes, as well as medicine. What nutrition changes can be made? Maintain a healthy diet. This includes:  Eating less salt (sodium). Ask your health care provider how much sodium is safe for you to have. The general recommendation is to consume less than 1 tsp (2,300 mg) of sodium a day. ? Do not add salt to your food. ? Choose low-sodium options when grocery shopping and eating out.  Limiting fats in your diet. You can do this by eating low-fat or fat-free dairy products and by eating less red meat.  Eating more fruits, vegetables, and whole grains. Make a goal to eat: ? 1-2 cups of fresh fruits and vegetables each day. ? 3-4 servings of whole grains each day.  Avoiding foods and beverages that have added sugars.  Eating fish that contain healthy fats (omega-3 fatty acids), such as mackerel or salmon. If you need help putting together a healthy eating plan, try the DASH diet. This diet is high in fruits, vegetables, and whole grains. It is low in sodium, red meat, and added sugars. DASH stands for Dietary Approaches to Stop Hypertension. What lifestyle changes can be made?   Lose weight if you are overweight. Losing just 3?5% of your body weight can help prevent or control hypertension. ? For example, if your present  weight is 200 lb (91 kg), a loss of 3-5% of your weight means losing 6-10 lb (2.7-4.5 kg). ? Ask your health care provider to help you with a diet and exercise plan to safely lose weight.  Get enough exercise. Do at least 150 minutes of moderate-intensity exercise each week. ? You could do this in short exercise sessions several times a day, or you could do longer exercise sessions a few times a week. For example, you could take a brisk 10-minute walk or bike ride, 3 times a day, for 5 days a week.  Find ways to reduce stress, such as exercising, meditating, listening to music, or taking a yoga class. If you need help reducing stress, ask your health care provider.  Do not smoke. This includes e-cigarettes. Chemicals in tobacco and nicotine products raise your blood pressure each time you smoke. If you need help quitting, ask your health care provider.  Avoid alcohol. If you drink alcohol, limit alcohol intake to no more than 1 drink a day for nonpregnant women and 2 drinks a day for men. One drink equals 12 oz of beer, 5 oz of wine, or 1 oz of hard liquor. Why are these changes important? Diet and lifestyle changes can help you prevent hypertension, and they may make you feel better overall and improve your quality of life. If you have hypertension, making these changes will help you control it and help prevent major complications, such as:  Hardening and narrowing of arteries that supply blood to: ? Your heart. This can cause a heart  it and help prevent major complications, such as:   Hardening and narrowing of arteries that supply blood to:  ? Your heart. This can cause a heart attack.  ? Your brain. This can cause a stroke.  ? Your kidneys. This can cause kidney failure.   Stress on your heart muscle, which can cause heart failure.  What can I do to lower my risk?   Work with your health care provider to make a hypertension prevention plan that works for you. Follow your plan and keep all follow-up visits as told by your health care provider.   Learn how to check your blood pressure at home. Make sure that you know your personal target  blood pressure, as told by your health care provider.  How is this treated?  In addition to diet and lifestyle changes, your health care provider may recommend medicines to help lower your blood pressure. You may need to try a few different medicines to find what works best for you. You also may need to take more than one medicine. Take over-the-counter and prescription medicines only as told by your health care provider.  Where to find support  Your health care provider can help you prevent hypertension and help you keep your blood pressure at a healthy level. Your local hospital or your community may also provide support services and prevention programs.  The American Heart Association offers an online support network at: http://supportnetwork.heart.org/high-blood-pressure  Where to find more information  Learn more about hypertension from:   National Heart, Lung, and Blood Institute: www.nhlbi.nih.gov/health/health-topics/topics/hbp   Centers for Disease Control and Prevention: www.cdc.gov/bloodpressure   American Academy of Family Physicians: http://familydoctor.org/familydoctor/en/diseases-conditions/high-blood-pressure.printerview.all.html  Learn more about the DASH diet from:   National Heart, Lung, and Blood Institute: www.nhlbi.nih.gov/health/health-topics/topics/dash  Contact a health care provider if:   You think you are having a reaction to medicines you have taken.   You have recurrent headaches or feel dizzy.   You have swelling in your ankles.   You have trouble with your vision.  Summary   Hypertension often does not cause any symptoms until blood pressure is very high. It is important to get your blood pressure checked regularly.   Diet and lifestyle changes are the most important steps in preventing hypertension.   By keeping your blood pressure in a healthy range, you can prevent complications like heart attack, heart failure, stroke, and kidney failure.   Work with your health care  provider to make a hypertension prevention plan that works for you.  This information is not intended to replace advice given to you by your health care provider. Make sure you discuss any questions you have with your health care provider.  Document Released: 06/29/2015 Document Revised: 02/23/2016 Document Reviewed: 02/23/2016  Elsevier Interactive Patient Education  2019 Elsevier Inc.

## 2018-10-22 LAB — CULTURE, BLOOD (ROUTINE X 2)
Culture: NO GROWTH
Culture: NO GROWTH
Special Requests: ADEQUATE

## 2018-10-26 ENCOUNTER — Other Ambulatory Visit: Payer: Self-pay

## 2018-10-26 ENCOUNTER — Ambulatory Visit (INDEPENDENT_AMBULATORY_CARE_PROVIDER_SITE_OTHER): Payer: Medicare Other | Admitting: Adult Health

## 2018-10-26 ENCOUNTER — Encounter: Payer: Self-pay | Admitting: Adult Health

## 2018-10-26 DIAGNOSIS — E782 Mixed hyperlipidemia: Secondary | ICD-10-CM | POA: Diagnosis not present

## 2018-10-26 DIAGNOSIS — Z7689 Persons encountering health services in other specified circumstances: Secondary | ICD-10-CM

## 2018-10-26 DIAGNOSIS — I10 Essential (primary) hypertension: Secondary | ICD-10-CM | POA: Diagnosis not present

## 2018-10-26 DIAGNOSIS — I160 Hypertensive urgency: Secondary | ICD-10-CM

## 2018-10-26 DIAGNOSIS — R7309 Other abnormal glucose: Secondary | ICD-10-CM | POA: Insufficient documentation

## 2018-10-26 DIAGNOSIS — F32A Depression, unspecified: Secondary | ICD-10-CM

## 2018-10-26 DIAGNOSIS — F5101 Primary insomnia: Secondary | ICD-10-CM | POA: Insufficient documentation

## 2018-10-26 DIAGNOSIS — F329 Major depressive disorder, single episode, unspecified: Secondary | ICD-10-CM | POA: Diagnosis not present

## 2018-10-26 DIAGNOSIS — Z1211 Encounter for screening for malignant neoplasm of colon: Secondary | ICD-10-CM

## 2018-10-26 MED ORDER — LISINOPRIL 20 MG PO TABS: 20.0000 mg | ORAL_TABLET | Freq: Every day | ORAL | 1 refills | Status: DC

## 2018-10-26 MED ORDER — QUETIAPINE FUMARATE 50 MG PO TABS: 50.0000 mg | ORAL_TABLET | Freq: Every day | ORAL | 1 refills | Status: DC

## 2018-10-26 MED ORDER — AMLODIPINE BESYLATE 10 MG PO TABS: 10.0000 mg | ORAL_TABLET | Freq: Every day | ORAL | 1 refills | Status: DC

## 2018-10-26 NOTE — Progress Notes (Signed)
Virtual Visit via Video Note  I connected withLee P [name] on 10/26/18 at  4:00 PM EDT by a video enabled telemedicine application and verified that I am speaking with the correct person using two identifiers.  Location patient: home Location provider:work or home office Persons participating in the virtual visit: patient, provider  I discussed the limitations of evaluation and management by telemedicine and the availability of in person appointments. The patient expressed understanding and agreed to proceed.   Establish Care and TCM visit.  Report ports that he has been without medical care for numerous years due to not having any health insurance.    Acute Concerns: Establish Care/TCM visit   Chronic Issues: Essential Hypertension -he was admitted to [facility] on 10/16/2018 and discharged on 10/18/2018 for hypertensive emergency.  He originally came to the ER with low-grade fever, generalized body aches, and chest pain.  He was found to have a systolic blood pressure of more than 154 and diastolic of more than 008.  He was initiated on nitroglycerin and work-up for possible viral infection which was negative.  His symptoms improved while on nitroglycerin.  He was also given hydralazine which did not help much.  Was admitted for hypertensive emergency and chest pain evaluation.  On discharge she was prescribed amlodipine 10 mg tablets daily as well as lisinopril 20 mg daily.  He reports no known history of having hypertension in the past.  Has been monitoring his blood pressures at home and reports varying readings but mostly in the 140s over 90s.  Blood work did show some mild creased renal function.  Drug Abuse -has been clean for approximately 7 years.  Had issues with crack cocaine in the past.  Reports no recent drug use  Insomnia -takes Seroquel 50 mg nightly.  Reports that this helps him sleep.  He does need a refill on this medication  Anxiety -was prescribed BuSpar  many years ago but has not taken it in the last 2 to 3 years.  He reports no issues with anxiety or depression currently and he has been seen by [facility] in the past  Hyperlipidemia -has been told in the past that he has elevated cholesterol levels.  Has never taken any medication for this Lab Results  Component Value Date   CHOL 330 (H) 04/02/2012   HDL 36 (L) 04/02/2012   LDLCALC 245 (H) 04/02/2012   TRIG 244 (H) 04/02/2012   CHOLHDL 9.2 04/02/2012     Health Maintenance: Dental -- Not routine  Vision -- Not routine  Immunizations -- Needs PNA series Colonoscopy -- Never had    Past Medical History:  Diagnosis Date  . Anxiety   . Depression   . Drug addiction ([facility])   . ETOH abuse   . High cholesterol   . Hypertensive urgency     Past Surgical History:  Procedure Laterality Date  . SKIN CANCER EXCISION  2015  . TONSILLECTOMY      Current Outpatient Medications on File Prior to Visit  Medication Sig Dispense Refill  . acetaminophen (TYLENOL) 325 MG tablet Take 650 mg by mouth every 6 (six) hours as needed for fever.     No current facility-administered medications on file prior to visit.     No Known Allergies  Family History  Problem Relation Age of Onset  . Breast cancer Mother        Died at 63  . Lung cancer Father   . Prostate cancer Father  Died at 41     Social History   Socioeconomic History  . Marital status: Divorced    Spouse name: Not on file  . Number of children: Not on file  . Years of education: Not on file  . Highest education level: Not on file  Occupational History  . Not on file  Social Needs  . Financial resource strain: Not on file  . Food insecurity:    Worry: Not on file    Inability: Not on file  . Transportation needs:    Medical: Not on file    Non-medical: Not on file  Tobacco Use  . Smoking status: Never Smoker  . Smokeless tobacco: Never Used  Substance and Sexual Activity  . Alcohol use: Yes    Comment:  social history   . Drug use: Not Currently    Types: Other-see comments, "Crack" cocaine, Cocaine    Comment: smoking crack for the past week  . Sexual activity: Not on file    Comment: oxycontin  Lifestyle  . Physical activity:    Days per week: Not on file    Minutes per session: Not on file  . Stress: Not on file  Relationships  . Social connections:    Talks on phone: Not on file    Gets together: Not on file    Attends religious service: Not on file    Active member of club or organization: Not on file    Attends meetings of clubs or organizations: Not on file    Relationship status: Not on file  . Intimate partner violence:    Fear of current or ex partner: Not on file    Emotionally abused: Not on file    Physically abused: Not on file    Forced sexual activity: Not on file  Other Topics Concern  . 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     ROS  There were no vitals taken for this visit.  Physical Exam VITALS per patient if applicable:  GENERAL: alert, oriented, appears well and in no acute distress  HEENT: atraumatic, conjunttiva clear, no obvious abnormalities on inspection of external nose and ears  NECK: normal movements of the head and neck  LUNGS: on inspection no signs of respiratory distress, breathing rate appears normal, no obvious gross SOB, gasping or wheezing  CV: no obvious cyanosis  MS: moves all visible extremities without noticeable abnormality  PSYCH/NEURO: pleasant and cooperative, no obvious depression or anxiety, speech and thought processing grossly intact  Recent Results (from the past 2160 hour(s))  Blood culture (routine x 2)     Status: None   Collection Time: 10/17/18  1:00 AM  Result Value Ref Range   Specimen Description BLOOD RIGHT ARM    Special Requests      BOTTLES DRAWN AEROBIC AND ANAEROBIC Blood Culture results may not be  optimal due to an excessive volume of blood received in culture bottles   Culture      NO GROWTH 5 DAYS Performed at [facility] [address], [place], [place] [address]    Report Status 10/22/2018 FINAL   CBC with Differential     Status: Abnormal   Collection Time: 10/17/18  1:12 AM  Result Value Ref Range   WBC 11.1 (H) 4.0 - 10.5 K/uL   RBC 5.83 (H) 4.22 -  5.81 MIL/uL   Hemoglobin 17.0 13.0 - 17.0 g/dL   HCT 49.8 39.0 - 52.0 %   MCV 85.4 80.0 - 100.0 fL   MCH 29.2 26.0 - 34.0 pg   MCHC 34.1 30.0 - 36.0 g/dL   RDW 13.1 11.5 - 15.5 %   Platelets 265 150 - 400 K/uL   nRBC 0.0 0.0 - 0.2 %   Neutrophils Relative % 75 %   Neutro Abs 8.3 (H) 1.7 - 7.7 K/uL   Lymphocytes Relative 15 %   Lymphs Abs 1.7 0.7 - 4.0 K/uL   Monocytes Relative 8 %   Monocytes Absolute 0.9 0.1 - 1.0 K/uL   Eosinophils Relative 1 %   Eosinophils Absolute 0.1 0.0 - 0.5 K/uL   Basophils Relative 1 %   Basophils Absolute 0.1 0.0 - 0.1 K/uL   Immature Granulocytes 0 %   Abs Immature Granulocytes 0.04 0.00 - 0.07 K/uL    Comment: Performed at [facility] [address] [place], [place] [address]  Troponin I - Once     Status: None   Collection Time: 10/17/18  1:12 AM  Result Value Ref Range   Troponin I <0.03 <0.03 ng/mL    Comment: Performed at [facility] [address] [place], [place] [address]  Comprehensive metabolic panel     Status: Abnormal   Collection Time: 10/17/18  1:12 AM  Result Value Ref Range   Sodium 135 135 - 145 mmol/L   Potassium 3.8 3.5 - 5.1 mmol/L   Chloride 101 98 - 111 mmol/L   CO2 20 (L) 22 - 32 mmol/L   Glucose, Bld 101 (H) 70 - 99 mg/dL   BUN 16 8 - 23 mg/dL   Creatinine, Ser 0.84 0.61 - 1.24 mg/dL   Calcium 9.8 8.9 - 10.3 mg/dL   Total Protein 7.5 6.5 - 8.1 g/dL   Albumin 4.5 3.5 - 5.0 g/dL   AST 29 15 - 41 U/L   ALT 37 0 - 44 U/L   Alkaline Phosphatase 68 38 - 126 U/L   Total Bilirubin 0.7 0.3 - 1.2 mg/dL   GFR calc non Af Amer >60 >60  mL/min   GFR calc Af Amer >60 >60 mL/min   Anion gap 14 5 - 15    Comment: Performed at [facility] [address] [place], [place] [address]  Lactic acid, plasma     Status: None   Collection Time: 10/17/18  1:12 AM  Result Value Ref Range   Lactic Acid, Venous 1.3 0.5 - 1.9 mmol/L    Comment: Performed at [facility] [address], [place], [place] [address]  Blood culture (routine x 2)     Status: None   Collection Time: 10/17/18  1:20 AM  Result Value Ref Range   Specimen Description BLOOD RIGHT HAND    Special Requests      BOTTLES DRAWN AEROBIC ONLY Blood Culture adequate volume   Culture      NO GROWTH 5 DAYS Performed at [facility], [place] [address], [place], [place] [address]    Report Status 10/22/2018 FINAL   SARS Coronavirus 2 [facility] order, Performed in [place] hospital lab)     Status: None   Collection Time: 10/17/18  1:23 AM  Result Value Ref Range   SARS Coronavirus 2 NEGATIVE NEGATIVE    Comment: (NOTE) If result is NEGATIVE SARS-CoV-2 target nucleic acids are NOT DETECTED. The SARS-CoV-2  RNA is generally detectable in upper and lower  respiratory specimens during the acute phase of infection. The lowest  concentration of SARS-CoV-2 viral copies this assay can detect is 250  copies / mL. A negative result does not preclude SARS-CoV-2 infection  and should not be used as the sole basis for treatment or other  patient management decisions.  A negative result may occur with  improper specimen collection / handling, submission of specimen other  than nasopharyngeal swab, presence of viral mutation(s) within the  areas targeted by this assay, and inadequate number of viral copies  (<250 copies / mL). A negative result must be combined with clinical  observations, patient history, and epidemiological information. If result is POSITIVE SARS-CoV-2 target nucleic acids are DETECTED. The SARS-CoV-2 RNA is generally detectable in upper and  lower  respiratory specimens dur ing the acute phase of infection.  Positive  results are indicative of active infection with SARS-CoV-2.  Clinical  correlation with patient history and other diagnostic information is  necessary to determine patient infection status.  Positive results do  not rule out bacterial infection or co-infection with other viruses. If result is PRESUMPTIVE POSTIVE SARS-CoV-2 nucleic acids MAY BE PRESENT.   A presumptive positive result was obtained on the submitted specimen  and confirmed on repeat testing.  While 2019 novel coronavirus  (SARS-CoV-2) nucleic acids may be present in the submitted sample  additional confirmatory testing may be necessary for epidemiological  and / or clinical management purposes  to differentiate between  SARS-CoV-2 and other Sarbecovirus currently known to infect humans.  If clinically indicated additional testing with an alternate test  methodology [phone]) is advised. The SARS-CoV-2 RNA is generally  detectable in upper and lower respiratory sp ecimens during the acute  phase of infection. The expected result is Negative. Fact Sheet for Patients:  StrictlyIdeas.no Fact Sheet for Healthcare Providers: BankingDealers.co.za This test is not yet approved or cleared by the [place] FDA and has been authorized for detection and/or diagnosis of SARS-CoV-2 by FDA under an Emergency Use Authorization (EUA).  This EUA will remain in effect (meaning this test can be used) for the duration of the COVID-19 declaration under Section 564(b)(1) of the Act, 21 U.S.C. section 360bbb-3(b)(1), unless the authorization is terminated or revoked sooner. Performed at [facility], [place] [address], [place], [place] [address]   I-STAT 7, (LYTES, BLD GAS, ICA, H+H)     Status: Abnormal   Collection Time: 10/17/18  1:26 AM  Result Value Ref Range   pH, Arterial 7.416 7.350 - 7.450   pCO2  arterial 35.8 32.0 - 48.0 mmHg   pO2, Arterial 83.0 83.0 - 108.0 mmHg   Bicarbonate 23.0 20.0 - 28.0 mmol/L   TCO2 24 22 - 32 mmol/L   O2 Saturation 96.0 %   Acid-base deficit 1.0 0.0 - 2.0 mmol/L   Sodium 133 (L) 135 - 145 mmol/L   Potassium 3.9 3.5 - 5.1 mmol/L   Calcium, Ion 1.26 1.15 - 1.40 mmol/L   HCT 47.0 39.0 - 52.0 %   Hemoglobin 16.0 13.0 - 17.0 g/dL   Patient temperature 98.6 F    Collection site RADIAL, ALLEN'S TEST ACCEPTABLE    Drawn by RT    Sample type ARTERIAL   Urinalysis, Routine w reflex microscopic     Status: Abnormal   Collection Time: 10/17/18  2:30 AM  Result Value Ref Range   Color, Urine STRAW (A) YELLOW   APPearance CLEAR CLEAR  Specific Gravity, Urine 1.013 1.005 - 1.030   pH 6.0 5.0 - 8.0   Glucose, UA NEGATIVE NEGATIVE mg/dL   Hgb urine dipstick SMALL (A) NEGATIVE   Bilirubin Urine NEGATIVE NEGATIVE   Ketones, ur 5 (A) NEGATIVE mg/dL   Protein, ur NEGATIVE NEGATIVE mg/dL   Nitrite NEGATIVE NEGATIVE   Leukocytes,Ua NEGATIVE NEGATIVE   RBC / HPF 0-5 0 - 5 RBC/hpf   WBC, UA 0-5 0 - 5 WBC/hpf   Bacteria, UA NONE SEEN NONE SEEN   Mucus PRESENT     Comment: Performed at [facility] [address], [place], [place] [address]  Urine culture     Status: None   Collection Time: 10/17/18  2:30 AM  Result Value Ref Range   Specimen Description URINE, CLEAN CATCH    Special Requests NONE    Culture      NO GROWTH Performed at [facility], [place] [address] [place], [place] [address]    Report Status 10/18/2018 FINAL   Urine rapid drug screen (hosp performed)     Status: None   Collection Time: 10/17/18  2:30 AM  Result Value Ref Range   Opiates NONE DETECTED NONE DETECTED   Cocaine NONE DETECTED NONE DETECTED   Benzodiazepines NONE DETECTED NONE DETECTED   Amphetamines NONE DETECTED NONE DETECTED   Tetrahydrocannabinol NONE DETECTED NONE DETECTED   Barbiturates NONE DETECTED NONE DETECTED    Comment: (NOTE) DRUG SCREEN FOR MEDICAL  PURPOSES ONLY.  IF CONFIRMATION IS NEEDED FOR ANY PURPOSE, NOTIFY LAB WITHIN 5 DAYS. LOWEST DETECTABLE LIMITS FOR URINE DRUG SCREEN Drug Class                     Cutoff (ng/mL) Amphetamine and metabolites    1000 Barbiturate and metabolites    200 Benzodiazepine                 035 Tricyclics and metabolites     300 Opiates and metabolites        300 Cocaine and metabolites        300 THC                            50 Performed at [facility], [place] [address], [place], [place] [address]   Troponin I - Once-Timed     Status: None   Collection Time: 10/17/18  4:00 AM  Result Value Ref Range   Troponin I <0.03 <0.03 ng/mL    Comment: Performed at [facility] [address] [place], [place] [address]  Lactic acid, plasma     Status: None   Collection Time: 10/17/18  6:21 AM  Result Value Ref Range   Lactic Acid, Venous 0.9 0.5 - 1.9 mmol/L    Comment: Performed at [facility] [address], [place], [place] [address]  ECHOCARDIOGRAM COMPLETE     Status: None   Collection Time: 10/17/18  9:37 AM  Result Value Ref Range   Weight 3,168 oz   Height 68 in   BP 189/105 mmHg  Troponin I - Now Then Q3H     Status: None   Collection Time: 10/17/18 11:56 AM  Result Value Ref Range   Troponin I <0.03 <0.03 ng/mL    Comment: Performed at [facility], [place] [address] [place], [place] [address]  CBC     Status: None   Collection  Time: 10/18/18  4:23 AM  Result Value Ref Range   WBC 10.3 4.0 - 10.5 K/uL   RBC 5.60 4.22 - 5.81 MIL/uL   Hemoglobin 16.7 13.0 - 17.0 g/dL   HCT 48.7 39.0 - 52.0 %   MCV 87.0 80.0 - 100.0 fL   MCH 29.8 26.0 - 34.0 pg   MCHC 34.3 30.0 - 36.0 g/dL   RDW 13.5 11.5 - 15.5 %   Platelets 253 150 - 400 K/uL   nRBC 0.0 0.0 - 0.2 %    Comment: Performed at [facility], [place] [address] [place], [place] [address]  Basic metabolic panel     Status: Abnormal   Collection Time: 10/18/18  4:23 AM  Result Value Ref Range   Sodium  137 135 - 145 mmol/L   Potassium 4.3 3.5 - 5.1 mmol/L   Chloride 103 98 - 111 mmol/L   CO2 23 22 - 32 mmol/L   Glucose, Bld 119 (H) 70 - 99 mg/dL   BUN 20 8 - 23 mg/dL   Creatinine, Ser 1.40 (H) 0.61 - 1.24 mg/dL   Calcium 9.5 8.9 - 10.3 mg/dL   GFR calc non Af Amer 52 (L) >60 mL/min   GFR calc Af Amer >60 >60 mL/min   Anion gap 11 5 - 15    Comment: Performed at [facility], [place] [address] [place], [place] [address]    Assessment/Plan:  Discussed the following assessment and plan:  We reviewed hospital discharge notes.  Blood pressure has improved significantly with Norvasc and lisinopril.  We will follow-up in approximately 7 to 10 days to see how his blood pressure is doing.  Consider increase in lisinopril at this time.  Upon review of labs it was noted that he has had some elevated glucose goals in the past.  I want a recheck his BNP and will get an A1c and lipid panel at this time.  Once COVID restrictions are lifted we will bring him in for a CPE.  Will order colonoscopy since he is never had one.  Urged lifestyle modifications  Essential hypertension - Plan: Basic Metabolic Panel, amLODipine (NORVASC) 10 MG tablet, lisinopril (ZESTRIL) 20 MG tablet  Encounter to establish care  Hypertensive urgency  Depression, unspecified depression type  Mixed hyperlipidemia - Plan: Lipid panel  Colon cancer screening - Plan: Ambulatory referral to Gastroenterology  Elevated glucose - Plan: Basic Metabolic Panel, Hemoglobin A1c  Primary insomnia - Plan: QUEtiapine (SEROQUEL) 50 MG tablet     I discussed the assessment and treatment plan with the patient. The patient was provided an opportunity to ask questions and all were answered. The patient agreed with the plan and demonstrated an understanding of the instructions.   The patient was advised to call back or seek an in-person evaluation if the symptoms worsen or if the condition fails to improve as anticipated.   Dorothyann Peng, NP

## 2018-10-30 ENCOUNTER — Other Ambulatory Visit (INDEPENDENT_AMBULATORY_CARE_PROVIDER_SITE_OTHER): Payer: Medicare Other

## 2018-10-30 ENCOUNTER — Other Ambulatory Visit: Payer: Self-pay

## 2018-10-30 DIAGNOSIS — I10 Essential (primary) hypertension: Secondary | ICD-10-CM | POA: Diagnosis not present

## 2018-10-30 DIAGNOSIS — R7309 Other abnormal glucose: Secondary | ICD-10-CM

## 2018-10-30 DIAGNOSIS — E782 Mixed hyperlipidemia: Secondary | ICD-10-CM

## 2018-10-30 LAB — BASIC METABOLIC PANEL
BUN: 20 mg/dL (ref 6–23)
CO2: 25 mEq/L (ref 19–32)
Calcium: 9.6 mg/dL (ref 8.4–10.5)
Chloride: 100 mEq/L (ref 96–112)
Creatinine, Ser: 0.74 mg/dL (ref 0.40–1.50)
GFR: 105.99 mL/min (ref >60.00)
Glucose, Bld: 93 mg/dL (ref 70–99)
Potassium: 4.2 mEq/L (ref 3.5–5.1)
Sodium: 132 mEq/L — ABNORMAL LOW (ref 135–145)

## 2018-10-30 LAB — LIPID PANEL
Cholesterol: 299 mg/dL — ABNORMAL HIGH (ref 0–200)
HDL: 42.8 mg/dL (ref >39.00)
NonHDL: 256.01
Total CHOL/HDL Ratio: 7
Triglycerides: 333 mg/dL — ABNORMAL HIGH (ref 0.0–149.0)
VLDL: 66.6 mg/dL — ABNORMAL HIGH (ref 0.0–40.0)

## 2018-10-30 LAB — HEMOGLOBIN A1C: Hgb A1c MFr Bld: 6 % (ref 4.6–6.5)

## 2018-10-30 LAB — LDL CHOLESTEROL, DIRECT: Direct LDL: 187 mg/dL

## 2018-11-03 ENCOUNTER — Ambulatory Visit (INDEPENDENT_AMBULATORY_CARE_PROVIDER_SITE_OTHER): Payer: Medicare Other | Admitting: Adult Health

## 2018-11-03 ENCOUNTER — Encounter: Payer: Self-pay | Admitting: Adult Health

## 2018-11-03 ENCOUNTER — Other Ambulatory Visit: Payer: Self-pay

## 2018-11-03 DIAGNOSIS — M19042 Primary osteoarthritis, left hand: Secondary | ICD-10-CM | POA: Diagnosis not present

## 2018-11-03 DIAGNOSIS — M19041 Primary osteoarthritis, right hand: Secondary | ICD-10-CM | POA: Diagnosis not present

## 2018-11-03 DIAGNOSIS — E782 Mixed hyperlipidemia: Secondary | ICD-10-CM

## 2018-11-03 DIAGNOSIS — I10 Essential (primary) hypertension: Secondary | ICD-10-CM | POA: Diagnosis not present

## 2018-11-03 MED ORDER — LISINOPRIL 40 MG PO TABS: 40.0000 mg | ORAL_TABLET | Freq: Every day | ORAL | 1 refills | Status: DC

## 2018-11-03 MED ORDER — ATORVASTATIN CALCIUM 20 MG PO TABS: 20.0000 mg | ORAL_TABLET | Freq: Every day | ORAL | 1 refills | Status: DC

## 2018-11-03 MED ORDER — MELOXICAM 7.5 MG PO TABS: 7.5000 mg | ORAL_TABLET | Freq: Every day | ORAL | 1 refills | Status: DC

## 2018-11-03 NOTE — Progress Notes (Signed)
Virtual Visit via Video Note  I connected with Frank Frank [name] on 11/03/18 at  3:00 PM EDT by a video enabled telemedicine application and verified that I am speaking with the correct person using two identifiers.  Location patient: home Location provider:work or home office Persons participating in the virtual visit: patient, provider  I discussed the limitations of evaluation and management by telemedicine and the availability of in person appointments. The patient expressed understanding and agreed to proceed.   HPI: 68 year old male who is being evaluated today for follow-up regarding hypertension.  He was last seen on 10/26/2018 for an establish care/medical follow-up.  [place] follow-up was hypertensive emergency.  He was discharged home on lisinopril 20 mg as well as Norvasc 10 mg tablets.  At this time his blood pressure was starting to regulate and it was decided to keep him on current doses for another 7 to 10 days.  Today he reports that his blood pressures have been between  [id number] over 70s, with most being in the low 130s.  Does report that he has doubled up on his medications and now taking Norvasc 20 mg daily as well as lisinopril 40 mg daily.  He is getting intermittent episodes of dizziness/lightheadedness.  At his last visit I also had placed labs for cholesterol and diabetes screening.  His cholesterol panel came back elevated and his A1c did show that he was prediabetic at 6.0.  Lab Results  Component Value Date   CHOL 299 (H) 10/30/2018   HDL 42.80 10/30/2018   LDLCALC 245 (H) 04/02/2012   LDLDIRECT 187.0 10/30/2018   TRIG 333.0 (H) 10/30/2018   CHOLHDL 7 10/30/2018   Additionally, he reports chronic arthritic pain in multiple fingers in bilateral hands.  He reports that he is taking Advil which helps relieve the pain but he feels as though he is taking too much Advil over the course of the week.  His usual dose is 600 mg daily PRN.   ROS: Frank [name] pertinent positives and  negatives per HPI.  Past Medical History:  Diagnosis Date  . Anxiety   . Depression   . Drug addiction ([place])   . ETOH abuse   . High cholesterol   . Hypertensive urgency     Past Surgical History:  Procedure Laterality Date  . SKIN CANCER EXCISION  2015  . TONSILLECTOMY      Family History  Problem Relation Age of Onset  . Breast cancer Mother        Died at 48  . Lung cancer Father   . Prostate cancer Father        Died at 22       Current Outpatient Medications:  .  acetaminophen (TYLENOL) 325 MG tablet, Take 650 mg by mouth every 6 (six) hours as needed for fever., Disp: , Rfl:  .  amLODipine (NORVASC) 10 MG tablet, Take 1 tablet (10 mg total) by mouth daily., Disp: 90 tablet, Rfl: 1 .  atorvastatin (LIPITOR) 20 MG tablet, Take 1 tablet (20 mg total) by mouth daily., Disp: 90 tablet, Rfl: 1 .  lisinopril (ZESTRIL) 40 MG tablet, Take 1 tablet (40 mg total) by mouth daily., Disp: 90 tablet, Rfl: 1 .  meloxicam (MOBIC) 7.5 MG tablet, Take 1 tablet (7.5 mg total) by mouth daily., Disp: 90 tablet, Rfl: 1 .  QUEtiapine (SEROQUEL) 50 MG tablet, Take 1 tablet (50 mg total) by mouth at bedtime., Disp: 90 tablet, Rfl: 1  EXAM:  VITALS per patient if applicable:  GENERAL: alert, oriented, appears well and in no acute distress  HEENT: atraumatic, conjunttiva clear, no obvious abnormalities on inspection of external nose and ears  NECK: normal movements of the head and neck  LUNGS: on inspection no signs of respiratory distress, breathing rate appears normal, no obvious gross SOB, gasping or wheezing  CV: no obvious cyanosis  MS: moves all visible extremities without noticeable abnormality  PSYCH/NEURO: pleasant and cooperative, no obvious depression or anxiety, speech and thought processing grossly intact  ASSESSMENT AND PLAN:  Discussed the following assessment and plan:  Pressure is normalizing.  Advised to not double up on Norvasc, go back to 10 mg daily.  Will  start on Lipitor 20 mg and retest lipid panel at his physical sometime this summer.  Will send in Mobic 7.5 mg for osteoarthritic pain and he was advised to follow-up as needed  Essential hypertension - Plan: lisinopril (ZESTRIL) 40 MG tablet  Mixed hyperlipidemia - Plan: atorvastatin (LIPITOR) 20 MG tablet  Osteoarthritis of both hands, unspecified osteoarthritis type - Plan: meloxicam (MOBIC) 7.5 MG tablet     I discussed the assessment and treatment plan with the patient. The patient was provided an opportunity to ask questions and all were answered. The patient agreed with the plan and demonstrated an understanding of the instructions.   The patient was advised to call back or seek an in-person evaluation if the symptoms worsen or if the condition fails to improve as anticipated.   Dorothyann Peng, NP

## 2018-12-04 ENCOUNTER — Ambulatory Visit: Payer: Self-pay | Admitting: Adult Health

## 2018-12-04 NOTE — Telephone Encounter (Signed)
Patient has an appointment with PCP on  12/05/2018

## 2018-12-04 NOTE — Telephone Encounter (Signed)
Pt. Reports he has vertigo x 3 days with intense ringing in his ears. Reports the vertigo is mild. Warm transfer to [facility] in the practice.   Answer Assessment - Initial Assessment Questions 1. DESCRIPTION: "Describe your dizziness."     Vertigo 2. VERTIGO: "Do you feel like either you or the room is spinning or tilting?"      Yes 3. LIGHTHEADED: "Do you feel lightheaded?" (e.g., somewhat faint, woozy, weak upon standing)     Yes 4. SEVERITY: "How bad is it?"  "Can you walk?"   - MILD - Feels unsteady but walking normally.   - MODERATE - Feels very unsteady when walking, but not falling; interferes with normal activities (e.g., school, work) .   - SEVERE - Unable to walk without falling (requires assistance).     Mild 5. ONSET:  "When did the dizziness begin?"     3 days ago 6. AGGRAVATING FACTORS: "Does anything make it worse?" (e.g., standing, change in head position)     No 7. CAUSE: "What do you think is causing the dizziness?"     Vertigo 8. RECURRENT SYMPTOM: "Have you had dizziness before?" If so, ask: "When was the last time?" "What happened that time?"     No 9. OTHER SYMPTOMS: "Do you have any other symptoms?" (e.g., headache, weakness, numbness, vomiting, earache)     Ringing in ears 10. PREGNANCY: "Is there any chance you are pregnant?" "When was your last menstrual period?"       n/a  Protocols used: DIZZINESS - VERTIGO-A-AH

## 2018-12-05 ENCOUNTER — Ambulatory Visit (INDEPENDENT_AMBULATORY_CARE_PROVIDER_SITE_OTHER): Payer: Medicare Other | Admitting: Adult Health

## 2018-12-05 ENCOUNTER — Encounter: Payer: Self-pay | Admitting: Adult Health

## 2018-12-05 ENCOUNTER — Other Ambulatory Visit: Payer: Self-pay

## 2018-12-05 DIAGNOSIS — H6983 Other specified disorders of Eustachian tube, bilateral: Secondary | ICD-10-CM | POA: Diagnosis not present

## 2018-12-05 NOTE — Progress Notes (Signed)
Virtual Visit via Video Note  I connected with Delton See on 12/05/18 at 11:00 AM EDT by a video enabled telemedicine application and verified that I am speaking with the correct person using two identifiers.  Location patient: home Location provider:work or home office Persons participating in the virtual visit: patient, provider  I discussed the limitations of evaluation and management by telemedicine and the availability of in person appointments. The patient expressed understanding and agreed to proceed.   HPI:  68 year old male who is being evaluated today for an acute issue.  He does have a history of chronic tinnitus going back at least 10 years.  Reports that he woke up three days ago with worsening ringing in his ears, decreased hearing( muffled) bilaterally, and continuous dizziness.  Symptoms have not improved over the last 3 days.  He denies drainage, sinus pain or pressure, itchy watery eyes, or fevers.   ROS: See pertinent positives and negatives per HPI.  Past Medical History:  Diagnosis Date  . Anxiety   . Depression   . Drug addiction ([place])   . ETOH abuse   . High cholesterol   . Hypertensive urgency   . Tinnitus     Past Surgical History:  Procedure Laterality Date  . SKIN CANCER EXCISION  2015  . TONSILLECTOMY      Family History  Problem Relation Age of Onset  . Breast cancer Mother        Died at 73  . Lung cancer Father   . Prostate cancer Father        Died at 25      Current Outpatient Medications:  .  acetaminophen (TYLENOL) 325 MG tablet, Take 650 mg by mouth every 6 (six) hours as needed for fever., Disp: , Rfl:  .  amLODipine (NORVASC) 10 MG tablet, Take 1 tablet (10 mg total) by mouth daily., Disp: 90 tablet, Rfl: 1 .  atorvastatin (LIPITOR) 20 MG tablet, Take 1 tablet (20 mg total) by mouth daily., Disp: 90 tablet, Rfl: 1 .  lisinopril (ZESTRIL) 40 MG tablet, Take 1 tablet (40 mg total) by mouth daily., Disp: 90 tablet, Rfl: 1 .  meloxicam  (MOBIC) 7.5 MG tablet, Take 1 tablet (7.5 mg total) by mouth daily., Disp: 90 tablet, Rfl: 1 .  QUEtiapine (SEROQUEL) 50 MG tablet, Take 1 tablet (50 mg total) by mouth at bedtime., Disp: 90 tablet, Rfl: 1  EXAM:  VITALS per patient if applicable:  GENERAL: alert, oriented, appears well and in no acute distress  HEENT: atraumatic, conjunttiva clear, no obvious abnormalities on inspection of external nose and ears  NECK: normal movements of the head and neck  LUNGS: on inspection no signs of respiratory distress, breathing rate appears normal, no obvious gross SOB, gasping or wheezing  CV: no obvious cyanosis  MS: moves all visible extremities without noticeable abnormality  PSYCH/NEURO: pleasant and cooperative, no obvious depression or anxiety, speech and thought processing grossly intact  ASSESSMENT AND PLAN:  Discussed the following assessment and plan:  Eustachian tube dysfunction, bilateral -We will have him trial Flonase.  Cannot rule out Mnire's disease at this time.  If symptoms do not improve we will have him follow-up in the office late this week or early next week for further evaluation.  Consider referral to ear nose and throat    I discussed the assessment and treatment plan with the patient. The patient was provided an opportunity to ask questions and all were answered. The patient agreed with the  plan and demonstrated an understanding of the instructions.   The patient was advised to call back or seek an in-person evaluation if the symptoms worsen or if the condition fails to improve as anticipated.   Dorothyann Peng, NP

## 2019-03-28 ENCOUNTER — Ambulatory Visit (INDEPENDENT_AMBULATORY_CARE_PROVIDER_SITE_OTHER): Payer: Medicare Other | Admitting: Family Medicine

## 2019-03-28 ENCOUNTER — Other Ambulatory Visit: Payer: Self-pay | Admitting: Family Medicine

## 2019-03-28 ENCOUNTER — Encounter: Payer: Self-pay | Admitting: Family Medicine

## 2019-03-28 ENCOUNTER — Ambulatory Visit (INDEPENDENT_AMBULATORY_CARE_PROVIDER_SITE_OTHER): Payer: Medicare Other

## 2019-03-28 ENCOUNTER — Other Ambulatory Visit: Payer: Self-pay

## 2019-03-28 VITALS — BP 170/70 | HR 87 | Temp 97.7°F | Wt 202.6 lb

## 2019-03-28 DIAGNOSIS — R0781 Pleurodynia: Secondary | ICD-10-CM

## 2019-03-28 DIAGNOSIS — S20222A Contusion of left back wall of thorax, initial encounter: Secondary | ICD-10-CM

## 2019-03-28 IMAGING — DX DG RIBS W/ CHEST 3+V*L*
5 series · 5 of 5 positions shown · non-contrast
Comparison: [DATE]

CLINICAL DATA: Left rib pain following fall.

EXAM:
LEFT RIBS AND CHEST - 3+ VIEW

[hemithorax (ribs) ap (1 of 2)]
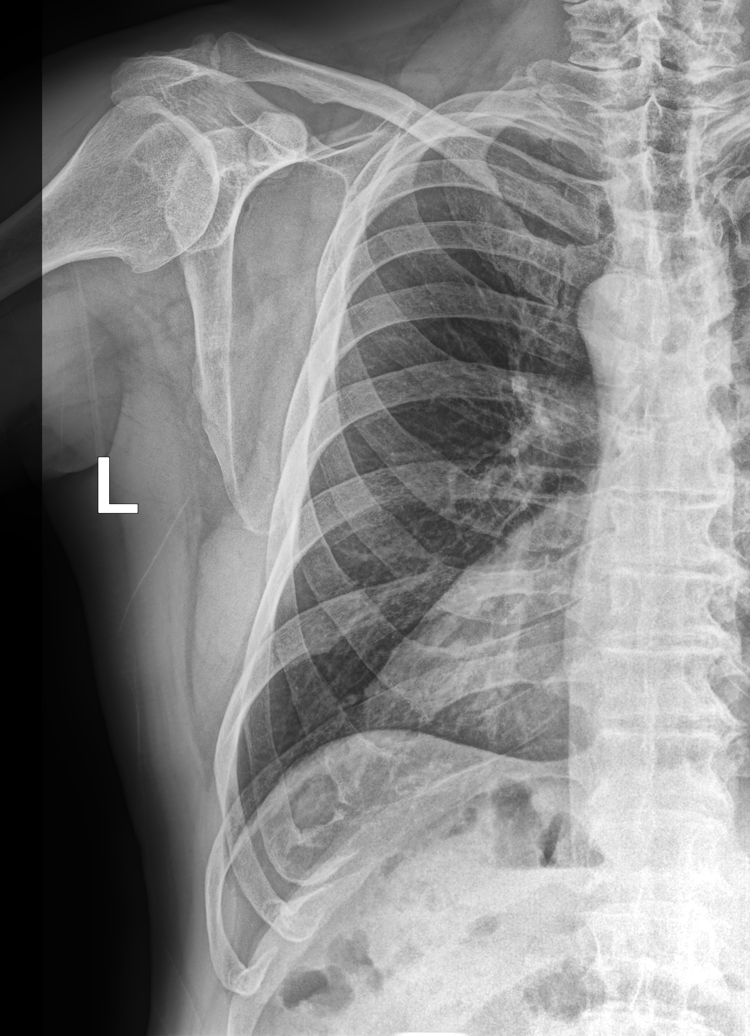

[chest pa]
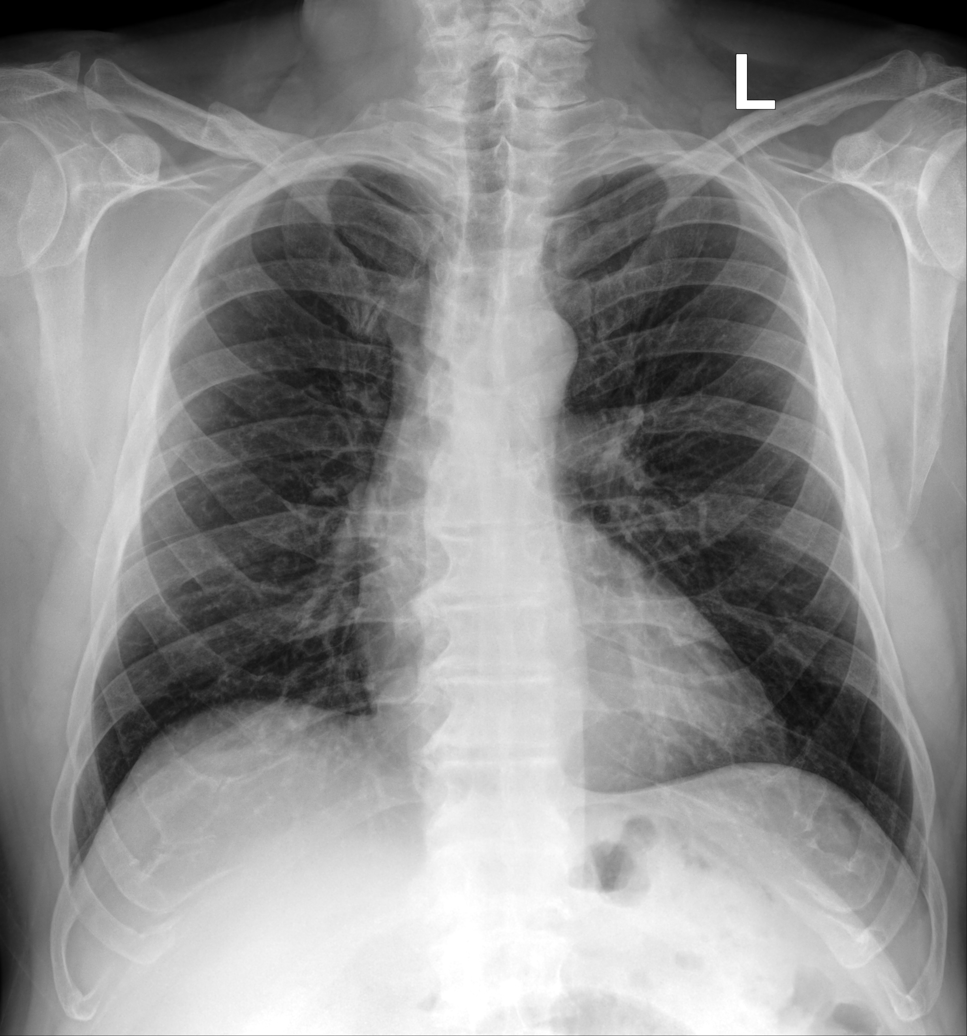

[hemithorax (ribs) mlo (1 of 2)]
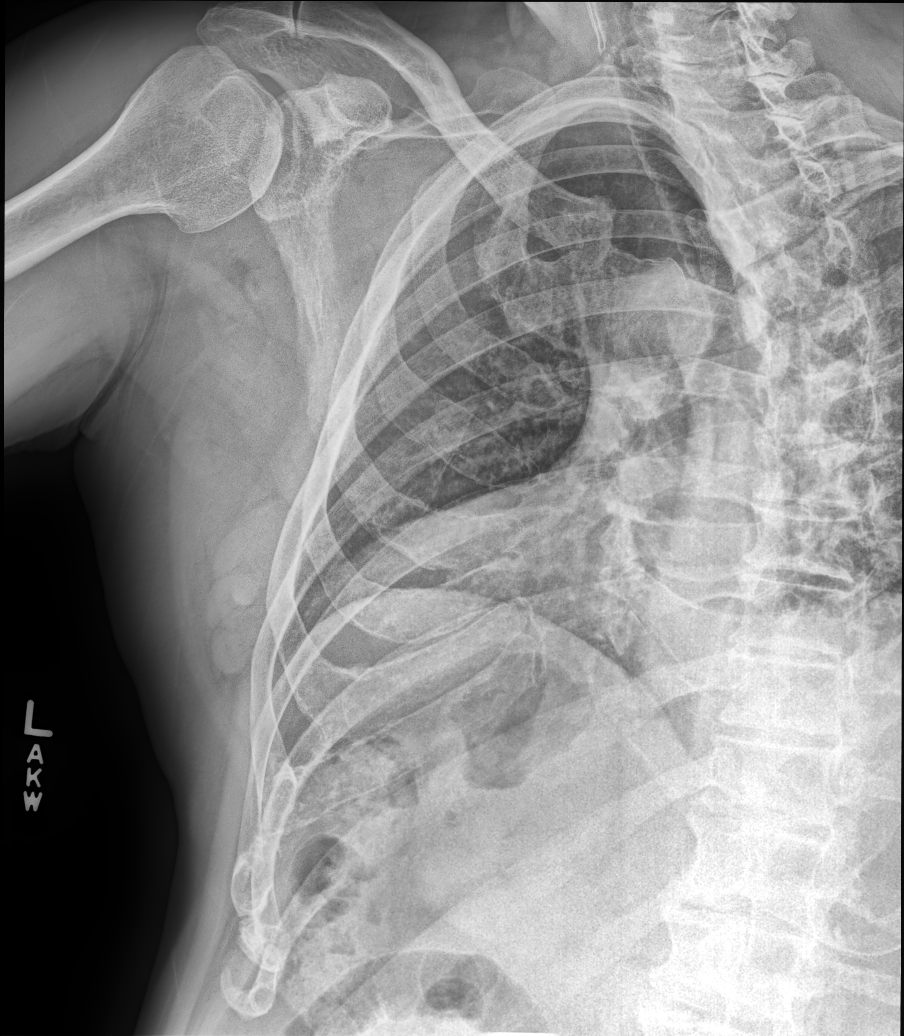

[hemithorax (ribs) ap (2 of 2)]
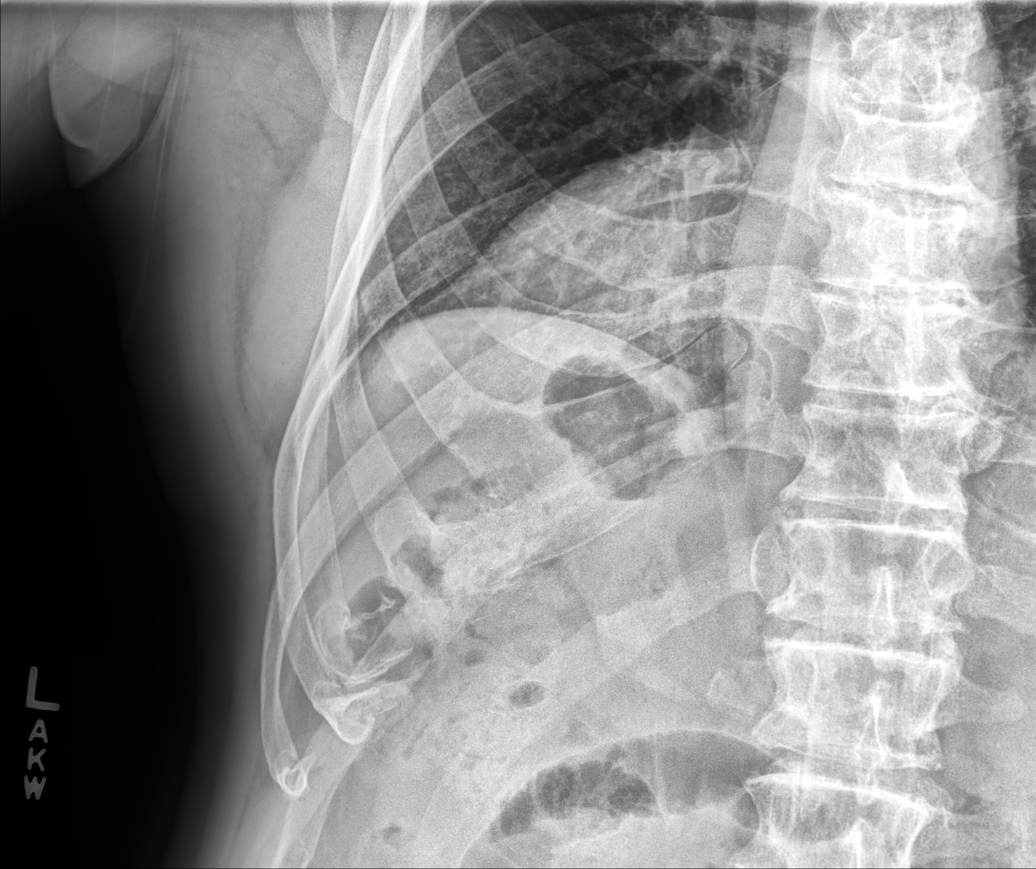

[hemithorax (ribs) mlo (2 of 2)]
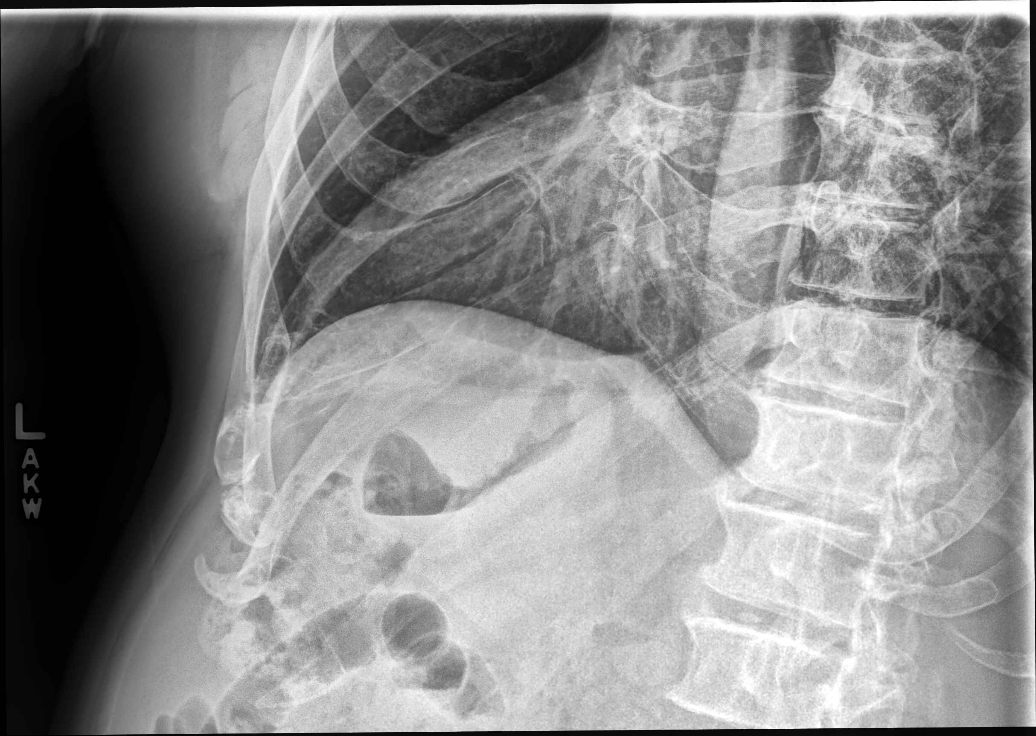

[5 of 5 positions shown; findings below may reference images not displayed]

FINDINGS: No fracture or other bone lesions are seen involving the ribs. There
is no evidence of pneumothorax or pleural effusion. Both lungs are
clear. Heart size and mediastinal contours are within normal limits.
Spinal degenerative changes are demonstrated.
IMPRESSION: Negative.

## 2019-03-28 IMAGING — DX DG LUMBAR SPINE COMPLETE 4+V
5 series · 5 of 5 positions shown · non-contrast
Comparison: None.

CLINICAL DATA: Recent fall with left back pain, initial encounter

EXAM:
LUMBAR SPINE - COMPLETE 4+ VIEW

[lumbar spine ap (1 of 2)]
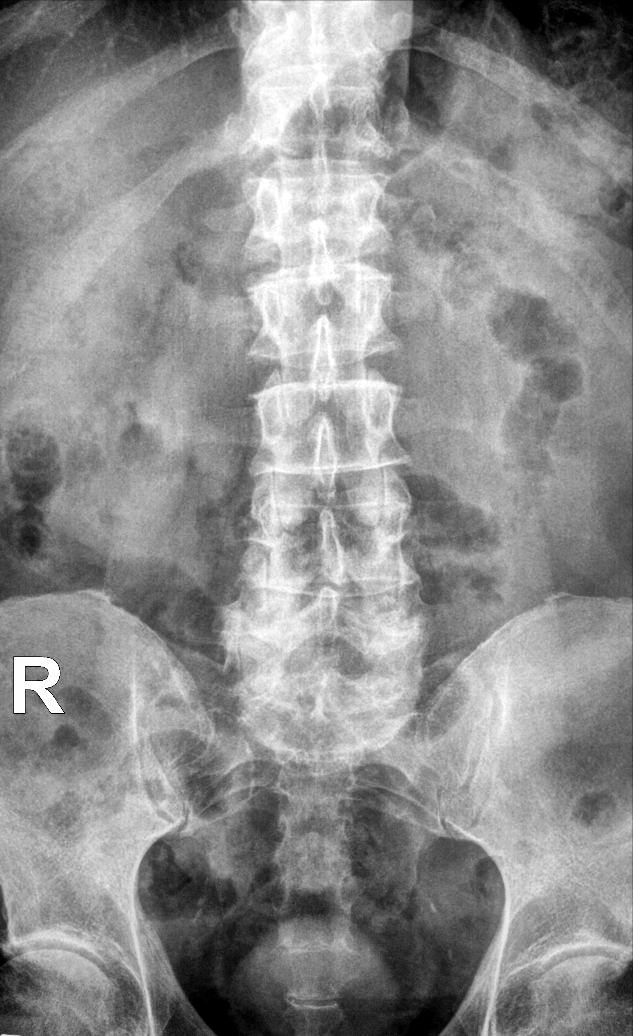

[lumbar spine oblique (1 of 2)]
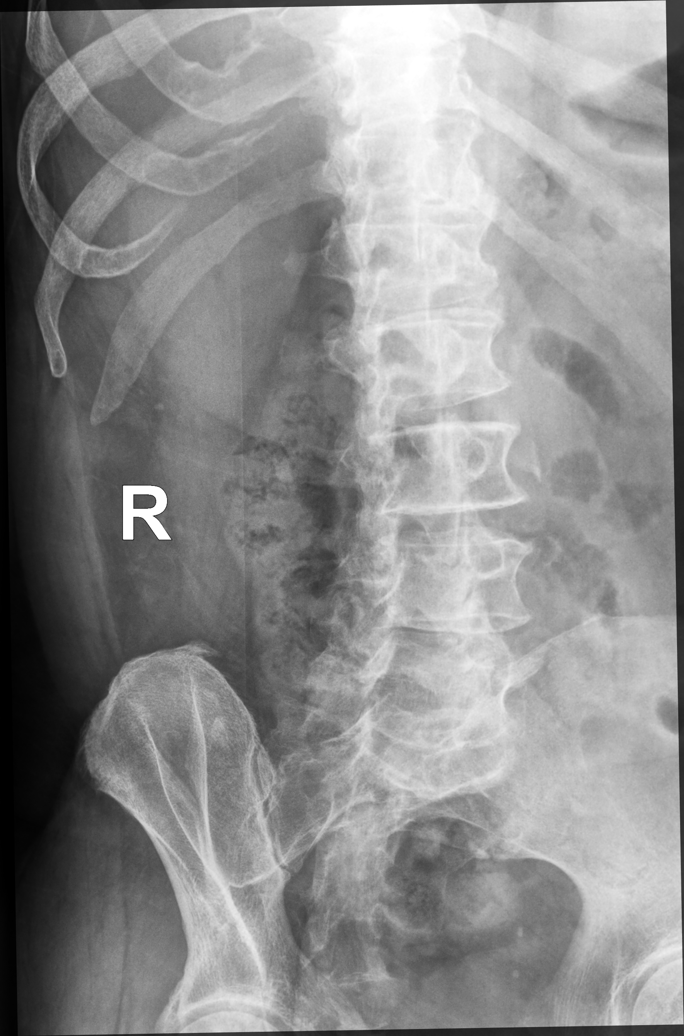

[lumbar spine oblique (2 of 2)]
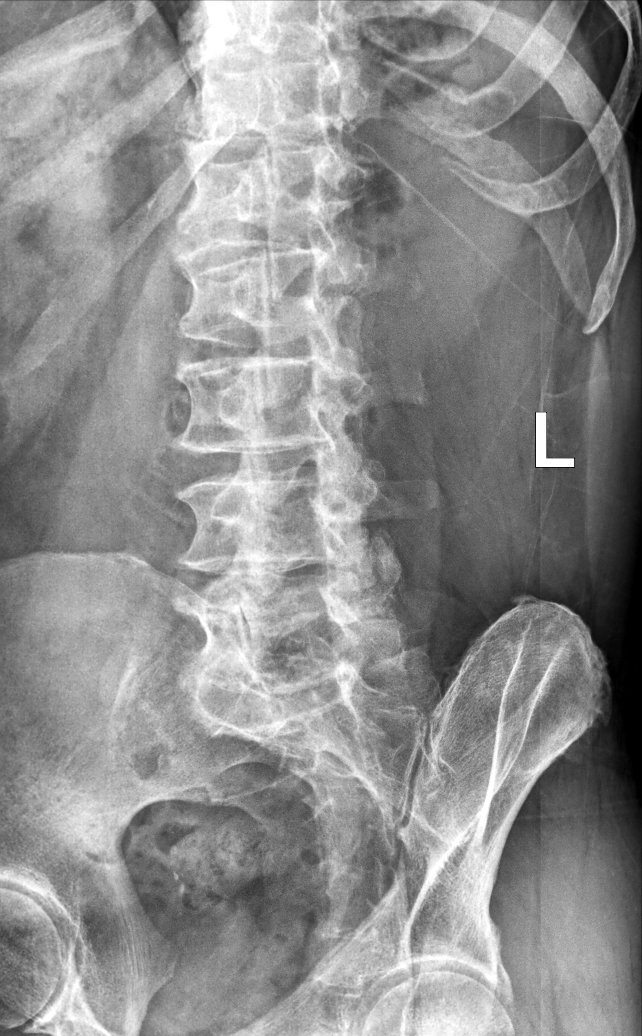

[lumbar spine lat]
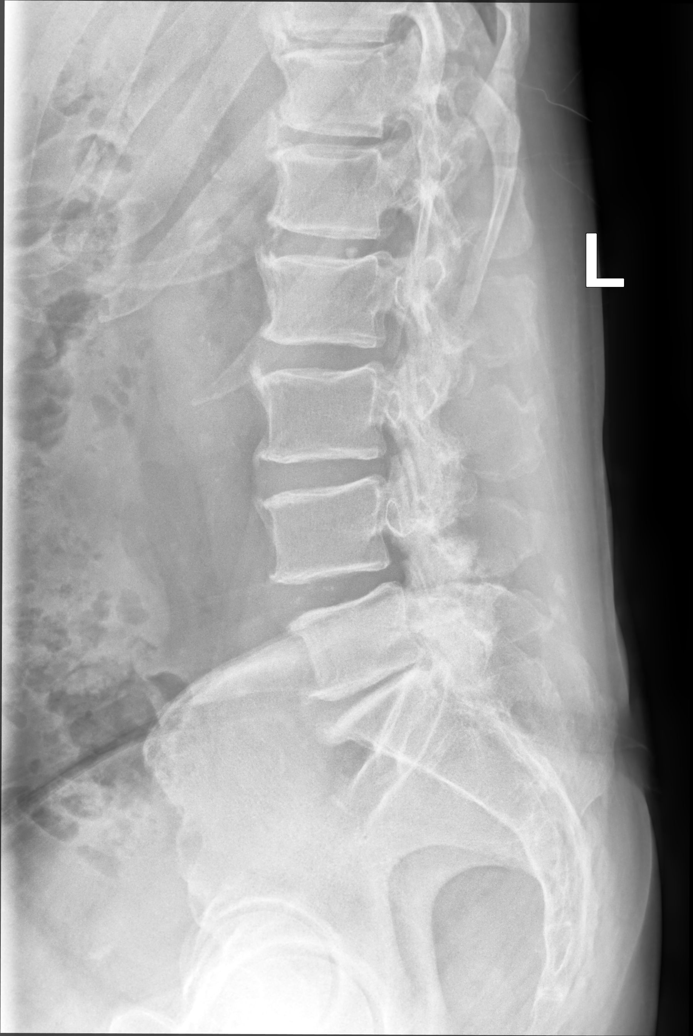

[lumbar spine ap (2 of 2)]
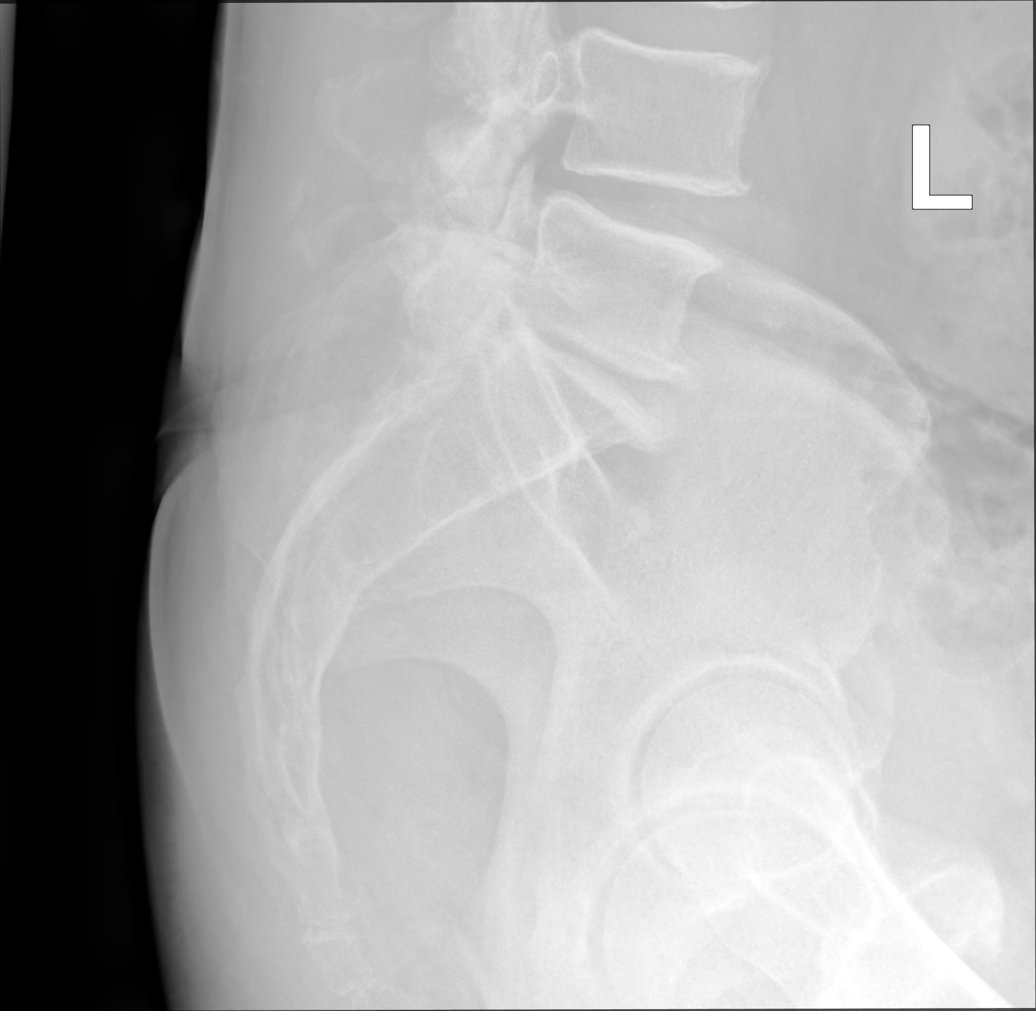

[5 of 5 positions shown; findings below may reference images not displayed]

FINDINGS: Five lumbar type vertebral bodies are well visualized. There are
changes suggestive of transverse process fractures on the left at
L1, L2 and possibly L3. No compression deformity is noted.
Multilevel osteophytic change and facet hypertrophic changes are
noted. No definitive rib fracture is seen. No soft tissue
abnormality is noted.
IMPRESSION: Findings suggestive of transverse process fractures on the left as
described. No other acute abnormality is seen.

## 2019-03-28 MED ORDER — CYCLOBENZAPRINE HCL 5 MG PO TABS: 5.0000 mg | ORAL_TABLET | Freq: Three times a day (TID) | ORAL | 1 refills | Status: DC | PRN

## 2019-03-28 NOTE — Patient Instructions (Signed)
Consider topical heat  Consider over the counter lidocaine patch  Caution with the muscle relaxer which can cause sedation.

## 2019-03-28 NOTE — Progress Notes (Signed)
Subjective:     Patient ID: Frank [name], male   DOB: 08/07/1950, 68 y.o.   MRN: [id number]  HPI Patient is here for back pain following fall last night.  He went out on his porch with wet steps and lost his footing and fell back against the edge of a concrete step against his upper lumbar area.  He has had some fairly severe back pain since then with fairly extensive bruising.  This also involves his left posterior ribs near the spine region.  He did not have any head injury.  Denies any other major injury.  He has significant back pain with any movement.  Moderate to severe.  He thinks he has some secondary muscle spasm as well. Took some over-the-counter Advil with minimal relief.  Chronic problems include hypertension.  He states his blood pressure generally well controlled but he thinks up today because of pain.  Past Medical History:  Diagnosis Date  . Anxiety   . Depression   . Drug addiction ([place])   . ETOH abuse   . High cholesterol   . Hypertensive urgency   . Tinnitus    Past Surgical History:  Procedure Laterality Date  . SKIN CANCER EXCISION  2015  . TONSILLECTOMY      reports that he has never smoked. He has never used smokeless tobacco. He reports current alcohol use. He reports previous drug use. Drugs: Other-see comments, "Crack" cocaine, and Cocaine. family history includes Breast cancer in his mother; Lung cancer in his father; Prostate cancer in his father. No Known Allergies   Review of Systems  Constitutional: Negative for chills and fever.  Eyes: Negative for visual disturbance.  Respiratory: Negative for cough and shortness of breath.   Cardiovascular: Negative for chest pain.  Gastrointestinal: Negative for abdominal pain, nausea and vomiting.  Genitourinary: Negative for hematuria.  Musculoskeletal: Positive for back pain.  Neurological: Negative for syncope and headaches.       Objective:   Physical Exam Constitutional:      Appearance: Normal  appearance.  HENT:     Head: Normocephalic and atraumatic.  Neck:     Musculoskeletal: Neck supple.  Cardiovascular:     Rate and Rhythm: Normal rate and regular rhythm.  Pulmonary:     Effort: Pulmonary effort is normal.     Breath sounds: Normal breath sounds.  Musculoskeletal:     Right lower leg: No edema.     Left lower leg: No edema.     Comments: He has fairly large area of ecchymosis about 10 x 10 cm just left of the spine in the upper lumbar spine region.  Very tender to palpation.  No definite hematoma.  No breaks in the skin.  Neurological:     Mental Status: He is alert.        Assessment:     Trauma with fall against steps last night.  He has pain in the upper lumbar and left posterior inferior rib region Suspect he has some secondary muscle spasm in this region    Plan:     -Obtain lumbosacral spine films and also left rib films -Heat or ice for symptom relief -Low-dose Flexeril 5 mg 1-2 every 8 hours as needed for muscle spasm.  Cautioned about sedation. -We will try to avoid opioids if possible because of his prior history of drug addiction though this apparently was to stimulant medications and not opioids -Recommend home monitoring of blood pressure and be in touch with primary  if consistently greater than 140/90  Eulas Post MD  Primary Care at [facility]

## 2019-04-25 ENCOUNTER — Other Ambulatory Visit: Payer: Self-pay | Admitting: Family Medicine

## 2019-04-25 DIAGNOSIS — I10 Essential (primary) hypertension: Secondary | ICD-10-CM

## 2019-04-25 DIAGNOSIS — E782 Mixed hyperlipidemia: Secondary | ICD-10-CM

## 2019-04-25 DIAGNOSIS — F5101 Primary insomnia: Secondary | ICD-10-CM

## 2019-04-25 MED ORDER — ATORVASTATIN CALCIUM 20 MG PO TABS: 20.0000 mg | ORAL_TABLET | Freq: Every day | ORAL | 1 refills | Status: DC

## 2019-04-25 MED ORDER — QUETIAPINE FUMARATE 50 MG PO TABS: 50.0000 mg | ORAL_TABLET | Freq: Every day | ORAL | 1 refills | Status: DC

## 2019-04-25 MED ORDER — AMLODIPINE BESYLATE 10 MG PO TABS: 10.0000 mg | ORAL_TABLET | Freq: Every day | ORAL | 1 refills | Status: DC

## 2019-04-25 MED ORDER — LISINOPRIL 40 MG PO TABS: 40.0000 mg | ORAL_TABLET | Freq: Every day | ORAL | 1 refills | Status: DC

## 2019-09-02 ENCOUNTER — Ambulatory Visit: Payer: Medicare Other

## 2019-09-03 ENCOUNTER — Ambulatory Visit: Payer: Medicare Other | Attending: Internal Medicine

## 2019-09-03 DIAGNOSIS — Z23 Encounter for immunization: Secondary | ICD-10-CM | POA: Insufficient documentation

## 2019-09-03 NOTE — Progress Notes (Signed)
   Covid-19 Vaccination Clinic  Name:  Frank [name]    MRN: [id number] DOB: 1950/10/15  09/03/2019  [name] was observed post Covid-19 immunization for 15 minutes without incident. He was provided with Vaccine Information Sheet and instruction to access the V-Safe system.   [name] was instructed to call 911 with any severe reactions post vaccine: Marland Kitchen Difficulty breathing  . Swelling of face and throat  . A fast heartbeat  . A bad rash all over body  . Dizziness and weakness   Immunizations Administered    Name Date Dose VIS Date Route   Pfizer COVID-19 Vaccine 09/03/2019  9:41 AM 0.3 mL 06/08/2019 Intramuscular   Manufacturer: [place]   Lot: [id number]   [place]: [id number]

## 2019-10-03 ENCOUNTER — Ambulatory Visit: Payer: Medicare Other | Attending: Internal Medicine

## 2019-10-03 DIAGNOSIS — Z23 Encounter for immunization: Secondary | ICD-10-CM

## 2019-10-03 NOTE — Progress Notes (Signed)
   Covid-19 Vaccination Clinic  Name:  BREXTEN CALERO    MRN: [id number] DOB: 03-22-1951  10/03/2019  [name] was observed post Covid-19 immunization for 15 minutes without incident. He was provided with Vaccine Information Sheet and instruction to access the V-Safe system.   [name] was instructed to call 911 with any severe reactions post vaccine: Marland Kitchen Difficulty breathing  . Swelling of face and throat  . A fast heartbeat  . A bad rash all over body  . Dizziness and weakness   Immunizations Administered    Name Date Dose VIS Date Route   Pfizer COVID-19 Vaccine 10/03/2019 10:45 AM 0.3 mL 06/08/2019 Intramuscular   Manufacturer: [employer]   Lot: [id number]   [place]: [id number]

## 2019-10-17 ENCOUNTER — Other Ambulatory Visit: Payer: Self-pay | Admitting: Adult Health

## 2019-10-17 DIAGNOSIS — F5101 Primary insomnia: Secondary | ICD-10-CM

## 2019-10-17 MED ORDER — QUETIAPINE FUMARATE 50 MG PO TABS: 50.0000 mg | ORAL_TABLET | Freq: Every day | ORAL | 1 refills | Status: DC

## 2019-10-17 NOTE — Addendum Note (Signed)
Addended by: Rebecca Eaton on: 10/17/2019 02:31 PM   Modules accepted: Orders

## 2019-10-17 NOTE — Telephone Encounter (Signed)
     QUEtiapine (SEROQUEL) 50 MG tablet  [id number] - [address], [address] Phone:  [phone]  Fax:  [phone]     The patient needs a refill on this Rx. He has about 5 days left.

## 2019-10-17 NOTE — Addendum Note (Signed)
Addended by: Rebecca Eaton on: 10/17/2019 04:13 PM   Modules accepted: Orders

## 2019-10-17 NOTE — Telephone Encounter (Signed)
Rx sent in. Patient is aware.  

## 2019-10-20 ENCOUNTER — Other Ambulatory Visit: Payer: Self-pay | Admitting: Adult Health

## 2019-10-20 DIAGNOSIS — F5101 Primary insomnia: Secondary | ICD-10-CM

## 2019-10-20 DIAGNOSIS — M19042 Primary osteoarthritis, left hand: Secondary | ICD-10-CM

## 2019-10-20 DIAGNOSIS — E782 Mixed hyperlipidemia: Secondary | ICD-10-CM

## 2019-10-20 DIAGNOSIS — I10 Essential (primary) hypertension: Secondary | ICD-10-CM

## 2019-10-20 DIAGNOSIS — M19041 Primary osteoarthritis, right hand: Secondary | ICD-10-CM

## 2019-10-23 NOTE — Telephone Encounter (Signed)
Sent to the pharmacy by e-scribe for 30 days.  Pt is past due for cpx. 

## 2019-12-18 ENCOUNTER — Telehealth: Payer: Self-pay | Admitting: Adult Health

## 2019-12-18 DIAGNOSIS — I10 Essential (primary) hypertension: Secondary | ICD-10-CM

## 2019-12-18 DIAGNOSIS — E782 Mixed hyperlipidemia: Secondary | ICD-10-CM

## 2019-12-18 DIAGNOSIS — M19041 Primary osteoarthritis, right hand: Secondary | ICD-10-CM

## 2019-12-18 NOTE — Telephone Encounter (Signed)
Pt stated back in April, 3 of these medications were refilled at a [facility] but the pt did not need them at that time so he denied the medications. Pt is now needing these medications and only wants it to be sent to [facility].    Medication Refill:  Amlodipine Lisinopril Atorvastatin Meloxicam  Pharmacy: [facility] FAX: [phone]

## 2019-12-19 NOTE — Telephone Encounter (Signed)
Pt needs to be scheduled for cpx before medications can be filled.

## 2019-12-19 NOTE — Telephone Encounter (Signed)
LVM to schedule for AWV/CPE

## 2019-12-24 NOTE — Telephone Encounter (Signed)
2nd VM left to schedule AWV/CPE

## 2019-12-25 MED ORDER — ATORVASTATIN CALCIUM 20 MG PO TABS: ORAL_TABLET | ORAL | 0 refills | Status: DC

## 2019-12-25 MED ORDER — MELOXICAM 7.5 MG PO TABS: 7.5000 mg | ORAL_TABLET | Freq: Every day | ORAL | 0 refills | Status: DC

## 2019-12-25 MED ORDER — AMLODIPINE BESYLATE 10 MG PO TABS: ORAL_TABLET | ORAL | 0 refills | Status: DC

## 2019-12-25 MED ORDER — LISINOPRIL 40 MG PO TABS: 40.0000 mg | ORAL_TABLET | Freq: Every day | ORAL | 0 refills | Status: DC

## 2019-12-25 NOTE — Telephone Encounter (Signed)
Sent to the pharmacy by e-scribe. 

## 2019-12-25 NOTE — Telephone Encounter (Signed)
Pt is scheduled for August 5th at 10:30 am. Pt is out of his medications and wonders if these medication can be refilled until he is able to be seen?   Medication Refill:  Amlodipine Lisinopril Atorvastatin Meloxicam  Pharmacy: [facility] FAX: [phone]

## 2020-01-31 ENCOUNTER — Ambulatory Visit (INDEPENDENT_AMBULATORY_CARE_PROVIDER_SITE_OTHER): Payer: Medicare Other | Admitting: Adult Health

## 2020-01-31 ENCOUNTER — Encounter: Payer: Self-pay | Admitting: Adult Health

## 2020-01-31 ENCOUNTER — Other Ambulatory Visit: Payer: Self-pay

## 2020-01-31 VITALS — BP 164/88 | Temp 99.0°F | Ht 69.0 in | Wt 202.0 lb

## 2020-01-31 DIAGNOSIS — R7309 Other abnormal glucose: Secondary | ICD-10-CM | POA: Diagnosis not present

## 2020-01-31 DIAGNOSIS — Z1211 Encounter for screening for malignant neoplasm of colon: Secondary | ICD-10-CM

## 2020-01-31 DIAGNOSIS — I10 Essential (primary) hypertension: Secondary | ICD-10-CM

## 2020-01-31 DIAGNOSIS — E782 Mixed hyperlipidemia: Secondary | ICD-10-CM

## 2020-01-31 DIAGNOSIS — Z85828 Personal history of other malignant neoplasm of skin: Secondary | ICD-10-CM

## 2020-01-31 DIAGNOSIS — Z125 Encounter for screening for malignant neoplasm of prostate: Secondary | ICD-10-CM

## 2020-01-31 DIAGNOSIS — Z23 Encounter for immunization: Secondary | ICD-10-CM

## 2020-01-31 DIAGNOSIS — Z1159 Encounter for screening for other viral diseases: Secondary | ICD-10-CM | POA: Diagnosis not present

## 2020-01-31 DIAGNOSIS — E7439 Other disorders of intestinal carbohydrate absorption: Secondary | ICD-10-CM

## 2020-01-31 DIAGNOSIS — Z0000 Encounter for general adult medical examination without abnormal findings: Secondary | ICD-10-CM | POA: Diagnosis not present

## 2020-01-31 DIAGNOSIS — F5101 Primary insomnia: Secondary | ICD-10-CM

## 2020-01-31 NOTE — Patient Instructions (Signed)
It was great seeing you today  We will follow up with you regarding your blood work   Work on weight loss through diet and exercise  Someone will call you to schedule your colonoscopy and dermatology appointment   Health Maintenance, Male A healthy lifestyle and preventative care can promote health and wellness.  Maintain regular health, dental, and eye exams.  Eat a healthy diet. Foods like vegetables, fruits, whole grains, low-fat dairy products, and lean protein foods contain the nutrients you need and are low in calories. Decrease your intake of foods high in solid fats, added sugars, and salt. Get information about a proper diet from your health care provider, if necessary.  Regular physical exercise is one of the most important things you can do for your health. Most adults should get at least 150 minutes of moderate-intensity exercise (any activity that increases your heart rate and causes you to sweat) each week. In addition, most adults need muscle-strengthening exercises on 2 or more days a week.   Maintain a healthy weight. The body mass index (BMI) is a screening tool to identify possible weight problems. It provides an estimate of body fat based on height and weight. Your health care provider can find your BMI and can help you achieve or maintain a healthy weight. For males 20 years and older:  A BMI below 18.5 is considered underweight.  A BMI of 18.5 to 24.9 is normal.  A BMI of 25 to 29.9 is considered overweight.  A BMI of 30 and above is considered obese.  Maintain normal blood lipids and cholesterol by exercising and minimizing your intake of saturated fat. Eat a balanced diet with plenty of fruits and vegetables. Blood tests for lipids and cholesterol should begin at age 11 and be repeated every 5 years. If your lipid or cholesterol levels are high, you are over age 67, or you are at high risk for heart disease, you may need your cholesterol levels checked more  frequently.Ongoing high lipid and cholesterol levels should be treated with medicines if diet and exercise are not working.  If you smoke, find out from your health care provider how to quit. If you do not use tobacco, do not start.  Lung cancer screening is recommended for adults aged 59-80 years who are at high risk for developing lung cancer because of a history of smoking. A yearly low-dose CT scan of the lungs is recommended for people who have at least a 30-pack-year history of smoking and are current smokers or have quit within the past 15 years. A pack year of smoking is smoking an average of 1 pack of cigarettes a day for 1 year (for example, a 30-pack-year history of smoking could mean smoking 1 pack a day for 30 years or 2 packs a day for 15 years). Yearly screening should continue until the smoker has stopped smoking for at least 15 years. Yearly screening should be stopped for people who develop a health problem that would prevent them from having lung cancer treatment.  If you choose to drink alcohol, do not have more than 2 drinks per day. One drink is considered to be 12 oz (360 mL) of beer, 5 oz (150 mL) of wine, or 1.5 oz (45 mL) of liquor.  Avoid the use of street drugs. Do not share needles with anyone. Ask for help if you need support or instructions about stopping the use of drugs.  High blood pressure causes heart disease and increases the risk  of stroke. High blood pressure is more likely to develop in:  People who have blood pressure in the end of the normal range (100-139/85-89 mm Hg).  People who are overweight or obese.  People who are African American.  If you are 30-53 years of age, have your blood pressure checked every 3-5 years. If you are 14 years of age or older, have your blood pressure checked every year. You should have your blood pressure measured twice--once when you are at a hospital or clinic, and once when you are not at a hospital or clinic. Record the  average of the two measurements. To check your blood pressure when you are not at a hospital or clinic, you can use:  An automated blood pressure machine at a pharmacy.  A home blood pressure monitor.  If you are 16-3 years old, ask your health care provider if you should take aspirin to prevent heart disease.  Diabetes screening involves taking a blood sample to check your fasting blood sugar level. This should be done once every 3 years after age 80 if you are at a normal weight and without risk factors for diabetes. Testing should be considered at a younger age or be carried out more frequently if you are overweight and have at least 1 risk factor for diabetes.  Colorectal cancer can be detected and often prevented. Most routine colorectal cancer screening begins at the age of 77 and continues through age 69. However, your health care provider may recommend screening at an earlier age if you have risk factors for colon cancer. On a yearly basis, your health care provider may provide home test kits to check for hidden blood in the stool. A small camera at the end of a tube may be used to directly examine the colon (sigmoidoscopy or colonoscopy) to detect the earliest forms of colorectal cancer. Talk to your health care provider about this at age 49 when routine screening begins. A direct exam of the colon should be repeated every 5-10 years through age 16, unless early forms of precancerous polyps or small growths are found.  People who are at an increased risk for hepatitis B should be screened for this virus. You are considered at high risk for hepatitis B if:  You were born in a country where hepatitis B occurs often. Talk with your health care provider about which countries are considered high risk.  Your parents were born in a high-risk country and you have not received a shot to protect against hepatitis B (hepatitis B vaccine).  You have HIV or AIDS.  You use needles to inject street  drugs.  You live with, or have sex with, someone who has hepatitis B.  You are a man who has sex with other men (MSM).  You get hemodialysis treatment.  You take certain medicines for conditions like cancer, organ transplantation, and autoimmune conditions.  Hepatitis C blood testing is recommended for all people born from [birth year] through 1963 and any individual with known risk factors for hepatitis C.  Healthy men should no longer receive prostate-specific antigen (PSA) blood tests as part of routine cancer screening. Talk to your health care provider about prostate cancer screening.  Testicular cancer screening is not recommended for adolescents or adult males who have no symptoms. Screening includes self-exam, a health care provider exam, and other screening tests. Consult with your health care provider about any symptoms you have or any concerns you have about testicular cancer.  Practice safe sex.  Use condoms and avoid high-risk sexual practices to reduce the spread of sexually transmitted infections (STIs).  You should be screened for STIs, including gonorrhea and chlamydia if:  You are sexually active and are younger than 24 years.  You are older than 24 years, and your health care provider tells you that you are at risk for this type of infection.  Your sexual activity has changed since you were last screened, and you are at an increased risk for chlamydia or gonorrhea. Ask your health care provider if you are at risk.  If you are at risk of being infected with HIV, it is recommended that you take a prescription medicine daily to prevent HIV infection. This is called pre-exposure prophylaxis (PrEP). You are considered at risk if:  You are a man who has sex with other men (MSM).  You are a heterosexual man who is sexually active with multiple partners.  You take drugs by injection.  You are sexually active with a partner who has HIV.  Talk with your health care provider about  whether you are at high risk of being infected with HIV. If you choose to begin PrEP, you should first be tested for HIV. You should then be tested every 3 months for as long as you are taking PrEP.  Use sunscreen. Apply sunscreen liberally and repeatedly throughout the day. You should seek shade when your shadow is shorter than you. Protect yourself by wearing long sleeves, pants, a wide-brimmed hat, and sunglasses year round whenever you are outdoors.  Tell your health care provider of new moles or changes in moles, especially if there is a change in shape or color. Also, tell your health care provider if a mole is larger than the size of a pencil eraser.  A one-time screening for abdominal aortic aneurysm (AAA) and surgical repair of large AAAs by ultrasound is recommended for men aged 73-75 years who are current or former smokers.  Stay current with your vaccines (immunizations).   This information is not intended to replace advice given to you by your health care provider. Make sure you discuss any questions you have with your health care provider.   Document Released: 12/11/2007 Document Revised: 07/05/2014 Document Reviewed: 11/09/2010 Elsevier Interactive Patient Education [employer].

## 2020-01-31 NOTE — Progress Notes (Signed)
Subjective:    Patient ID: Frank [name], male    DOB: Aug 06, 1950, 69 y.o.   MRN: [id number]  HPI Patient presents for yearly preventative medicine examination. He is a pleasant 69 year old male who  has a past medical history of Anxiety, Depression, Drug addiction ([place]), ETOH abuse, High cholesterol, Hypertensive urgency, and Tinnitus.  Essential Hypertension -currently prescribed lisinopril 40 mg daily and Norvasc 10 mg daily.  Was admitted to [facility] in April 2020 for hypertensive emergency in which time he had no past history of having hypertension at which time he was started on his current medications. He does check his BP at home and gets readings between 110-120's/70-80's. He gets anxious when he comes here.   BP Readings from Last 3 Encounters:  01/31/20 (!) 164/88  03/28/19 (!) 170/70  10/18/18 (!) 153/90   Insomnia - currently prescribed Seroquel 50 mg QHS feels as though he sleeps well.   Hyperlipidemia - takes lipitor 20 mg daily. He denies myalgia or fatigue  Lab Results  Component Value Date   CHOL 299 (H) 10/30/2018   HDL 42.80 10/30/2018   LDLCALC 245 (H) 04/02/2012   LDLDIRECT 187.0 10/30/2018   TRIG 333.0 (H) 10/30/2018   CHOLHDL 7 10/30/2018   Glucose Intolerance -has had an A1c of greater than 5.6 for multiple years now.  Most recent in May 2020 was 6.0.  H/o SCC - removed from left arm about 5 years ago. He would like a referral to dermatology   All immunizations and health maintenance protocols were reviewed with the patient and needed orders were placed.  Appropriate screening laboratory values were ordered for the patient including screening of hyperlipidemia, renal function and hepatic function. If indicated by BPH, a PSA was ordered.  Medication reconciliation,  past medical history, social history, problem list and allergies were reviewed in detail with the patient  Goals were established with regard to weight loss, exercise, and  diet in  compliance with medications.  He enjoys mountain biking and hiking and does this frequently.  Is to eat a heart healthy diet that is low in carbs and sugars Wt Readings from Last 3 Encounters:  01/31/20 202 lb (91.6 kg)  03/28/19 202 lb 9.6 oz (91.9 kg)  10/18/18 197 lb 11.2 oz (89.7 kg)   He has never had a colonoscopy   Review of Systems  Constitutional: Negative.   HENT: Negative.   Eyes: Negative.   Respiratory: Negative.   Cardiovascular: Negative.   Gastrointestinal: Negative.   Endocrine: Negative.   Genitourinary: Negative.   Musculoskeletal: Negative.   Skin: Negative.   Allergic/Immunologic: Negative.   Neurological: Negative.   Hematological: Negative.   Psychiatric/Behavioral: Negative.   All other systems reviewed and are negative.  Past Medical History:  Diagnosis Date  . Anxiety   . Depression   . Drug addiction ([place])   . ETOH abuse   . High cholesterol   . Hypertensive urgency   . Tinnitus     Social History   Socioeconomic History  . Marital status: Divorced    Spouse name: Not on file  . Number of children: Not on file  . Years of education: Not on file  . Highest education level: Not on file  Occupational History  . Not on file  Tobacco Use  . Smoking status: Never Smoker  . Smokeless tobacco: Never Used  Substance and Sexual Activity  . Alcohol use: Yes    Comment: social history   .  Drug use: Not Currently    Types: Other-see comments, "Crack" cocaine, Cocaine  . Sexual activity: Not on file    Comment: oxycontin  Other Topics Concern  . 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    Social Determinants of Health   Financial Resource Strain:   . Difficulty of Paying Living Expenses:   Food Insecurity:   . Worried About Charity fundraiser in the Last Year:   . Arboriculturist in the Last Year:   Transportation Needs:   . Lexicographer (Medical):   Marland Kitchen Lack of Transportation (Non-Medical):   Physical Activity:   . Days of Exercise per Week:   . Minutes of Exercise per Session:   Stress:   . Feeling of Stress :   Social Connections:   . Frequency of Communication with Friends and Family:   . Frequency of Social Gatherings with Friends and Family:   . Attends Religious Services:   . Active Member of Clubs or Organizations:   . Attends Archivist Meetings:   Marland Kitchen Marital Status:   Intimate Partner Violence:   . Fear of Current or Ex-Partner:   . Emotionally Abused:   Marland Kitchen Physically Abused:   . Sexually Abused:     Past Surgical History:  Procedure Laterality Date  . SKIN CANCER EXCISION  2015  . TONSILLECTOMY      Family History  Problem Relation Age of Onset  . Breast cancer Mother        Died at 33  . Lung cancer Father   . Prostate cancer Father        Died at 24     No Known Allergies  Current Outpatient Medications on File Prior to Visit  Medication Sig Dispense Refill  . amLODipine (NORVASC) 10 MG tablet Take 1 tablet by mouth daily.  **DUE FOR YEARLY PHYSICAL** 90 tablet 0  . atorvastatin (LIPITOR) 20 MG tablet Take 1 tablet by mouth daily.  **DUE FOR YEARLY PHYSICAL** 90 tablet 0  . cyclobenzaprine (FLEXERIL) 5 MG tablet Take 1 tablet (5 mg total) by mouth 3 (three) times daily as needed for muscle spasms. 30 tablet 1  . ibuprofen (ADVIL) 200 MG tablet Take 200 mg by mouth every 6 (six) hours as needed.    Marland Kitchen lisinopril (ZESTRIL) 40 MG tablet Take 1 tablet (40 mg total) by mouth daily. 90 tablet 0  . meloxicam (MOBIC) 7.5 MG tablet Take 1 tablet (7.5 mg total) by mouth daily. **DUE FOR YEARLY PHYSICAL** 90 tablet 0  . QUEtiapine (SEROQUEL) 50 MG tablet Take 1 tablet by mouth at bedtime.  **DUE FOR YEARLY PHYSICAL** 30 tablet 0   No current facility-administered medications on file prior to visit.    BP (!) 164/88   Temp 99 F (37.2 C)   Ht 5' 9"  (1.753 m)   Wt 202 lb  (91.6 kg)   BMI 29.83 kg/m       Objective:   Physical Exam Vitals and nursing note reviewed.  Constitutional:      General: He is not in acute distress.    Appearance: Normal appearance. He is well-developed and normal weight.  HENT:     Head: Normocephalic and atraumatic.     Right Ear: Tympanic membrane, ear canal and external ear normal. There is no impacted cerumen.  Left Ear: Tympanic membrane, ear canal and external ear normal. There is no impacted cerumen.     Nose: Nose normal. No congestion or rhinorrhea.     Mouth/Throat:     Mouth: Mucous membranes are moist.     Pharynx: Oropharynx is clear. No oropharyngeal exudate or posterior oropharyngeal erythema.  Eyes:     General:        Right eye: No discharge.        Left eye: No discharge.     Extraocular Movements: Extraocular movements intact.     Conjunctiva/sclera: Conjunctivae normal.     Pupils: Pupils are equal, round, and reactive to light.  Neck:     Vascular: No carotid bruit.     Trachea: No tracheal deviation.  Cardiovascular:     Rate and Rhythm: Normal rate and regular rhythm.     Pulses: Normal pulses.     Heart sounds: Normal heart sounds. No murmur heard.  No friction rub. No gallop.   Pulmonary:     Effort: Pulmonary effort is normal. No respiratory distress.     Breath sounds: Normal breath sounds. No stridor. No wheezing, rhonchi or rales.  Chest:     Chest wall: No tenderness.  Abdominal:     General: Bowel sounds are normal. There is no distension.     Palpations: Abdomen is soft. There is no mass.     Tenderness: There is no abdominal tenderness. There is no right CVA tenderness, left CVA tenderness, guarding or rebound.     Hernia: No hernia is present.  Musculoskeletal:        General: No swelling, tenderness, deformity or signs of injury. Normal range of motion.     Right lower leg: No edema.     Left lower leg: No edema.  Lymphadenopathy:     Cervical: No cervical adenopathy.    Skin:    General: Skin is warm and dry.     Capillary Refill: Capillary refill takes less than 2 seconds.     Coloration: Skin is not jaundiced or pale.     Findings: No bruising, erythema, lesion or rash.  Neurological:     General: No focal deficit present.     Mental Status: He is alert and oriented to person, place, and time.     Cranial Nerves: No cranial nerve deficit.     Sensory: No sensory deficit.     Motor: No weakness.     Coordination: Coordination normal.     Gait: Gait normal.     Deep Tendon Reflexes: Reflexes normal.  Psychiatric:        Mood and Affect: Mood normal.        Behavior: Behavior normal.        Thought Content: Thought content normal.        Judgment: Judgment normal.       Assessment & Plan:  1. Routine general medical examination at a health care facility - continue exercise and heart healthy diet, needs moderate weight reduction  - follow up in one year or sooner if needed - CBC with Differential/Platelet; Future - Hemoglobin A1c; Future - Lipid panel; Future - TSH; Future - CMP with eGFR(Quest); Future - CMP with eGFR(Quest) - TSH - Lipid panel - Hemoglobin A1c - CBC with Differential/Platelet  2. Essential hypertension - Well controlled at home. No changes in medications  - CBC with Differential/Platelet; Future - Hemoglobin A1c; Future - Lipid panel; Future - TSH; Future - CMP with  eGFR(Quest); Future - CMP with eGFR(Quest) - TSH - Lipid panel - Hemoglobin A1c - CBC with Differential/Platelet  3. Mixed hyperlipidemia - Consider increase in statin  - CBC with Differential/Platelet; Future - Hemoglobin A1c; Future - Lipid panel; Future - TSH; Future - CMP with eGFR(Quest); Future - CMP with eGFR(Quest) - TSH - Lipid panel - Hemoglobin A1c - CBC with Differential/Platelet  4. Colon cancer screening  - Ambulatory referral to Gastroenterology  5. Primary insomnia - Continue with Seroquel   6. Glucose intolerance -  Consider adding metformin  - CBC with Differential/Platelet; Future - Hemoglobin A1c; Future - Lipid panel; Future - TSH; Future - CMP with eGFR(Quest); Future - CMP with eGFR(Quest) - TSH - Lipid panel - Hemoglobin A1c - CBC with Differential/Platelet  7. History of SCC (squamous cell carcinoma) of skin  - Ambulatory referral to Dermatology  8. Encounter for hepatitis C screening test for low risk patient  - Hepatitis C Antibody; Future - Hepatitis C Antibody  9. Prostate cancer screening  - PSA; Future - PSA   Dorothyann Peng, NP

## 2020-01-31 NOTE — Addendum Note (Signed)
Addended by: Miles Costain T on: 01/31/2020 01:15 PM   Modules accepted: Orders

## 2020-02-01 ENCOUNTER — Other Ambulatory Visit: Payer: Self-pay | Admitting: Adult Health

## 2020-02-01 DIAGNOSIS — E782 Mixed hyperlipidemia: Secondary | ICD-10-CM

## 2020-02-01 DIAGNOSIS — R972 Elevated prostate specific antigen [PSA]: Secondary | ICD-10-CM

## 2020-02-01 LAB — COMPLETE METABOLIC PANEL WITH GFR
AG Ratio: 2 (calc) (ref 1.0–2.5)
ALT: 33 U/L (ref 9–46)
AST: 22 U/L (ref 10–35)
Albumin: 4.6 g/dL (ref 3.6–5.1)
Alkaline phosphatase (APISO): 70 U/L (ref 35–144)
BUN: 17 mg/dL (ref 7–25)
CO2: 26 mmol/L (ref 20–32)
Calcium: 9.8 mg/dL (ref 8.6–10.3)
Chloride: 101 mmol/L (ref 98–110)
Creat: 0.92 mg/dL (ref 0.70–1.25)
GFR, Est African American: 100 mL/min/{1.73_m2} (ref >=60)
GFR, Est Non African American: 86 mL/min/{1.73_m2} (ref >=60)
Globulin: 2.3 g/dL (calc) (ref 1.9–3.7)
Glucose, Bld: 122 mg/dL — ABNORMAL HIGH (ref 65–99)
Potassium: 4 mmol/L (ref 3.5–5.3)
Sodium: 135 mmol/L (ref 135–146)
Total Bilirubin: 0.5 mg/dL (ref 0.2–1.2)
Total Protein: 6.9 g/dL (ref 6.1–8.1)

## 2020-02-01 LAB — CBC WITH DIFFERENTIAL/PLATELET
Absolute Monocytes: 686 cells/uL (ref 200–950)
Basophils Absolute: 101 cells/uL (ref 0–200)
Basophils Relative: 1.3 %
Eosinophils Absolute: 187 cells/uL (ref 15–500)
Eosinophils Relative: 2.4 %
HCT: 47.5 % (ref 38.5–50.0)
Hemoglobin: 15.9 g/dL (ref 13.2–17.1)
Lymphs Abs: 2020 cells/uL (ref 850–3900)
MCH: 29.5 pg (ref 27.0–33.0)
MCHC: 33.5 g/dL (ref 32.0–36.0)
MCV: 88.1 fL (ref 80.0–100.0)
MPV: 10.6 fL (ref 7.5–12.5)
Monocytes Relative: 8.8 %
Neutro Abs: 4805 cells/uL (ref 1500–7800)
Neutrophils Relative %: 61.6 %
Platelets: 246 10*3/uL (ref 140–400)
RBC: 5.39 10*6/uL (ref 4.20–5.80)
RDW: 13.4 % (ref 11.0–15.0)
Total Lymphocyte: 25.9 %
WBC: 7.8 10*3/uL (ref 3.8–10.8)

## 2020-02-01 LAB — PSA: PSA: 4.5 ng/mL — ABNORMAL HIGH (ref <=4.0)

## 2020-02-01 LAB — LIPID PANEL
Cholesterol: 219 mg/dL — ABNORMAL HIGH (ref <200)
HDL: 49 mg/dL (ref >=40)
LDL Cholesterol (Calc): 136 mg/dL (calc) — ABNORMAL HIGH
Non-HDL Cholesterol (Calc): 170 mg/dL (calc) — ABNORMAL HIGH (ref <130)
Total CHOL/HDL Ratio: 4.5 (calc) (ref <5.0)
Triglycerides: 203 mg/dL — ABNORMAL HIGH (ref <150)

## 2020-02-01 LAB — HEMOGLOBIN A1C
Hgb A1c MFr Bld: 5.6 % of total Hgb (ref <5.7)
Mean Plasma Glucose: 114 (calc)
eAG (mmol/L): 6.3 (calc)

## 2020-02-01 LAB — HEPATITIS C ANTIBODY
Hepatitis C Ab: NONREACTIVE
SIGNAL TO CUT-OFF: 0.01 (ref <1.00)

## 2020-02-01 LAB — TSH: TSH: 1.25 mIU/L (ref 0.40–4.50)

## 2020-02-01 MED ORDER — ATORVASTATIN CALCIUM 40 MG PO TABS: ORAL_TABLET | ORAL | 3 refills | Status: DC

## 2020-02-19 DIAGNOSIS — L814 Other melanin hyperpigmentation: Secondary | ICD-10-CM | POA: Diagnosis not present

## 2020-02-19 DIAGNOSIS — D229 Melanocytic nevi, unspecified: Secondary | ICD-10-CM | POA: Diagnosis not present

## 2020-02-19 DIAGNOSIS — L821 Other seborrheic keratosis: Secondary | ICD-10-CM | POA: Diagnosis not present

## 2020-02-19 DIAGNOSIS — D485 Neoplasm of uncertain behavior of skin: Secondary | ICD-10-CM | POA: Diagnosis not present

## 2020-02-19 DIAGNOSIS — L819 Disorder of pigmentation, unspecified: Secondary | ICD-10-CM | POA: Diagnosis not present

## 2020-02-29 ENCOUNTER — Other Ambulatory Visit: Payer: Self-pay

## 2020-02-29 ENCOUNTER — Other Ambulatory Visit: Payer: Medicare Other

## 2020-02-29 DIAGNOSIS — R972 Elevated prostate specific antigen [PSA]: Secondary | ICD-10-CM

## 2020-02-29 LAB — PSA: PSA: 4.2 ng/mL — ABNORMAL HIGH (ref <=4.0)

## 2020-03-04 ENCOUNTER — Other Ambulatory Visit: Payer: Self-pay | Admitting: Adult Health

## 2020-03-04 DIAGNOSIS — R972 Elevated prostate specific antigen [PSA]: Secondary | ICD-10-CM

## 2020-03-24 ENCOUNTER — Encounter: Payer: Self-pay | Admitting: Gastroenterology

## 2020-03-24 ENCOUNTER — Other Ambulatory Visit: Payer: Self-pay | Admitting: Adult Health

## 2020-03-24 DIAGNOSIS — I10 Essential (primary) hypertension: Secondary | ICD-10-CM

## 2020-04-10 ENCOUNTER — Telehealth: Payer: Self-pay | Admitting: Adult Health

## 2020-04-10 DIAGNOSIS — F5101 Primary insomnia: Secondary | ICD-10-CM

## 2020-04-10 NOTE — Telephone Encounter (Signed)
Pt call and need a refill on QUEtiapine (SEROQUEL) 50 MG tablet sent to  [id number] [place] [place] [address] Phone:  [phone]  Fax:  [phone]

## 2020-04-11 MED ORDER — QUETIAPINE FUMARATE 50 MG PO TABS: ORAL_TABLET | ORAL | 1 refills | Status: DC

## 2020-05-06 ENCOUNTER — Ambulatory Visit (AMBULATORY_SURGERY_CENTER): Payer: Self-pay | Admitting: *Deleted

## 2020-05-06 ENCOUNTER — Other Ambulatory Visit: Payer: Self-pay

## 2020-05-06 VITALS — Ht 69.0 in | Wt 204.0 lb

## 2020-05-06 DIAGNOSIS — Z1211 Encounter for screening for malignant neoplasm of colon: Secondary | ICD-10-CM

## 2020-05-06 MED ORDER — NA SULFATE-K SULFATE-MG SULF 17.5-3.13-1.6 GM/177ML PO SOLN: ORAL | 0 refills | Status: DC

## 2020-05-06 NOTE — Progress Notes (Signed)
Patient is here in-person for PV. Patient denies any allergies to eggs or soy. Patient denies any problems with anesthesia/sedation. Patient denies any oxygen use at home. Patient denies taking any diet/weight loss medications or blood thinners. Patient is not being treated for MRSA or C-diff. Patient is aware of our care-partner policy and [id number] safety protocol. EMMI education assigned to the patient for the procedure, sent to [facility].   COVID-19 vaccines completed on 10/03/19, per patient.

## 2020-05-07 ENCOUNTER — Encounter: Payer: Self-pay | Admitting: Gastroenterology

## 2020-05-08 ENCOUNTER — Other Ambulatory Visit: Payer: Self-pay | Admitting: Adult Health

## 2020-05-08 DIAGNOSIS — M19041 Primary osteoarthritis, right hand: Secondary | ICD-10-CM

## 2020-05-08 DIAGNOSIS — F5101 Primary insomnia: Secondary | ICD-10-CM

## 2020-05-08 DIAGNOSIS — M19042 Primary osteoarthritis, left hand: Secondary | ICD-10-CM

## 2020-05-08 NOTE — Telephone Encounter (Signed)
Received a refill request for:  Meloxicam 7.5 mg LR 12/25/19, #90, 0 rf LOV 01/31/20 FOV  None scheduled.    please review and advise.   Thanks.  Dm/cma

## 2020-05-12 ENCOUNTER — Telehealth: Payer: Self-pay | Admitting: Gastroenterology

## 2020-05-12 DIAGNOSIS — Z1211 Encounter for screening for malignant neoplasm of colon: Secondary | ICD-10-CM

## 2020-05-12 MED ORDER — SUTAB 1479-225-188 MG PO TABS: 1.0000 | ORAL_TABLET | ORAL | 0 refills | Status: DC

## 2020-05-12 NOTE — Telephone Encounter (Signed)
Patent called says the prep Suprep medication is costing him $100.00 or more please advise and or send an alternate medication and if so please advise him if the prep instructions will remain the same

## 2020-05-12 NOTE — Telephone Encounter (Signed)
Patient request cheaper prep. Sutab explained to the patient and he wants to use that prep. He will come by 3rd floor desk to get coupon and updated sutab prep instructions. sutab rx sent to pt's pharmacy today- pt is aware.

## 2020-05-20 ENCOUNTER — Encounter: Payer: Medicare Other | Admitting: Gastroenterology

## 2020-06-26 ENCOUNTER — Telehealth: Payer: Self-pay | Admitting: Adult Health

## 2020-06-26 ENCOUNTER — Other Ambulatory Visit: Payer: Self-pay | Admitting: Adult Health

## 2020-06-26 DIAGNOSIS — E782 Mixed hyperlipidemia: Secondary | ICD-10-CM

## 2020-06-26 NOTE — Telephone Encounter (Signed)
Patient would like for [facility] to schedule labs for Lipids and cholesterol before he has his appointment with Kandee Keen on 01/05.  Please Advise

## 2020-07-02 ENCOUNTER — Ambulatory Visit (INDEPENDENT_AMBULATORY_CARE_PROVIDER_SITE_OTHER): Payer: Medicare Other | Admitting: Adult Health

## 2020-07-02 ENCOUNTER — Other Ambulatory Visit: Payer: Self-pay

## 2020-07-02 ENCOUNTER — Encounter: Payer: Self-pay | Admitting: Adult Health

## 2020-07-02 VITALS — BP 156/92 | Temp 99.0°F | Wt 203.0 lb

## 2020-07-02 DIAGNOSIS — H9312 Tinnitus, left ear: Secondary | ICD-10-CM | POA: Diagnosis not present

## 2020-07-02 DIAGNOSIS — I10 Essential (primary) hypertension: Secondary | ICD-10-CM | POA: Diagnosis not present

## 2020-07-02 DIAGNOSIS — E782 Mixed hyperlipidemia: Secondary | ICD-10-CM | POA: Diagnosis not present

## 2020-07-02 DIAGNOSIS — M151 Heberden's nodes (with arthropathy): Secondary | ICD-10-CM

## 2020-07-02 DIAGNOSIS — H9192 Unspecified hearing loss, left ear: Secondary | ICD-10-CM | POA: Diagnosis not present

## 2020-07-02 LAB — BASIC METABOLIC PANEL
BUN: 16 mg/dL (ref 6–23)
CO2: 28 mEq/L (ref 19–32)
Calcium: 9.2 mg/dL (ref 8.4–10.5)
Chloride: 102 mEq/L (ref 96–112)
Creatinine, Ser: 0.85 mg/dL (ref 0.40–1.50)
GFR: 90.15 mL/min (ref >60.00)
Glucose, Bld: 114 mg/dL — ABNORMAL HIGH (ref 70–99)
Potassium: 4.4 mEq/L (ref 3.5–5.1)
Sodium: 134 mEq/L — ABNORMAL LOW (ref 135–145)

## 2020-07-02 LAB — LIPID PANEL
Cholesterol: 212 mg/dL — ABNORMAL HIGH (ref 0–200)
HDL: 44 mg/dL (ref >39.00)
LDL Cholesterol: 132 mg/dL — ABNORMAL HIGH (ref 0–99)
NonHDL: 168.1
Total CHOL/HDL Ratio: 5
Triglycerides: 183 mg/dL — ABNORMAL HIGH (ref 0.0–149.0)
VLDL: 36.6 mg/dL (ref 0.0–40.0)

## 2020-07-02 NOTE — Addendum Note (Signed)
Addended by: Nancy Fetter on: 07/02/2020 11:59 AM   Modules accepted: Orders

## 2020-07-02 NOTE — Addendum Note (Signed)
Addended by: Lerry Liner on: 07/02/2020 11:04 AM   Modules accepted: Orders

## 2020-07-02 NOTE — Progress Notes (Addendum)
Subjective:    Patient ID: Frank [name], male    DOB: 1950-10-22, 70 y.o.   MRN: [id number]  HPI  70 year old male who  has a past medical history of Anxiety, Depression, Drug addiction ([place]), ETOH abuse, High cholesterol, Hypertensive urgency, and Tinnitus.  He presents to the office today with multiple issues.  First issue being hypertension.  Is currently prescribed Norvasc 10 mg daily and lisinopril 40 mg daily.  He does report that he has been taking these medications on a daily basis.  He has been monitoring his blood pressure at home and reports readings that have been increasing over the last few weeks.  He has had readings between 120s over 70s and 160s over 100s.  Previously his blood pressure readings were consistently in the 120s to 130s over 80s.  Does report that he has been pretty sedentary, does eat out multiple times a week, has a higher salt diet than he would like, and does not drink a lot of water throughout the day.  Additionally, he would like to recheck his cholesterol panel.  His Lipitor was increased from 20 to 40 mg back in August 2021 for hyperlipidemia.  He has not had any side effects of this medication.  He would also like a referral to audiology for hearing loss and tinnitus in his left ear..  Does report that these are longstanding issues but feels as though the tinnitus is getting worse and starting to affect his quality of life.  He understands that there is likely nothing that can be done but would like to see ear nose and throat specialist just to make sure  Lastly, he has nodules on multiple fingers specifically of the left pointer finger that have become more painful and are impeding his activities.  He would like to see a hand specialist to see if there is anything that can be done    Review of Systems See HPI   Past Medical History:  Diagnosis Date  . Anxiety   . Depression   . Drug addiction ([place])   . ETOH abuse   . High cholesterol   .  Hypertensive urgency   . Tinnitus     Social History   Socioeconomic History  . Marital status: Divorced    Spouse name: Not on file  . Number of children: Not on file  . Years of education: Not on file  . Highest education level: Not on file  Occupational History  . Not on file  Tobacco Use  . Smoking status: Never Smoker  . Smokeless tobacco: Never Used  Vaping Use  . Vaping Use: Never used  Substance and Sexual Activity  . Alcohol use: Yes    Alcohol/week: 10.0 standard drinks    Types: 10 Glasses of wine per week  . Drug use: Yes    Types: Other-see comments, "Crack" cocaine, Cocaine, Marijuana    Comment: current marijuana use  twice a month   . Sexual activity: Not on file    Comment: oxycontin  Other Topics Concern  . 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    Social Determinants of Health   Financial Resource Strain: Not on file  Food Insecurity: Not on file  Transportation Needs: Not on file  Physical Activity: Not on file  Stress: Not on file  Social Connections:  Not on file  Intimate Partner Violence: Not on file    Past Surgical History:  Procedure Laterality Date  . SKIN CANCER EXCISION  2015  . TONSILLECTOMY    . WISDOM TOOTH EXTRACTION     nausea post op    Family History  Problem Relation Age of Onset  . Breast cancer Mother        Died at 35  . Lung cancer Father   . Prostate cancer Father        Died at 2   . Colon cancer Paternal Grandmother   . Esophageal cancer Neg Hx   . Rectal cancer Neg Hx   . Stomach cancer Neg Hx   . Colon polyps Neg Hx     No Known Allergies  Current Outpatient Medications on File Prior to Visit  Medication Sig Dispense Refill  . amLODipine (NORVASC) 10 MG tablet TAKE 1 TABLET BY MOUTH DAILY 90 tablet 0  . atorvastatin (LIPITOR) 40 MG tablet Take 1 tablet by mouth daily. 90 tablet 3  . ibuprofen (ADVIL) 200  MG tablet Take 200 mg by mouth every 6 (six) hours as needed.    Marland Kitchen lisinopril (ZESTRIL) 40 MG tablet Take 1 tablet (40 mg total) by mouth daily. 90 tablet 0  . meloxicam (MOBIC) 7.5 MG tablet TAKE 1 TABLET(7.5 MG) BY MOUTH DAILY 90 tablet 0  . QUEtiapine (SEROQUEL) 50 MG tablet TAKE 1 TABLET BY MOUTH AT BEDTIME 90 tablet 1  . Na Sulfate-K Sulfate-Mg Sulf 17.5-3.13-1.6 GM/177ML SOLN Suprep (no substitutions)-TAKE AS DIRECTED. (Patient not taking: Reported on 07/02/2020) 354 mL 0  . Sodium Sulfate-Mag Sulfate-KCl (SUTAB) 620 538 3857 MG TABS Take 1 kit by mouth as directed. MANUFACTURER CODES!! BIN: [id number] PCN: CN GROUP: [id number] MEMBER ID: [id number];QPR AS SECONDARY INSURANCE ;NO PRIOR AUTHORIZATION (Patient not taking: Reported on 07/02/2020) 24 tablet 0   No current facility-administered medications on file prior to visit.    BP (!) 156/92   Temp 99 F (37.2 C)   Wt 203 lb (92.1 kg)   BMI 29.98 kg/m       Objective:   Physical Exam Vitals and nursing note reviewed.  Constitutional:      Appearance: Normal appearance.  Cardiovascular:     Rate and Rhythm: Normal rate and regular rhythm.     Pulses: Normal pulses.     Heart sounds: Normal heart sounds.  Pulmonary:     Effort: Pulmonary effort is normal.  Musculoskeletal:        General: Normal range of motion.  Skin:    General: Skin is warm and dry.     Capillary Refill: Capillary refill takes less than 2 seconds.  Neurological:     General: No focal deficit present.     Mental Status: He is alert and oriented to person, place, and time.  Psychiatric:        Mood and Affect: Mood normal.        Behavior: Behavior normal.        Thought Content: Thought content normal.        Judgment: Judgment normal.        Assessment & Plan:  1. Mixed hyperlipidemia -Consider increase in statin - Basic Metabolic Panel; Future - Lipid panel; Future - Lipid panel - Basic Metabolic Panel  2. Essential hypertension -We discussed  adding another medication or waiting a few weeks and have him work on lifestyle modifications.  He would rather work on lifestyle modifications.  He will continue  to monitor his blood pressure, increase exercise, drink more water, and have a low sodium diet.  If blood pressure does not resolve and he has was advised to follow-up in 2 to 3 weeks - Basic Metabolic Panel; Future - Basic Metabolic Panel  3. Hearing loss of left ear, unspecified hearing loss type  - Ambulatory referral to ENT  4. Tinnitus of left ear  - Ambulatory referral to ENT  5. Heberden's nodes of both hands  - Ambulatory referral to Hand Surgery   Dorothyann Peng, NP

## 2020-07-07 ENCOUNTER — Telehealth: Payer: Self-pay | Admitting: Gastroenterology

## 2020-07-07 DIAGNOSIS — Z1211 Encounter for screening for malignant neoplasm of colon: Secondary | ICD-10-CM

## 2020-07-07 MED ORDER — SUTAB 1479-225-188 MG PO TABS: 1.0000 | ORAL_TABLET | ORAL | 0 refills | Status: DC

## 2020-07-07 NOTE — Telephone Encounter (Signed)
sutab rx sent to patient's pharmacy per his request.

## 2020-07-07 NOTE — Telephone Encounter (Signed)
Patient is requesting that the prep medication gets resent to pharmacy

## 2020-07-11 ENCOUNTER — Encounter: Payer: Self-pay | Admitting: Gastroenterology

## 2020-07-11 ENCOUNTER — Ambulatory Visit (AMBULATORY_SURGERY_CENTER): Payer: Medicare Other | Admitting: Gastroenterology

## 2020-07-11 ENCOUNTER — Other Ambulatory Visit: Payer: Self-pay

## 2020-07-11 VITALS — BP 128/82 | HR 64 | Temp 96.9°F | Resp 12 | Ht 69.0 in | Wt 204.0 lb

## 2020-07-11 DIAGNOSIS — D122 Benign neoplasm of ascending colon: Secondary | ICD-10-CM | POA: Diagnosis not present

## 2020-07-11 DIAGNOSIS — Z1211 Encounter for screening for malignant neoplasm of colon: Secondary | ICD-10-CM

## 2020-07-11 DIAGNOSIS — D127 Benign neoplasm of rectosigmoid junction: Secondary | ICD-10-CM

## 2020-07-11 DIAGNOSIS — D123 Benign neoplasm of transverse colon: Secondary | ICD-10-CM | POA: Diagnosis not present

## 2020-07-11 MED ORDER — SODIUM CHLORIDE 0.9 % IV SOLN: 500.0000 mL | Freq: Once | INTRAVENOUS | Status: DC

## 2020-07-11 NOTE — Patient Instructions (Signed)
Handouts Provided:  Polyps and Diverticulosis ° °YOU HAD AN ENDOSCOPIC PROCEDURE TODAY AT THE [facility]:   Refer to the procedure report that was given to you for any specific questions about what was found during the examination.  If the procedure report does not answer your questions, please call your gastroenterologist to clarify.  If you requested that your care partner not be given the details of your procedure findings, then the procedure report has been included in a sealed envelope for you to review at your convenience later. ° °YOU SHOULD EXPECT: Some feelings of bloating in the abdomen. Passage of more gas than usual.  Walking can help get rid of the air that was put into your GI tract during the procedure and reduce the bloating. If you had a lower endoscopy (such as a colonoscopy or flexible sigmoidoscopy) you may notice spotting of blood in your stool or on the toilet paper. If you underwent a bowel prep for your procedure, you may not have a normal bowel movement for a few days. ° °Please Note:  You might notice some irritation and congestion in your nose or some drainage.  This is from the oxygen used during your procedure.  There is no need for concern and it should clear up in a day or so. ° °SYMPTOMS TO REPORT IMMEDIATELY: ° °Following lower endoscopy (colonoscopy or flexible sigmoidoscopy): ° Excessive amounts of blood in the stool ° Significant tenderness or worsening of abdominal pains ° Swelling of the abdomen that is new, acute ° Fever of 100°F or higher ° °For urgent or emergent issues, a gastroenterologist can be reached at any hour by calling [phone]. °Do not use MyChart messaging for urgent concerns.  ° ° °DIET:  We do recommend a small meal at first, but then you may proceed to your regular diet.  Drink plenty of fluids but you should avoid alcoholic beverages for 24 hours. ° °ACTIVITY:  You should plan to take it easy for the rest of today and you should NOT DRIVE  or use heavy machinery until tomorrow (because of the sedation medicines used during the test).   ° °FOLLOW UP: °Our staff will call the number listed on your records 48-72 hours following your procedure to check on you and address any questions or concerns that you may have regarding the information given to you following your procedure. If we do not reach you, we will leave a message.  We will attempt to reach you two times.  During this call, we will ask if you have developed any symptoms of COVID 19. If you develop any symptoms (ie: fever, flu-like symptoms, shortness of breath, cough etc.) before then, please call [phone].  If you test positive for Covid 19 in the 2 weeks post procedure, please call and report this information to us.   ° °If any biopsies were taken you will be contacted by phone or by letter within the next 1-3 weeks.  Please call us at [phone] if you have not heard about the biopsies in 3 weeks.  ° ° °SIGNATURES/CONFIDENTIALITY: °You and/or your care partner have signed paperwork which will be entered into your electronic medical record.  These signatures attest to the fact that that the information above on your After Visit Summary has been reviewed and is understood.  Full responsibility of the confidentiality of this discharge information lies with you and/or your care-partner. ° °

## 2020-07-11 NOTE — Progress Notes (Signed)
Medical history reviewed. VS assessed by C.W 

## 2020-07-11 NOTE — Progress Notes (Signed)
PT taken to PACU. Monitors in place. VSS. Report given to RN. 

## 2020-07-11 NOTE — Op Note (Signed)
[facility] Patient Name: Frank [name] Procedure Date: 07/11/2020 11:19 AM MRN: [id number] Endoscopist: Viviann Spare P. Adela Lank , MD Age: 70 Referring MD:  Date of Birth: [birth year] Gender: Male Account #: [id number] Procedure:                Colonoscopy Indications:              Screening for colorectal malignant neoplasm, This                            is the patient's first colonoscopy Medicines:                Monitored Anesthesia Care Procedure:                Pre-Anesthesia Assessment:                           - Prior to the procedure, a History and Physical                            was performed, and patient medications and                            allergies were reviewed. The patient's tolerance of                            previous anesthesia was also reviewed. The risks                            and benefits of the procedure and the sedation                            options and risks were discussed with the patient.                            All questions were answered, and informed consent                            was obtained. Prior Anticoagulants: The patient has                            taken no previous anticoagulant or antiplatelet                            agents. ASA Grade Assessment: II - A patient with                            mild systemic disease. After reviewing the risks                            and benefits, the patient was deemed in                            satisfactory condition to undergo the procedure.  After obtaining informed consent, the colonoscope                            was passed under direct vision. Throughout the                            procedure, the patient's blood pressure, pulse, and                            oxygen saturations were monitored continuously. The                            Olympus [id number]) [id number] was introduced                            through the anus and  advanced to the the cecum,                            identified by appendiceal orifice and ileocecal                            valve. The colonoscopy was performed without                            difficulty. The patient tolerated the procedure                            well. The quality of the bowel preparation was                            good. The ileocecal valve, appendiceal orifice, and                            rectum were photographed. Scope In: 11:27:03 AM Scope Out: 11:45:30 AM Scope Withdrawal Time: 0 hours 14 minutes 42 seconds  Total Procedure Duration: 0 hours 18 minutes 27 seconds  Findings:                 The perianal and digital rectal examinations were                            normal.                           Two flat and sessile polyps were found in the                            transverse colon. The polyps were 4 to 5 mm in                            size. These polyps were removed with a cold snare.                            Resection and retrieval were complete.  A 4 mm polyp was found in the recto-sigmoid colon.                            The polyp was sessile. The polyp was removed with a                            cold snare. Resection and retrieval were complete.                           A few small-mouthed diverticula were found in the                            sigmoid colon.                           Anal papilla(e) were hypertrophied.                           Internal hemorrhoids were found during                            retroflexion. The hemorrhoids were small.                           The exam was otherwise without abnormality. Complications:            No immediate complications. Estimated blood loss:                            Minimal. Estimated Blood Loss:     Estimated blood loss was minimal. Impression:               - Two 4 to 5 mm polyps in the transverse colon,                            removed with a  cold snare. Resected and retrieved.                           - One 4 mm polyp at the recto-sigmoid colon,                            removed with a cold snare. Resected and retrieved.                           - Diverticulosis in the sigmoid colon.                           - Anal papilla(e) were hypertrophied.                           - Internal hemorrhoids.                           - The examination was otherwise normal. Recommendation:           - Patient has a contact number  available for                            emergencies. The signs and symptoms of potential                            delayed complications were discussed with the                            patient. Return to normal activities tomorrow.                            Written discharge instructions were provided to the                            patient.                           - Resume previous diet.                           - Continue present medications.                           - Await pathology results. Remo Lipps P. Armbruster, MD 07/11/2020 11:51:26 AM This report has been signed electronically.

## 2020-07-15 ENCOUNTER — Telehealth: Payer: Self-pay

## 2020-07-15 NOTE — Telephone Encounter (Signed)
Covid-19 screening questions   Do you now or have you had a fever in the last 14 days? No. Do you have any respiratory symptoms of shortness of breath or cough now or in the last 14 days? No.  Do you have any family members or close contacts with diagnosed or suspected Covid-19 in the past 14 days? No.  Have you been tested for Covid-19 and found to be positive? No.       Follow up Call-  Call back number 07/11/2020  Post procedure Call Back phone  # [phone]  Permission to leave phone message Yes  Some recent data might be hidden     Patient questions:  Do you have a fever, pain , or abdominal swelling? No. Pain Score  0 *  Have you tolerated food without any problems? Yes.    Have you been able to return to your normal activities? Yes.    Do you have any questions about your discharge instructions: Diet   No. Medications  No. Follow up visit  No.  Do you have questions or concerns about your Care? No.  Actions: * If pain score is 4 or above: No action needed, pain <4.

## 2020-07-17 LAB — PSA: PSA: 5.41

## 2020-07-18 ENCOUNTER — Other Ambulatory Visit: Payer: Self-pay | Admitting: Adult Health

## 2020-07-18 DIAGNOSIS — I10 Essential (primary) hypertension: Secondary | ICD-10-CM

## 2020-07-18 NOTE — Telephone Encounter (Signed)
Sent to the pharmacy by e-scribe. 

## 2020-07-21 DIAGNOSIS — M19042 Primary osteoarthritis, left hand: Secondary | ICD-10-CM | POA: Diagnosis not present

## 2020-07-21 DIAGNOSIS — M19041 Primary osteoarthritis, right hand: Secondary | ICD-10-CM | POA: Diagnosis not present

## 2020-07-24 ENCOUNTER — Other Ambulatory Visit: Payer: Self-pay | Admitting: Adult Health

## 2020-07-24 DIAGNOSIS — I10 Essential (primary) hypertension: Secondary | ICD-10-CM

## 2020-08-06 DIAGNOSIS — H9313 Tinnitus, bilateral: Secondary | ICD-10-CM | POA: Diagnosis not present

## 2020-08-06 DIAGNOSIS — H833X3 Noise effects on inner ear, bilateral: Secondary | ICD-10-CM | POA: Diagnosis not present

## 2020-08-06 DIAGNOSIS — H903 Sensorineural hearing loss, bilateral: Secondary | ICD-10-CM | POA: Diagnosis not present

## 2020-08-07 ENCOUNTER — Encounter: Payer: Self-pay | Admitting: Family Medicine

## 2020-08-13 DIAGNOSIS — M19042 Primary osteoarthritis, left hand: Secondary | ICD-10-CM | POA: Diagnosis not present

## 2020-08-13 DIAGNOSIS — G5612 Other lesions of median nerve, left upper limb: Secondary | ICD-10-CM | POA: Diagnosis not present

## 2020-08-13 DIAGNOSIS — R202 Paresthesia of skin: Secondary | ICD-10-CM | POA: Diagnosis not present

## 2020-08-13 DIAGNOSIS — R200 Anesthesia of skin: Secondary | ICD-10-CM | POA: Diagnosis not present

## 2020-08-13 DIAGNOSIS — M19041 Primary osteoarthritis, right hand: Secondary | ICD-10-CM | POA: Diagnosis not present

## 2020-08-13 DIAGNOSIS — M5412 Radiculopathy, cervical region: Secondary | ICD-10-CM | POA: Diagnosis not present

## 2020-08-14 ENCOUNTER — Telehealth: Payer: Self-pay | Admitting: Adult Health

## 2020-08-14 NOTE — Progress Notes (Signed)
  Chronic Care Management   Note  08/14/2020 Name: LAWAYNE HARTIG MRN: [id number] DOB: [birth year]  Iris Pert is a 69 y.o. year old male who is a primary care patient of Dorothyann Peng, NP. I reached out to Lasana by phone today in response to a referral sent by Mr. Rameses Ou O'Hare's PCP, Dorothyann Peng, NP.   [name] was given information about Chronic Care Management services today including:  1. CCM service includes personalized support from designated clinical staff supervised by his physician, including individualized plan of care and coordination with other care providers 2. 24/7 contact phone numbers for assistance for urgent and routine care needs. 3. Service will only be billed when office clinical staff spend 20 minutes or more in a month to coordinate care. 4. Only one practitioner may furnish and bill the service in a calendar month. 5. The patient may stop CCM services at any time (effective at the end of the month) by phone call to the office staff.   Patient agreed to services and verbal consent obtained.   Follow up plan:   Carley Perdue [employer] Scheduler

## 2020-08-20 ENCOUNTER — Other Ambulatory Visit: Payer: Self-pay

## 2020-08-20 ENCOUNTER — Encounter (HOSPITAL_COMMUNITY): Payer: Self-pay

## 2020-08-20 ENCOUNTER — Emergency Department (HOSPITAL_COMMUNITY)
Admission: EM | Admit: 2020-08-20 | Discharge: 2020-08-20 | Disposition: A | Discharge status: Home / Self Care | Payer: Medicare Other | Attending: Emergency Medicine | Admitting: Emergency Medicine

## 2020-08-20 DIAGNOSIS — I10 Essential (primary) hypertension: Secondary | ICD-10-CM | POA: Diagnosis not present

## 2020-08-20 DIAGNOSIS — R030 Elevated blood-pressure reading, without diagnosis of hypertension: Secondary | ICD-10-CM

## 2020-08-20 DIAGNOSIS — R42 Dizziness and giddiness: Secondary | ICD-10-CM | POA: Insufficient documentation

## 2020-08-20 DIAGNOSIS — Z79899 Other long term (current) drug therapy: Secondary | ICD-10-CM | POA: Insufficient documentation

## 2020-08-20 DIAGNOSIS — E871 Hypo-osmolality and hyponatremia: Secondary | ICD-10-CM | POA: Diagnosis not present

## 2020-08-20 LAB — CBC
HCT: 46 % (ref 39.0–52.0)
Hemoglobin: 15.8 g/dL (ref 13.0–17.0)
MCH: 29 pg (ref 26.0–34.0)
MCHC: 34.3 g/dL (ref 30.0–36.0)
MCV: 84.6 fL (ref 80.0–100.0)
Platelets: 220 10*3/uL (ref 150–400)
RBC: 5.44 MIL/uL (ref 4.22–5.81)
RDW: 13.4 % (ref 11.5–15.5)
WBC: 7.2 10*3/uL (ref 4.0–10.5)
nRBC: 0 % (ref 0.0–0.2)

## 2020-08-20 LAB — URINALYSIS, ROUTINE W REFLEX MICROSCOPIC
Bilirubin Urine: NEGATIVE
Glucose, UA: NEGATIVE mg/dL
Hgb urine dipstick: NEGATIVE
Ketones, ur: NEGATIVE mg/dL
Leukocytes,Ua: NEGATIVE
Nitrite: NEGATIVE
Protein, ur: NEGATIVE mg/dL
Specific Gravity, Urine: 1.003 — ABNORMAL LOW (ref 1.005–1.030)
pH: 7 (ref 5.0–8.0)

## 2020-08-20 LAB — BASIC METABOLIC PANEL
Anion gap: 10 (ref 5–15)
BUN: 14 mg/dL (ref 8–23)
CO2: 22 mmol/L (ref 22–32)
Calcium: 9.1 mg/dL (ref 8.9–10.3)
Chloride: 103 mmol/L (ref 98–111)
Creatinine, Ser: 0.77 mg/dL (ref 0.61–1.24)
GFR, Estimated: >60 mL/min (ref >60)
Glucose, Bld: 126 mg/dL — ABNORMAL HIGH (ref 70–99)
Potassium: 4.1 mmol/L (ref 3.5–5.1)
Sodium: 135 mmol/L (ref 135–145)

## 2020-08-20 LAB — CBG MONITORING, ED: Glucose-Capillary: 132 mg/dL — ABNORMAL HIGH (ref 70–99)

## 2020-08-20 NOTE — ED Triage Notes (Signed)
Pt presents with c/o dizziness and hypertension. Pt reports he does take medication for his BP and has been monitoring it. Pt reports the dizziness started today.

## 2020-08-20 NOTE — ED Provider Notes (Signed)
[facility] DEPT Provider Note   CSN: [id number] Arrival date & time: 08/20/20  1129     History Chief Complaint  Patient presents with  . Hypertension  . Dizziness    Frank [name] is a 70 y.o. male.  Patient is a 70 year old male who presents with dizziness and high blood pressure.  He has a history of hypertension, hyperlipidemia and prior alcohol, substance abuse.  He states that this morning he was feeling a little bit dizzy.  He said he just felt a little bit off.  He then got little bit anxious and checked his blood pressure and noted to be elevated with a systolic blood pressure around 190.  At that time he felt like his heart was racing a bit but he did not feel like he noted any palpitations prior to that.  He was not sure if it was anxiety or actual palpitations.  He denies any chest pain or discomfort.  No shortness of breath.  No difficulty with his balance.  No difficulty with speech or vision.  No numbness or weakness to his extremities.  He said he feels better now and denies any current symptoms.  His blood pressure has improved and he no longer has dizziness.  He has been ambulating in the ED with no symptoms.        Past Medical History:  Diagnosis Date  . Anxiety   . Depression   . Drug addiction ([place])   . ETOH abuse   . High cholesterol   . Hypertensive urgency   . Tinnitus     Patient Active Problem List   Diagnosis Date Noted  . Mixed hyperlipidemia 10/26/2018  . Essential hypertension 10/26/2018  . Primary insomnia 10/26/2018  . Elevated glucose 10/26/2018  . Hyponatremia 10/17/2018  . MDD (major depressive disorder) 05/20/2012  . Chronic pain associated with significant psychosocial dysfunction 04/01/2012  . Drug addiction [facility])     Past Surgical History:  Procedure Laterality Date  . SKIN CANCER EXCISION  2015  . TONSILLECTOMY    . WISDOM TOOTH EXTRACTION     nausea post op       Family History  Problem  Relation Age of Onset  . Breast cancer Mother        Died at 37  . Lung cancer Father   . Prostate cancer Father        Died at 45   . Colon cancer Paternal Grandmother   . Esophageal cancer Neg Hx   . Rectal cancer Neg Hx   . Stomach cancer Neg Hx   . Colon polyps Neg Hx     Social History   Tobacco Use  . Smoking status: Never Smoker  . Smokeless tobacco: Never Used  Vaping Use  . Vaping Use: Never used  Substance Use Topics  . Alcohol use: Yes    Alcohol/week: 10.0 standard drinks    Types: 10 Glasses of wine per week  . Drug use: Yes    Types: Other-see comments, "Crack" cocaine, Cocaine, Marijuana    Comment: current marijuana use  twice a month     Home Medications Prior to Admission medications   Medication Sig Start Date End Date Taking? Authorizing Provider  amLODipine (NORVASC) 10 MG tablet TAKE 1 TABLET BY MOUTH DAILY 07/24/20   Nafziger, Tommi Rumps, NP  atorvastatin (LIPITOR) 40 MG tablet Take 1 tablet by mouth daily. 02/01/20   Nafziger, Tommi Rumps, NP  ibuprofen (ADVIL) 200 MG tablet Take 200  mg by mouth every 6 (six) hours as needed.    [provider]  lisinopril (ZESTRIL) 40 MG tablet TAKE 1 TABLET BY MOUTH EVERY DAY 07/18/20   Nafziger, Tommi Rumps, NP  meloxicam (MOBIC) 7.5 MG tablet TAKE 1 TABLET(7.5 MG) BY MOUTH DAILY 05/08/20   Nafziger, Tommi Rumps, NP  QUEtiapine (SEROQUEL) 50 MG tablet TAKE 1 TABLET BY MOUTH AT BEDTIME 05/08/20   Dorothyann Peng, NP    Allergies    Patient has no known allergies.  Review of Systems   Review of Systems  Constitutional: Negative for chills, diaphoresis, fatigue and fever.  HENT: Negative for congestion, rhinorrhea and sneezing.   Eyes: Negative.   Respiratory: Negative for cough, chest tightness and shortness of breath.   Cardiovascular: Positive for palpitations. Negative for chest pain and leg swelling.  Gastrointestinal: Negative for abdominal pain, blood in stool, diarrhea, nausea and vomiting.  Genitourinary: Negative for  difficulty urinating, flank pain, frequency and hematuria.  Musculoskeletal: Negative for arthralgias and back pain.  Skin: Negative for rash.  Neurological: Positive for light-headedness. Negative for dizziness, speech difficulty, weakness, numbness and headaches.  Psychiatric/Behavioral: The patient is nervous/anxious.     Physical Exam Updated Vital Signs BP (!) 146/95   Pulse 74   Temp 98.2 F (36.8 C) (Oral) Comment: Simultaneous filing. User may not have seen previous data. Comment (Src): Simultaneous filing. User may not have seen previous data.  Resp 10   Ht 5\' 9"  (1.753 m)   Wt 92 kg   SpO2 98%   BMI 29.95 kg/m   Physical Exam Constitutional:      Appearance: He is well-developed and well-nourished.  HENT:     Head: Normocephalic and atraumatic.  Eyes:     Pupils: Pupils are equal, round, and reactive to light.  Cardiovascular:     Rate and Rhythm: Normal rate and regular rhythm.     Heart sounds: Normal heart sounds.  Pulmonary:     Effort: Pulmonary effort is normal. No respiratory distress.     Breath sounds: Normal breath sounds. No wheezing or rales.  Chest:     Chest wall: No tenderness.  Abdominal:     General: Bowel sounds are normal.     Palpations: Abdomen is soft.     Tenderness: There is no abdominal tenderness. There is no guarding or rebound.  Musculoskeletal:        General: No edema. Normal range of motion.     Cervical back: Normal range of motion and neck supple.  Lymphadenopathy:     Cervical: No cervical adenopathy.  Skin:    General: Skin is warm and dry.     Findings: No rash.  Neurological:     Mental Status: He is alert and oriented to person, place, and time.     Comments: Motor 5/5 all extremities Sensation grossly intact to LT all extremities Finger to Nose intact, no pronator drift CN II-XII grossly intact Gait normal   Psychiatric:        Mood and Affect: Mood and affect normal.     ED Results / Procedures / Treatments    Labs (all labs ordered are listed, but only abnormal results are displayed) Labs Reviewed  BASIC METABOLIC PANEL - Abnormal; Notable for the following components:      Result Value   Glucose, Bld 126 (*)    All other components within normal limits  CBG MONITORING, ED - Abnormal; Notable for the following components:   Glucose-Capillary 132 (*)  All other components within normal limits  CBC  URINALYSIS, ROUTINE W REFLEX MICROSCOPIC    EKG None   ED ECG REPORT   Date: 08/20/2020  Rate: 85  Rhythm: normal sinus rhythm  QRS Axis: normal  Intervals: normal  ST/T Wave abnormalities: nonspecific ST/T changes  Conduction Disutrbances:none  Narrative Interpretation:   Old EKG Reviewed: unchanged  I have personally reviewed the EKG tracing and agree with the computerized printout as noted.   Radiology No results found.  Procedures Procedures   Medications Ordered in ED Medications - No data to display  ED Course  I have reviewed the triage vital signs and the nursing notes.  Pertinent labs & imaging results that were available during my care of the patient were reviewed by me and considered in my medical decision making (see chart for details).    MDM Rules/Calculators/A&P                          Patient is a 70 year old male who presents with elevated blood pressure and lightheadedness.  His symptoms have resolved.  His blood pressure has improved.  His EKG does not show any ischemic changes or arrhythmias.  His neuro exam is intact without any focal neurologic deficits.  His labs are nonconcerning.  He was discharged home in good condition.  He was encouraged to follow-up with his PCP to have his blood pressure rechecked.  Return precautions were given. Final Clinical Impression(s) / ED Diagnoses Final diagnoses:  None    Rx / DC Orders ED Discharge Orders    None       Malvin Johns, MD 08/20/20 1400

## 2020-08-29 ENCOUNTER — Other Ambulatory Visit: Payer: Self-pay | Admitting: Adult Health

## 2020-08-29 DIAGNOSIS — I10 Essential (primary) hypertension: Secondary | ICD-10-CM

## 2020-09-11 NOTE — Progress Notes (Signed)
Subjective:   EMIGDIO WILDEMAN is a 70 y.o. male who presents for an Initial Medicare Annual Wellness Visit.  Attempted video visit and unfortunately there was no audio with visit. Visit changed to a telephone only visit.   I connected with Delton See today by telephone and verified that I am speaking with the correct person using two identifiers. Location patient: home Location provider: work Persons participating in the virtual visit: patient, provider.   I discussed the limitations, risks, security and privacy concerns of performing an evaluation and management service by telephone and the availability of in person appointments. I also discussed with the patient that there may be a patient responsible charge related to this service. The patient expressed understanding and verbally consented to this telephonic visit.    Interactive audio and video telecommunications were attempted between this provider and patient, however failed, due to patient having technical difficulties OR patient did not have access to video capability.  We continued and completed visit with audio only.      Review of Systems    N/A  Cardiac Risk Factors include: advanced age (>29men, >73 women);male gender;hypertension;dyslipidemia     Objective:    Today's Vitals   09/12/20 0905  PainSc: 3    There is no height or weight on file to calculate BMI.  Advanced Directives 09/12/2020 08/20/2020 10/17/2018 10/16/2018 08/14/2016 08/14/2016 05/18/2012  Does Patient Have a Medical Advance Directive? No No No No No No Patient does not have advance directive;Patient would not like information  Would patient like information on creating a medical advance directive? No - Patient declined No - Patient declined No - Patient declined - No - Patient declined - -  Pre-existing out of facility DNR order (yellow form or pink MOST form) - - - - - - -  Some encounter information is confidential and restricted. Go to Review Flowsheets  activity to see all data.    Current Medications (verified) Outpatient Encounter Medications as of 09/12/2020  Medication Sig  . amLODipine (NORVASC) 10 MG tablet Take 1 tablet (10 mg total) by mouth daily.  Marland Kitchen atorvastatin (LIPITOR) 40 MG tablet Take 1 tablet by mouth daily. (Patient taking differently: Take 40 mg by mouth daily.)  . fluticasone (FLONASE) 50 MCG/ACT nasal spray Place 1-2 sprays into both nostrils daily as needed for allergies or rhinitis.  Marland Kitchen ibuprofen (ADVIL) 200 MG tablet Take 200 mg by mouth every 6 (six) hours as needed for fever or mild pain.  Marland Kitchen lisinopril (ZESTRIL) 40 MG tablet TAKE 1 TABLET BY MOUTH EVERY DAY (Patient taking differently: Take 40 mg by mouth daily.)  . meloxicam (MOBIC) 7.5 MG tablet TAKE 1 TABLET(7.5 MG) BY MOUTH DAILY (Patient taking differently: Take 7.5 mg by mouth daily.)  . QUEtiapine (SEROQUEL) 50 MG tablet TAKE 1 TABLET BY MOUTH AT BEDTIME (Patient taking differently: Take 50 mg by mouth at bedtime.)   No facility-administered encounter medications on file as of 09/12/2020.    Allergies (verified) Patient has no known allergies.   History: Past Medical History:  Diagnosis Date  . Anxiety   . Depression   . Drug addiction ([place])   . ETOH abuse   . High cholesterol   . Hypertensive urgency   . Tinnitus    Past Surgical History:  Procedure Laterality Date  . SKIN CANCER EXCISION  2015  . TONSILLECTOMY    . WISDOM TOOTH EXTRACTION     nausea post op   Family History  Problem Relation  Age of Onset  . Breast cancer Mother        Died at 57  . Lung cancer Father   . Prostate cancer Father        Died at 74   . Colon cancer Paternal Grandmother   . Esophageal cancer Neg Hx   . Rectal cancer Neg Hx   . Stomach cancer Neg Hx   . Colon polyps Neg Hx    Social History   Socioeconomic History  . Marital status: Divorced    Spouse name: Not on file  . Number of children: Not on file  . Years of education: Not on file  . Highest  education level: Not on file  Occupational History  . Not on file  Tobacco Use  . Smoking status: Never Smoker  . Smokeless tobacco: Never Used  Vaping Use  . Vaping Use: Never used  Substance and Sexual Activity  . Alcohol use: Yes    Alcohol/week: 10.0 standard drinks    Types: 10 Glasses of wine per week  . Drug use: Yes    Types: Other-see comments, "Crack" cocaine, Cocaine, Marijuana    Comment: current marijuana use  twice a month   . Sexual activity: Not on file    Comment: oxycontin  Other Topics Concern  . 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    Social Determinants of Health   Financial Resource Strain: Low Risk   . Difficulty of Paying Living Expenses: Not hard at all  Food Insecurity: No Food Insecurity  . Worried About Charity fundraiser in the Last Year: Never true  . Ran Out of Food in the Last Year: Never true  Transportation Needs: No Transportation Needs  . Lack of Transportation (Medical): No  . Lack of Transportation (Non-Medical): No  Physical Activity: Insufficiently Active  . Days of Exercise per Week: 3 days  . Minutes of Exercise per Session: 30 min  Stress: No Stress Concern Present  . Feeling of Stress : Not at all  Social Connections: Socially Isolated  . Frequency of Communication with Friends and Family: More than three times a week  . Frequency of Social Gatherings with Friends and Family: More than three times a week  . Attends Religious Services: Never  . Active Member of Clubs or Organizations: No  . Attends Archivist Meetings: Never  . Marital Status: Divorced    Tobacco Counseling Counseling given: Not Answered   Clinical Intake:  Pre-visit preparation completed: Yes  Pain : 0-10 Pain Score: 3  Pain Type: Chronic pain Pain Location: Neck (Back) Pain Descriptors / Indicators: Aching Pain Onset: More than a month  ago Pain Frequency: Constant     Nutritional Risks: None Diabetes: No  How often do you need to have someone help you when you read instructions, pamphlets, or other written materials from your doctor or pharmacy?: 1 - Never  Diabetic?No   Interpreter Needed?: No  Information entered by :: [place] of Daily Living In your present state of health, do you have any difficulty performing the following activities: 09/12/2020  Hearing? Y  Comment has hearing loss  Vision? N  Difficulty concentrating or making decisions? N  Walking or climbing stairs? N  Dressing or bathing? N  Doing errands, shopping? N  Preparing Food and eating ? N  Using the Toilet? N  In the past six months, have you accidently leaked urine? N  Do you have problems with loss of bowel control? N  Managing your Medications? N  Managing your Finances? N  Housekeeping or managing your Housekeeping? N  Some recent data might be hidden    Patient Care Team: Dorothyann Peng, NP as PCP - General (Family Medicine) [facility], [facility] (Urology) Viona Gilmore, [facility] as Pharmacist (Pharmacist)  Indicate any recent Medical Services you may have received from other than [facility] providers in the past year (date may be approximate).     Assessment:   This is a routine wellness examination for [employer].  Hearing/Vision screen  Hearing Screening   125Hz  250Hz  500Hz  1000Hz  2000Hz  3000Hz  4000Hz  6000Hz  8000Hz   Right ear:           Left ear:           Vision Screening Comments: Patient states has not had and eye exam in over 10 years   Dietary issues and exercise activities discussed: Current Exercise Habits: Home exercise routine, Type of exercise: strength training/weights;walking, Time (Minutes): 30, Frequency (Times/Week): 3, Weekly Exercise (Minutes/Week): 90, Intensity: Mild, Exercise limited by: None identified  Goals    . Patient Stated     I want to continue working on my music and art  and travel      Depression Screen PHQ 2/9 Scores 09/12/2020 01/31/2020  PHQ - 2 Score 0 0    Fall Risk Fall Risk  09/12/2020 01/31/2020  Falls in the past year? 0 1  Number falls in past yr: 0 0  Injury with Fall? 0 1  Risk for fall due to : No Fall Risks -  Follow up Falls evaluation completed -    FALL RISK PREVENTION PERTAINING TO THE HOME:  Any stairs in or around the home? No  If so, are there any without handrails? No  Home free of loose throw rugs in walkways, pet beds, electrical cords, etc? Yes  Adequate lighting in your home to reduce risk of falls? Yes   ASSISTIVE DEVICES UTILIZED TO PREVENT FALLS:  Life alert? No  Use of a cane, walker or w/c? No  Grab bars in the bathroom? No  Shower chair or bench in shower? No  Elevated toilet seat or a handicapped toilet? No    Cognitive Function:   Normal cognitive status assessed by direct observation by this Nurse Health Advisor. No abnormalities found.        Immunizations Immunization History  Administered Date(s) Administered  . PFIZER(Purple Top)SARS-COV-2 Vaccination 09/03/2019, 10/03/2019  . Pneumococcal Conjugate-13 01/31/2020  . Tdap 05/14/2012    TDAP status: Up to date  Flu Vaccine status: Declined, Education has been provided regarding the importance of this vaccine but patient still declined. Advised may receive this vaccine at local pharmacy or Health Dept. Aware to provide a copy of the vaccination record if obtained from local pharmacy or Health Dept. Verbalized acceptance and understanding.  Pneumococcal vaccine status: Up to date  Covid-19 vaccine status: Completed vaccines  Qualifies for Shingles Vaccine? Yes   Zostavax completed No   Shingrix Completed?: No.    Education has been provided regarding the importance of this vaccine. Patient has been advised to call insurance company to determine out of pocket expense if they have not yet received this vaccine. Advised may also receive vaccine at  local pharmacy or Health Dept. Verbalized acceptance and understanding.  Screening Tests Health Maintenance  Topic Date Due  . INFLUENZA VACCINE  Never done  . COVID-19 Vaccine (3 - Booster for Pfizer series) 04/03/2020  . PNA vac Low Risk Adult (2 of 2 - PPSV23) 01/30/2021  . TETANUS/TDAP  05/14/2022  . COLONOSCOPY (Pts 45-18yrs Insurance coverage will need to be confirmed)  07/12/2027  . Hepatitis C Screening  Completed  . HPV VACCINES  Aged Out    Health Maintenance  Health Maintenance Due  Topic Date Due  . INFLUENZA VACCINE  Never done  . COVID-19 Vaccine (3 - Booster for Pfizer series) 04/03/2020    Colorectal cancer screening: Type of screening: Colonoscopy. Completed 07/11/2020. Repeat every 7 years  Lung Cancer Screening: (Low Dose CT Chest recommended if Age 76-80 years, 30 pack-year currently smoking OR have quit w/in 15years.) does not qualify.   Lung Cancer Screening Referral: N/A  Additional Screening:  Hepatitis C Screening: does qualify; Completed 01/31/2020  Vision Screening: Recommended annual ophthalmology exams for early detection of glaucoma and other disorders of the eye. Is the patient up to date with their annual eye exam?  No  Who is the provider or what is the name of the office in which the patient attends annual eye exams? Does not have a current eye doctor  If pt is not established with a provider, would they like to be referred to a provider to establish care? Yes .   Dental Screening: Recommended annual dental exams for proper oral hygiene  Community Resource Referral / Chronic Care Management: CRR required this visit?  No   CCM required this visit?  No      Plan:     I have personally reviewed and noted the following in the patient's chart:   . Medical and social history . Use of alcohol, tobacco or illicit drugs  . Current medications and supplements . Functional ability and status . Nutritional status . Physical  activity . Advanced directives . List of other physicians . Hospitalizations, surgeries, and ER visits in previous 12 months . Vitals . Screenings to include cognitive, depression, and falls . Referrals and appointments  In addition, I have reviewed and discussed with patient certain preventive protocols, quality metrics, and best practice recommendations. A written personalized care plan for preventive services as well as general preventive health recommendations were provided to patient.     Ofilia Neas, LPN   02/12/5630   Nurse Notes: None

## 2020-09-12 ENCOUNTER — Ambulatory Visit (INDEPENDENT_AMBULATORY_CARE_PROVIDER_SITE_OTHER): Payer: Medicare Other

## 2020-09-12 DIAGNOSIS — Z0000 Encounter for general adult medical examination without abnormal findings: Secondary | ICD-10-CM | POA: Diagnosis not present

## 2020-09-12 DIAGNOSIS — Z0100 Encounter for examination of eyes and vision without abnormal findings: Secondary | ICD-10-CM | POA: Diagnosis not present

## 2020-09-12 NOTE — Patient Instructions (Signed)
Frank [name] , Thank you for taking time to come for your Medicare Wellness Visit. I appreciate your ongoing commitment to your health goals. Please review the following plan we discussed and let me know if I can assist you in the future.   Screening recommendations/referrals: Colonoscopy: Up to date, next due 07/12/2027 Recommended yearly ophthalmology/optometry visit for glaucoma screening and checkup Recommended yearly dental visit for hygiene and checkup  Vaccinations: Influenza vaccine: Patient declined  Pneumococcal vaccine: Up to date, next due 01/30/2021 Tdap vaccine: Up to date, next due 05/14/2022 Shingles vaccine: Currently due for Shingrix, if you wish to receive we recommend that you do so at your local pharmacy as it is less expensive     Advanced directives: Advance directive discussed with you today. Even though you declined this today please call our office should you change your mind and we can give you the proper paperwork for you to fill out.   Conditions/risks identified: None   Next appointment: 10/09/2020 @ 9:00 am with Pharmacist at [facility] 68 Years and Older, Male Preventive care refers to lifestyle choices and visits with your health care provider that can promote health and wellness. What does preventive care include?  A yearly physical exam. This is also called an annual well check.  Dental exams once or twice a year.  Routine eye exams. Ask your health care provider how often you should have your eyes checked.  Personal lifestyle choices, including:  Daily care of your teeth and gums.  Regular physical activity.  Eating a healthy diet.  Avoiding tobacco and drug use.  Limiting alcohol use.  Practicing safe sex.  Taking low doses of aspirin every day.  Taking vitamin and mineral supplements as recommended by your health care provider. What happens during an annual well check? The services and screenings done by  your health care provider during your annual well check will depend on your age, overall health, lifestyle risk factors, and family history of disease. Counseling  Your health care provider may ask you questions about your:  Alcohol use.  Tobacco use.  Drug use.  Emotional well-being.  Home and relationship well-being.  Sexual activity.  Eating habits.  History of falls.  Memory and ability to understand (cognition).  Work and work Statistician. Screening  You may have the following tests or measurements:  Height, weight, and BMI.  Blood pressure.  Lipid and cholesterol levels. These may be checked every 5 years, or more frequently if you are over 38 years old.  Skin check.  Lung cancer screening. You may have this screening every year starting at age 45 if you have a 30-pack-year history of smoking and currently smoke or have quit within the past 15 years.  Fecal occult blood test (FOBT) of the stool. You may have this test every year starting at age 4.  Flexible sigmoidoscopy or colonoscopy. You may have a sigmoidoscopy every 5 years or a colonoscopy every 10 years starting at age 78.  Prostate cancer screening. Recommendations will vary depending on your family history and other risks.  Hepatitis C blood test.  Hepatitis B blood test.  Sexually transmitted disease (STD) testing.  Diabetes screening. This is done by checking your blood sugar (glucose) after you have not eaten for a while (fasting). You may have this done every 1-3 years.  Abdominal aortic aneurysm (AAA) screening. You may need this if you are a current or former smoker.  Osteoporosis. You may be screened starting  at age 43 if you are at high risk. Talk with your health care provider about your test results, treatment options, and if necessary, the need for more tests. Vaccines  Your health care provider may recommend certain vaccines, such as:  Influenza vaccine. This is recommended every  year.  Tetanus, diphtheria, and acellular pertussis (Tdap, Td) vaccine. You may need a Td booster every 10 years.  Zoster vaccine. You may need this after age 51.  Pneumococcal 13-valent conjugate (PCV13) vaccine. One dose is recommended after age 42.  Pneumococcal polysaccharide (PPSV23) vaccine. One dose is recommended after age 45. Talk to your health care provider about which screenings and vaccines you need and how often you need them. This information is not intended to replace advice given to you by your health care provider. Make sure you discuss any questions you have with your health care provider. Document Released: 07/11/2015 Document Revised: 03/03/2016 Document Reviewed: 04/15/2015 Elsevier Interactive Patient Education  2017 [place] Prevention in the Home Falls can cause injuries. They can happen to people of all ages. There are many things you can do to make your home safe and to help prevent falls. What can I do on the outside of my home?  Regularly fix the edges of walkways and driveways and fix any cracks.  Remove anything that might make you trip as you walk through a door, such as a raised step or threshold.  Trim any bushes or trees on the path to your home.  Use bright outdoor lighting.  Clear any walking paths of anything that might make someone trip, such as rocks or tools.  Regularly check to see if handrails are loose or broken. Make sure that both sides of any steps have handrails.  Any raised decks and porches should have guardrails on the edges.  Have any leaves, snow, or ice cleared regularly.  Use sand or salt on walking paths during winter.  Clean up any spills in your garage right away. This includes oil or grease spills. What can I do in the bathroom?  Use night lights.  Install grab bars by the toilet and in the tub and shower. Do not use towel bars as grab bars.  Use non-skid mats or decals in the tub or shower.  If you  need to sit down in the shower, use a plastic, non-slip stool.  Keep the floor dry. Clean up any water that spills on the floor as soon as it happens.  Remove soap buildup in the tub or shower regularly.  Attach bath mats securely with double-sided non-slip rug tape.  Do not have throw rugs and other things on the floor that can make you trip. What can I do in the bedroom?  Use night lights.  Make sure that you have a light by your bed that is easy to reach.  Do not use any sheets or blankets that are too big for your bed. They should not hang down onto the floor.  Have a firm chair that has side arms. You can use this for support while you get dressed.  Do not have throw rugs and other things on the floor that can make you trip. What can I do in the kitchen?  Clean up any spills right away.  Avoid walking on wet floors.  Keep items that you use a lot in easy-to-reach places.  If you need to reach something above you, use a strong step stool that has a grab bar.  Keep electrical cords out of the way.  Do not use floor polish or wax that makes floors slippery. If you must use wax, use non-skid floor wax.  Do not have throw rugs and other things on the floor that can make you trip. What can I do with my stairs?  Do not leave any items on the stairs.  Make sure that there are handrails on both sides of the stairs and use them. Fix handrails that are broken or loose. Make sure that handrails are as long as the stairways.  Check any carpeting to make sure that it is firmly attached to the stairs. Fix any carpet that is loose or worn.  Avoid having throw rugs at the top or bottom of the stairs. If you do have throw rugs, attach them to the floor with carpet tape.  Make sure that you have a light switch at the top of the stairs and the bottom of the stairs. If you do not have them, ask someone to add them for you. What else can I do to help prevent falls?  Wear shoes  that:  Do not have high heels.  Have rubber bottoms.  Are comfortable and fit you well.  Are closed at the toe. Do not wear sandals.  If you use a stepladder:  Make sure that it is fully opened. Do not climb a closed stepladder.  Make sure that both sides of the stepladder are locked into place.  Ask someone to hold it for you, if possible.  Clearly mark and make sure that you can see:  Any grab bars or handrails.  First and last steps.  Where the edge of each step is.  Use tools that help you move around (mobility aids) if they are needed. These include:  Canes.  Walkers.  Scooters.  Crutches.  Turn on the lights when you go into a dark area. Replace any light bulbs as soon as they burn out.  Set up your furniture so you have a clear path. Avoid moving your furniture around.  If any of your floors are uneven, fix them.  If there are any pets around you, be aware of where they are.  Review your medicines with your doctor. Some medicines can make you feel dizzy. This can increase your chance of falling. Ask your doctor what other things that you can do to help prevent falls. This information is not intended to replace advice given to you by your health care provider. Make sure you discuss any questions you have with your health care provider. Document Released: 04/10/2009 Document Revised: 11/20/2015 Document Reviewed: 07/19/2014 Elsevier Interactive Patient Education  2017 [employer].

## 2020-10-06 ENCOUNTER — Telehealth: Payer: Self-pay | Admitting: Pharmacist

## 2020-10-06 NOTE — Chronic Care Management (AMB) (Signed)
    Chronic Care Management Pharmacy Assistant   Name: DAYVION SANS  MRN: [id number] DOB: 1950/08/11  Reason for Encounter: Initial Questions for Pharmacist visit on 10-09-2020  04.11.2022:I left a patient a message to please return my call.  04.12.2022:I spoke with the patient, and he does not feel that the appointment is necessary. He stated that he just received a Medicare Wellness, and he knows his medications well. I explained to him the difference and the benefits of chronic care management. He did not feel that it was necessary. He would like to be disenrolled   Medications: Outpatient Encounter Medications as of 10/06/2020  Medication Sig  . amLODipine (NORVASC) 10 MG tablet Take 1 tablet (10 mg total) by mouth daily.  Marland Kitchen atorvastatin (LIPITOR) 40 MG tablet Take 1 tablet by mouth daily. (Patient taking differently: Take 40 mg by mouth daily.)  . fluticasone (FLONASE) 50 MCG/ACT nasal spray Place 1-2 sprays into both nostrils daily as needed for allergies or rhinitis.  Marland Kitchen ibuprofen (ADVIL) 200 MG tablet Take 200 mg by mouth every 6 (six) hours as needed for fever or mild pain.  Marland Kitchen lisinopril (ZESTRIL) 40 MG tablet TAKE 1 TABLET BY MOUTH EVERY DAY (Patient taking differently: Take 40 mg by mouth daily.)  . meloxicam (MOBIC) 7.5 MG tablet TAKE 1 TABLET(7.5 MG) BY MOUTH DAILY (Patient taking differently: Take 7.5 mg by mouth daily.)  . QUEtiapine (SEROQUEL) 50 MG tablet TAKE 1 TABLET BY MOUTH AT BEDTIME (Patient taking differently: Take 50 mg by mouth at bedtime.)   No facility-administered encounter medications on file as of 10/06/2020.   Star Rating Drugs:  Dispensed Quantity Pharmacy  Lisinopril 01.23.2022 [address] [place], [place] [phone]

## 2020-10-09 ENCOUNTER — Telehealth: Payer: Medicare Other
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
## 2020-10-13 LAB — PSA: PSA: 7.32

## 2020-10-14 ENCOUNTER — Other Ambulatory Visit: Payer: Self-pay | Admitting: Adult Health

## 2020-10-14 DIAGNOSIS — F5101 Primary insomnia: Secondary | ICD-10-CM

## 2020-10-21 ENCOUNTER — Other Ambulatory Visit: Payer: Self-pay | Admitting: Adult Health

## 2020-10-21 DIAGNOSIS — F5101 Primary insomnia: Secondary | ICD-10-CM

## 2020-10-21 NOTE — Telephone Encounter (Signed)
Last office visit- 07/02/2020 Last refill- 05/08/2020-90 tabs 1 refill  No future appointment scheduled.

## 2020-12-23 ENCOUNTER — Other Ambulatory Visit: Payer: Self-pay

## 2020-12-23 ENCOUNTER — Ambulatory Visit
Admission: RE | Admit: 2020-12-23 | Discharge: 2020-12-23 | Disposition: A | Discharge status: Home / Self Care | Payer: Medicare Other | Source: Ambulatory Visit | Attending: Radiation Oncology | Admitting: Radiation Oncology

## 2020-12-23 ENCOUNTER — Encounter: Payer: Self-pay | Admitting: Radiation Oncology

## 2020-12-23 VITALS — BP 123/75 | HR 92 | Temp 97.6°F | Resp 18 | Ht 68.0 in | Wt 205.8 lb

## 2020-12-23 DIAGNOSIS — C61 Malignant neoplasm of prostate: Secondary | ICD-10-CM | POA: Insufficient documentation

## 2020-12-23 NOTE — Progress Notes (Addendum)
GU Location of Tumor / Histology:  Adenocarcinoma of the Prostate  If Prostate Cancer, Gleason Score is (3 + 4), PSA is (7.3 as of 10/17/2020), and Prostate Volume (~45.45g)  Frank [name] presented with signs/symptoms of: upward trending PSA levels  Biopsies revealed:  12/01/2020   Past/Anticipated interventions by urology, if any:  12/01/2020 Dr. Louis Meckel Transrectal ultrasound of the prostate with biopsies  Past/Anticipated interventions by medical oncology, if any:  No referral placed at this time  Weight changes, if any:   IPSS Score: 6 SHIM Score:19  Bowel/Bladder complaints, if any:  None  Nausea/Vomiting, if any:  NOne  Pain issues, if any:  Chronic back pain  SAFETY ISSUES: Prior radiation? No Pacemaker/ICD?  No Possible current pregnancy? N/A Is the patient on methotrexate?  no  Current Complaints / other details:   Vitals:   12/23/20 0945  BP: 123/75  Pulse: 92  Resp: 18  Temp: 97.6 F (36.4 C)  SpO2: 98%  Weight: 93.4 kg  Height: 5\' 8"  (1.727 m)

## 2020-12-23 NOTE — Progress Notes (Addendum)
Radiation Oncology         [phone] ________________________________  Initial Outpatient Consultation  Name: Frank [name] MRN: [id number]  Date: 12/23/2020  DOB: [birth year]  IR:CVELFYBO, Tommi Rumps, NP  Ardis Hughs, MD   REFERRING PHYSICIAN: Ardis Hughs, MD  DIAGNOSIS: 70 y.o. gentleman with Stage T1c adenocarcinoma of the prostate with Gleason score of 3+4, and PSA of 7.32.    ICD-10-CM   1. Malignant neoplasm of prostate ([place])  C61       HISTORY OF PRESENT ILLNESS: Frank [name] is a 70 y.o. male with a diagnosis of prostate cancer. He was noted to have an elevated PSA of 4.5 in 01/2020 by his primary care provider, Dorothyann Peng, NP.   The PSA remained elevated at 4.2 when repeated in 02/2020. Accordingly, he was referred for evaluation in urology by Dr. Louis Meckel on 04/14/20,  digital rectal examination was performed at that time revealing no nodules.  He opted to monitor his PSA and a repeat PSA on 07/17/20 showed further elevation to 5.41.  The recommendation was to proceed with prostate MRI +/- prostate biopsy but the patient declined and instead preferred to continue to monitor the PSA.  This was noted further elevated at 7.32 in 09/2020.  Therefore, the patient proceeded to transrectal ultrasound with 12 biopsies of the prostate on 12/01/20.  The prostate volume measured 45.45 cc.  Out of 12 core biopsies, 6 were positive, involving all right-sided cores.  The maximum Gleason score was 3+4, and this was seen in the right mid lateral, right base lateral, right apex (small focus), and right base (with perineural invasion). Additionally, Gleason 3+3 was in the right mid and right apex lateral (with PNI).  The patient reviewed the biopsy results with his urologist and he has kindly been referred today for discussion of potential radiation treatment options.  He was adamantly not interested in prostatectomy.   PREVIOUS RADIATION THERAPY: No  PAST MEDICAL HISTORY:  Past Medical  History:  Diagnosis Date   Anxiety    Depression    Drug addiction ([place])    ETOH abuse    High cholesterol    Hypertensive urgency    Tinnitus       PAST SURGICAL HISTORY: Past Surgical History:  Procedure Laterality Date   SKIN CANCER EXCISION  2015   TONSILLECTOMY     WISDOM TOOTH EXTRACTION     nausea post op    FAMILY HISTORY:  Family History  Problem Relation Age of Onset   Breast cancer Mother        Died at 54   Lung cancer Father    Prostate cancer Father        Died at 35    Colon cancer Paternal Grandmother    Esophageal cancer Neg Hx    Rectal cancer Neg Hx    Stomach cancer Neg Hx    Colon polyps Neg Hx     SOCIAL HISTORY:  Social History   Socioeconomic History   Marital status: Divorced    Spouse name: Not on file   Number of children: Not on file   Years of education: Not on file   Highest education level: Not on file  Occupational History   Not on file  Tobacco Use   Smoking status: Never   Smokeless tobacco: Never  Vaping Use   Vaping Use: Never used  Substance and Sexual Activity   Alcohol use: Yes    Alcohol/week: 10.0 standard drinks  Types: 10 Glasses of wine per week   Drug use: Yes    Types: Other-see comments, "Crack" cocaine, Cocaine, Marijuana    Comment: current marijuana use  twice a month    Sexual activity: Not on file    Comment: oxycontin  Other Topics Concern   Not on file  Social History Narrative   Works as a Education officer, 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    Social Determinants of Health   Financial Resource Strain: Low Risk    Difficulty of Paying Living Expenses: Not hard at all  Food Insecurity: No Food Insecurity   Worried About Charity fundraiser in the Last Year: Never true   Arboriculturist in the Last Year: Never true  Transportation Needs: No Transportation Needs   Lack of Transportation (Medical): No   Lack of Transportation (Non-Medical): No  Physical  Activity: Insufficiently Active   Days of Exercise per Week: 3 days   Minutes of Exercise per Session: 30 min  Stress: No Stress Concern Present   Feeling of Stress : Not at all  Social Connections: Socially Isolated   Frequency of Communication with Friends and Family: More than three times a week   Frequency of Social Gatherings with Friends and Family: More than three times a week   Attends Religious Services: Never   Marine scientist or Organizations: No   Attends Music therapist: Never   Marital Status: Divorced  Human resources officer Violence: Not At Risk   Fear of Current or Ex-Partner: No   Emotionally Abused: No   Physically Abused: No   Sexually Abused: No    ALLERGIES: Patient has no known allergies.  MEDICATIONS:  Current Outpatient Medications  Medication Sig Dispense Refill   amLODipine (NORVASC) 10 MG tablet Take 1 tablet (10 mg total) by mouth daily. 90 tablet 1   atorvastatin (LIPITOR) 40 MG tablet Take 1 tablet by mouth daily. (Patient taking differently: Take 40 mg by mouth daily.) 90 tablet 3   ibuprofen (ADVIL) 200 MG tablet Take 200 mg by mouth every 6 (six) hours as needed for fever or mild pain.     lisinopril (ZESTRIL) 40 MG tablet TAKE 1 TABLET BY MOUTH EVERY DAY (Patient taking differently: Take 40 mg by mouth daily.) 90 tablet 1   meloxicam (MOBIC) 7.5 MG tablet TAKE 1 TABLET(7.5 MG) BY MOUTH DAILY (Patient taking differently: Take 7.5 mg by mouth daily.) 90 tablet 0   QUEtiapine (SEROQUEL) 50 MG tablet Take 1 tablet (50 mg total) by mouth at bedtime. 90 tablet 1   sildenafil (VIAGRA) 50 MG tablet Take 50 mg by mouth as needed for erectile dysfunction.     fluticasone (FLONASE) 50 MCG/ACT nasal spray Place 1-2 sprays into both nostrils daily as needed for allergies or rhinitis. (Patient not taking: Reported on 12/23/2020)     No current facility-administered medications for this encounter.    REVIEW OF SYSTEMS:  On review of systems, the  patient reports that he is doing well overall. He denies any chest pain, shortness of breath, cough, fevers, chills, night sweats, unintended weight changes. He denies any bowel disturbances, and denies abdominal pain, nausea or vomiting. He denies any new musculoskeletal or joint aches or pains. His IPSS was 6, indicating mild urinary symptoms. His SHIM was 19, indicating he has mild erectile dysfunction. A complete review of systems is obtained and is otherwise negative.  PHYSICAL EXAM:  Wt Readings from Last 3 Encounters:  12/23/20 205 lb 12.8 oz (93.4 kg)  08/20/20 202 lb 13.2 oz (92 kg)  07/11/20 204 lb (92.5 kg)   Temp Readings from Last 3 Encounters:  12/23/20 97.6 F (36.4 C)  08/20/20 98 F (36.7 C)  07/11/20 (!) 96.9 F (36.1 C) (Skin)   BP Readings from Last 3 Encounters:  12/23/20 123/75  08/20/20 (!) 145/93  07/11/20 128/82   Pulse Readings from Last 3 Encounters:  12/23/20 92  08/20/20 81  07/11/20 64   Pain Assessment Pain Score: 0-No pain/10  In general this is a well appearing Caucasian male in no acute distress. He's alert and oriented x4 and appropriate throughout the examination. Cardiopulmonary assessment is negative for acute distress, and he exhibits normal effort.     KPS = 100  100 - Normal; no complaints; no evidence of disease. 90   - Able to carry on normal activity; minor signs or symptoms of disease. 80   - Normal activity with effort; some signs or symptoms of disease. 61   - Cares for self; unable to carry on normal activity or to do active work. 60   - Requires occasional assistance, but is able to care for most of his personal needs. 50   - Requires considerable assistance and frequent medical care. 32   - Disabled; requires special care and assistance. 38   - Severely disabled; hospital admission is indicated although death not imminent. 20   - Very sick; hospital admission necessary; active supportive treatment necessary. 10   -  Moribund; fatal processes progressing rapidly. 0     - Dead  Karnofsky DA, Abelmann [place], Craver LS and Burchenal [facility] [phone]) The use of the nitrogen mustards in the palliative treatment of carcinoma: with particular reference to bronchogenic carcinoma Cancer 1 634-56  LABORATORY DATA:  Lab Results  Component Value Date   WBC 7.2 08/20/2020   HGB 15.8 08/20/2020   HCT 46.0 08/20/2020   MCV 84.6 08/20/2020   PLT 220 08/20/2020   Lab Results  Component Value Date   NA 135 08/20/2020   K 4.1 08/20/2020   CL 103 08/20/2020   CO2 22 08/20/2020   Lab Results  Component Value Date   ALT 33 01/31/2020   AST 22 01/31/2020   ALKPHOS 68 10/17/2018   BILITOT 0.5 01/31/2020     RADIOGRAPHY: No results found.    IMPRESSION/PLAN: 1. 70 y.o. gentleman with Stage T1c adenocarcinoma of the prostate with Gleason Score of 3+4, and PSA of 7.32. We discussed the patient's workup and outlined the nature of prostate cancer in this setting. The patient's T stage, Gleason's score, and PSA put him into the favorable intermediate risk group. Accordingly, he is eligible for a variety of potential treatment options including brachytherapy, 5.5 weeks of external radiation, or prostatectomy. We discussed the available radiation techniques, and focused on the details and logistics of delivery. We discussed and outlined the risks, benefits, short and long-term effects associated with radiotherapy and compared and contrasted these with prostatectomy. We discussed the role of SpaceOAR gel in reducing the rectal toxicity associated with radiotherapy. He appears to have a good understanding of his disease and our treatment recommendations which are of curative intent.  He was encouraged to ask questions that were answered to his stated satisfaction.  At the conclusion of our conversation, the patient is interested in moving forward with 5.5 weeks of EBRT. He would like to take  additional time to look at where he can fit  this in his calendar after a July trip and has our contact information to let us know once he has time preferences We will share our discussion with Dr. Louis Meckel and look forward to hearing from the patient in the near future and continuing to participate in his care.  We personally spent 75 minutes in this encounter including chart review, reviewing radiological studies, meeting face-to-face with the patient, entering orders and completing documentation.    Nicholos Johns, PA-C    Tyler Pita, MD  [facility] Direct Dial: [phone]  Fax: [phone] [email or web address]  Skype  [email or web address]   This document serves as a record of services personally performed by Tyler Pita, MD and Freeman Caldron, PA-C. It was created on their behalf by Wilburn Mylar, a trained medical scribe. The creation of this record is based on the scribe's personal observations and the provider's statements to them. This document has been checked and approved by the attending provider.

## 2020-12-25 NOTE — Addendum Note (Signed)
Encounter addended by: Tyler Pita, MD on: 12/25/2020 9:49 AM  Actions taken: Clinical Note Signed

## 2020-12-25 NOTE — Addendum Note (Signed)
Encounter addended by: Tyler Pita, MD on: 12/25/2020 9:57 AM  Actions taken: Clinical Note Signed

## 2020-12-27 ENCOUNTER — Other Ambulatory Visit: Payer: Self-pay | Admitting: Adult Health

## 2020-12-27 DIAGNOSIS — E782 Mixed hyperlipidemia: Secondary | ICD-10-CM

## 2021-01-07 ENCOUNTER — Encounter: Payer: Self-pay | Admitting: Adult Health

## 2021-01-20 ENCOUNTER — Other Ambulatory Visit: Payer: Self-pay | Admitting: Adult Health

## 2021-01-20 DIAGNOSIS — F5101 Primary insomnia: Secondary | ICD-10-CM

## 2021-02-05 ENCOUNTER — Encounter: Payer: Self-pay | Admitting: Urology

## 2021-02-05 NOTE — Progress Notes (Signed)
Patient is scheduled for fiducial markers and SpaceOAR gel placement on 02/19/2021 and CT simulation on 02/24/2021, in preparation to begin his daily radiotherapy in the near future.  Nicholos Johns, MMS, PA-C [place] [facility] [place]: [phone]  Fax: [phone]

## 2021-02-12 ENCOUNTER — Other Ambulatory Visit: Payer: Self-pay | Admitting: Adult Health

## 2021-02-12 ENCOUNTER — Encounter: Payer: Self-pay | Admitting: Radiation Oncology

## 2021-02-12 DIAGNOSIS — I10 Essential (primary) hypertension: Secondary | ICD-10-CM

## 2021-02-12 NOTE — Progress Notes (Signed)
This patient spoke with Dr. Louis Meckel today and changed his mind from IMRT to brachytherapy.

## 2021-02-13 ENCOUNTER — Encounter: Payer: Self-pay | Admitting: Urology

## 2021-02-13 NOTE — Progress Notes (Signed)
This patient has had another change of heart, and now wants to stay with external beam radiation, per [facility]. He is scheduled for fiducial markers and SpaceOAR gel 02/19/21 and CT [facility] 02/24/21.  Nicholos Johns, MMS, PA-C [facility]: [phone]  Fax: [phone]

## 2021-02-15 ENCOUNTER — Emergency Department (HOSPITAL_COMMUNITY)
Admission: EM | Admit: 2021-02-15 | Discharge: 2021-02-15 | Disposition: A | Discharge status: Home / Self Care | Payer: Medicare Other | Attending: Emergency Medicine | Admitting: Emergency Medicine

## 2021-02-15 ENCOUNTER — Emergency Department (HOSPITAL_COMMUNITY): Payer: Medicare Other

## 2021-02-15 ENCOUNTER — Other Ambulatory Visit: Payer: Self-pay

## 2021-02-15 ENCOUNTER — Encounter (HOSPITAL_COMMUNITY): Payer: Self-pay | Admitting: Oncology

## 2021-02-15 DIAGNOSIS — S62112A Displaced fracture of triquetrum [cuneiform] bone, left wrist, initial encounter for closed fracture: Secondary | ICD-10-CM | POA: Insufficient documentation

## 2021-02-15 DIAGNOSIS — M19042 Primary osteoarthritis, left hand: Secondary | ICD-10-CM | POA: Diagnosis not present

## 2021-02-15 DIAGNOSIS — I10 Essential (primary) hypertension: Secondary | ICD-10-CM | POA: Diagnosis not present

## 2021-02-15 DIAGNOSIS — S63502A Unspecified sprain of left wrist, initial encounter: Secondary | ICD-10-CM | POA: Diagnosis not present

## 2021-02-15 DIAGNOSIS — Z79899 Other long term (current) drug therapy: Secondary | ICD-10-CM | POA: Insufficient documentation

## 2021-02-15 DIAGNOSIS — S6392XA Sprain of unspecified part of left wrist and hand, initial encounter: Secondary | ICD-10-CM | POA: Diagnosis not present

## 2021-02-15 DIAGNOSIS — Z85828 Personal history of other malignant neoplasm of skin: Secondary | ICD-10-CM | POA: Diagnosis not present

## 2021-02-15 DIAGNOSIS — Y92410 Unspecified street and highway as the place of occurrence of the external cause: Secondary | ICD-10-CM | POA: Insufficient documentation

## 2021-02-15 DIAGNOSIS — S6992XA Unspecified injury of left wrist, hand and finger(s), initial encounter: Secondary | ICD-10-CM | POA: Diagnosis present

## 2021-02-15 DIAGNOSIS — Z041 Encounter for examination and observation following transport accident: Secondary | ICD-10-CM | POA: Diagnosis not present

## 2021-02-15 MED ORDER — IBUPROFEN 200 MG PO TABS
400.0000 mg | ORAL_TABLET | Freq: Once | ORAL | Status: DC
  Filled 2021-02-15: qty 2

## 2021-02-15 MED ORDER — ACETAMINOPHEN 500 MG PO TABS
1000.0000 mg | ORAL_TABLET | Freq: Once | ORAL | Status: AC
  Administered 2021-02-15: 1000 mg via ORAL
  Filled 2021-02-15: qty 2

## 2021-02-15 NOTE — Discharge Instructions (Addendum)
It was our pleasure to provide your ER care today - we hope that you feel better.  Wear splint for comfort/support. Elevate hand to help with pain and swelling. Ice/coldpack to sore area.   Follow up with orthopedist/hand doctor in the coming week - call office tomorrow AM to arrange appointment.   Your blood pressure is high tonight - continue blood pressure meds, limit salt intake, and follow up with primary care doctor in the next couple weeks.   Return to ER if worse, new symptoms, new/severe or intractable pain, or other concern.

## 2021-02-15 NOTE — ED Notes (Signed)
Pt. Refused to get into gown, "stating he's going to radiology and doesn't need to get into a gown."
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
## 2021-02-20 ENCOUNTER — Telehealth: Payer: Self-pay | Admitting: Pharmacist

## 2021-02-20 NOTE — Chronic Care Management (AMB) (Signed)
This encounter was created in error - please disregard.

## 2021-02-23 ENCOUNTER — Telehealth: Payer: Self-pay | Admitting: *Deleted

## 2021-02-23 NOTE — Telephone Encounter (Signed)
CALLED PATIENT TO REMIND OF HIS SIM APPT. FOR 02-24-21- ARRIVAL TIME- 9:15 AM @ [facility] , SPOKE WITH PATIENT AND HE IS AWARE OF THIS APPT.

## 2021-02-24 ENCOUNTER — Other Ambulatory Visit: Payer: Self-pay

## 2021-02-24 ENCOUNTER — Ambulatory Visit
Admission: RE | Admit: 2021-02-24 | Discharge: 2021-02-24 | Disposition: A | Discharge status: Home / Self Care | Payer: Medicare Other | Source: Ambulatory Visit | Attending: Radiation Oncology | Admitting: Radiation Oncology

## 2021-02-24 DIAGNOSIS — M25532 Pain in left wrist: Secondary | ICD-10-CM | POA: Diagnosis not present

## 2021-02-24 DIAGNOSIS — C61 Malignant neoplasm of prostate: Secondary | ICD-10-CM | POA: Diagnosis present

## 2021-02-24 DIAGNOSIS — Z510 Encounter for antineoplastic radiation therapy: Secondary | ICD-10-CM | POA: Insufficient documentation

## 2021-02-24 DIAGNOSIS — M4692 Unspecified inflammatory spondylopathy, cervical region: Secondary | ICD-10-CM | POA: Diagnosis not present

## 2021-02-24 NOTE — Progress Notes (Signed)
  Radiation Oncology         [phone] ________________________________  Name: Frank [name] MRN: [id number]  Date: 02/24/2021  DOB: [birth year]  SIMULATION AND TREATMENT PLANNING NOTE    ICD-10-CM   1. Malignant neoplasm of prostate ([place])  C61       DIAGNOSIS:  70 y.o. gentleman with Stage T1c adenocarcinoma of the prostate with Gleason score of 3+4, and PSA of 7.32.  NARRATIVE:  The patient was brought to the [facility].  Identity was confirmed.  All relevant records and images related to the planned course of therapy were reviewed.  The patient freely provided informed written consent to proceed with treatment after reviewing the details related to the planned course of therapy. The consent form was witnessed and verified by the simulation staff.  Then, the patient was set-up in a stable reproducible supine position for radiation therapy.  A vacuum lock pillow device was custom fabricated to position his legs in a reproducible immobilized position.  Then, I performed a urethrogram under sterile conditions to identify the prostatic apex.  CT images were obtained.  Surface markings were placed.  The CT images were loaded into the planning software.  Then the prostate target and avoidance structures including the rectum, bladder, bowel and hips were contoured.  Treatment planning then occurred.  The radiation prescription was entered and confirmed.  A total of one complex treatment devices was fabricated. I have requested : Intensity Modulated Radiotherapy (IMRT) is medically necessary for this case for the following reason:  Rectal sparing.Marland Kitchen  PLAN:  The patient will receive 70 Gy in 28 fractions.  ________________________________  Sheral Apley Tammi Klippel, M.D.

## 2021-02-26 ENCOUNTER — Ambulatory Visit
Admission: RE | Admit: 2021-02-26 | Discharge: 2021-02-26 | Disposition: A | Discharge status: Home / Self Care | Payer: Medicare Other | Source: Ambulatory Visit | Attending: Radiation Oncology | Admitting: Radiation Oncology

## 2021-02-26 DIAGNOSIS — R300 Dysuria: Secondary | ICD-10-CM | POA: Insufficient documentation

## 2021-03-06 DIAGNOSIS — R300 Dysuria: Secondary | ICD-10-CM | POA: Diagnosis not present

## 2021-03-07 ENCOUNTER — Other Ambulatory Visit: Payer: Self-pay | Admitting: Adult Health

## 2021-03-07 DIAGNOSIS — I10 Essential (primary) hypertension: Secondary | ICD-10-CM

## 2021-03-09 ENCOUNTER — Other Ambulatory Visit: Payer: Self-pay

## 2021-03-09 DIAGNOSIS — R300 Dysuria: Secondary | ICD-10-CM | POA: Diagnosis not present

## 2021-03-10 ENCOUNTER — Ambulatory Visit
Admission: RE | Admit: 2021-03-10 | Discharge: 2021-03-10 | Disposition: A | Discharge status: Home / Self Care | Payer: Medicare Other | Source: Ambulatory Visit | Attending: Radiation Oncology | Admitting: Radiation Oncology

## 2021-03-10 DIAGNOSIS — R300 Dysuria: Secondary | ICD-10-CM | POA: Diagnosis not present

## 2021-03-10 DIAGNOSIS — M546 Pain in thoracic spine: Secondary | ICD-10-CM | POA: Diagnosis not present

## 2021-03-10 DIAGNOSIS — M542 Cervicalgia: Secondary | ICD-10-CM | POA: Diagnosis not present

## 2021-03-10 DIAGNOSIS — M25532 Pain in left wrist: Secondary | ICD-10-CM | POA: Diagnosis not present

## 2021-03-11 ENCOUNTER — Ambulatory Visit
Admission: RE | Admit: 2021-03-11 | Discharge: 2021-03-11 | Disposition: A | Discharge status: Home / Self Care | Payer: Medicare Other | Source: Ambulatory Visit | Attending: Radiation Oncology | Admitting: Radiation Oncology

## 2021-03-11 ENCOUNTER — Other Ambulatory Visit: Payer: Self-pay

## 2021-03-11 DIAGNOSIS — R300 Dysuria: Secondary | ICD-10-CM | POA: Diagnosis not present

## 2021-03-12 ENCOUNTER — Ambulatory Visit
Admission: RE | Admit: 2021-03-12 | Discharge: 2021-03-12 | Disposition: A | Discharge status: Home / Self Care | Payer: Medicare Other | Source: Ambulatory Visit | Attending: Radiation Oncology | Admitting: Radiation Oncology

## 2021-03-12 DIAGNOSIS — R300 Dysuria: Secondary | ICD-10-CM | POA: Diagnosis not present

## 2021-03-13 ENCOUNTER — Ambulatory Visit
Admission: RE | Admit: 2021-03-13 | Discharge: 2021-03-13 | Disposition: A | Discharge status: Home / Self Care | Payer: Medicare Other | Source: Ambulatory Visit | Attending: Radiation Oncology | Admitting: Radiation Oncology

## 2021-03-13 ENCOUNTER — Other Ambulatory Visit: Payer: Self-pay

## 2021-03-13 DIAGNOSIS — R300 Dysuria: Secondary | ICD-10-CM | POA: Diagnosis not present

## 2021-03-16 ENCOUNTER — Ambulatory Visit
Admission: RE | Admit: 2021-03-16 | Discharge: 2021-03-16 | Disposition: A | Discharge status: Home / Self Care | Payer: Medicare Other | Source: Ambulatory Visit | Attending: Radiation Oncology | Admitting: Radiation Oncology

## 2021-03-16 DIAGNOSIS — R300 Dysuria: Secondary | ICD-10-CM | POA: Diagnosis not present

## 2021-03-17 ENCOUNTER — Ambulatory Visit
Admission: RE | Admit: 2021-03-17 | Discharge: 2021-03-17 | Disposition: A | Discharge status: Home / Self Care | Payer: Medicare Other | Source: Ambulatory Visit | Attending: Radiation Oncology | Admitting: Radiation Oncology

## 2021-03-17 ENCOUNTER — Other Ambulatory Visit: Payer: Self-pay

## 2021-03-17 DIAGNOSIS — R300 Dysuria: Secondary | ICD-10-CM | POA: Diagnosis not present

## 2021-03-18 ENCOUNTER — Ambulatory Visit
Admission: RE | Admit: 2021-03-18 | Discharge: 2021-03-18 | Disposition: A | Discharge status: Home / Self Care | Payer: Medicare Other | Source: Ambulatory Visit | Attending: Radiation Oncology | Admitting: Radiation Oncology

## 2021-03-18 ENCOUNTER — Other Ambulatory Visit: Payer: Self-pay | Admitting: Radiation Oncology

## 2021-03-18 DIAGNOSIS — R300 Dysuria: Secondary | ICD-10-CM | POA: Diagnosis not present

## 2021-03-18 MED ORDER — TAMSULOSIN HCL 0.4 MG PO CAPS: 0.4000 mg | ORAL_CAPSULE | Freq: Every day | ORAL | 5 refills | Status: DC

## 2021-03-19 ENCOUNTER — Ambulatory Visit
Admission: RE | Admit: 2021-03-19 | Discharge: 2021-03-19 | Disposition: A | Discharge status: Home / Self Care | Payer: Medicare Other | Source: Ambulatory Visit | Attending: Radiation Oncology | Admitting: Radiation Oncology

## 2021-03-19 ENCOUNTER — Other Ambulatory Visit: Payer: Self-pay

## 2021-03-19 DIAGNOSIS — R300 Dysuria: Secondary | ICD-10-CM | POA: Diagnosis not present

## 2021-03-20 ENCOUNTER — Ambulatory Visit
Admission: RE | Admit: 2021-03-20 | Discharge: 2021-03-20 | Disposition: A | Discharge status: Home / Self Care | Payer: Medicare Other | Source: Ambulatory Visit | Attending: Radiation Oncology | Admitting: Radiation Oncology

## 2021-03-20 DIAGNOSIS — R300 Dysuria: Secondary | ICD-10-CM | POA: Diagnosis not present

## 2021-03-23 ENCOUNTER — Ambulatory Visit
Admission: RE | Admit: 2021-03-23 | Discharge: 2021-03-23 | Disposition: A | Discharge status: Home / Self Care | Payer: Medicare Other | Source: Ambulatory Visit | Attending: Radiation Oncology | Admitting: Radiation Oncology

## 2021-03-23 DIAGNOSIS — R300 Dysuria: Secondary | ICD-10-CM | POA: Diagnosis not present

## 2021-03-24 ENCOUNTER — Ambulatory Visit
Admission: RE | Admit: 2021-03-24 | Discharge: 2021-03-24 | Disposition: A | Discharge status: Home / Self Care | Payer: Medicare Other | Source: Ambulatory Visit | Attending: Radiation Oncology | Admitting: Radiation Oncology

## 2021-03-24 DIAGNOSIS — R300 Dysuria: Secondary | ICD-10-CM | POA: Diagnosis not present

## 2021-03-25 ENCOUNTER — Ambulatory Visit
Admission: RE | Admit: 2021-03-25 | Discharge: 2021-03-25 | Disposition: A | Discharge status: Home / Self Care | Payer: Medicare Other | Source: Ambulatory Visit | Attending: Radiation Oncology | Admitting: Radiation Oncology

## 2021-03-25 DIAGNOSIS — R300 Dysuria: Secondary | ICD-10-CM | POA: Diagnosis not present

## 2021-03-26 ENCOUNTER — Other Ambulatory Visit: Payer: Self-pay

## 2021-03-26 ENCOUNTER — Ambulatory Visit
Admission: RE | Admit: 2021-03-26 | Discharge: 2021-03-26 | Disposition: A | Discharge status: Home / Self Care | Payer: Medicare Other | Source: Ambulatory Visit | Attending: Radiation Oncology | Admitting: Radiation Oncology

## 2021-03-26 DIAGNOSIS — R300 Dysuria: Secondary | ICD-10-CM | POA: Diagnosis not present

## 2021-03-27 ENCOUNTER — Ambulatory Visit
Admission: RE | Admit: 2021-03-27 | Discharge: 2021-03-27 | Disposition: A | Discharge status: Home / Self Care | Payer: Medicare Other | Source: Ambulatory Visit | Attending: Radiation Oncology | Admitting: Radiation Oncology

## 2021-03-27 ENCOUNTER — Other Ambulatory Visit: Payer: Self-pay

## 2021-03-27 ENCOUNTER — Ambulatory Visit: Payer: Medicare Other

## 2021-03-27 DIAGNOSIS — R300 Dysuria: Secondary | ICD-10-CM

## 2021-03-27 LAB — URINALYSIS, COMPLETE (UACMP) WITH MICROSCOPIC
Bilirubin Urine: NEGATIVE
Glucose, UA: NEGATIVE mg/dL
Hgb urine dipstick: NEGATIVE
Ketones, ur: NEGATIVE mg/dL
Leukocytes,Ua: NEGATIVE
Nitrite: NEGATIVE
Protein, ur: NEGATIVE mg/dL
Specific Gravity, Urine: 1.024 (ref 1.005–1.030)
pH: 5 (ref 5.0–8.0)

## 2021-03-27 MED ORDER — PHENAZOPYRIDINE HCL 200 MG PO TABS: 200.0000 mg | ORAL_TABLET | Freq: Three times a day (TID) | ORAL | 5 refills | Status: DC | PRN

## 2021-03-27 MED ORDER — CIPROFLOXACIN HCL 500 MG PO TABS: 500.0000 mg | ORAL_TABLET | Freq: Two times a day (BID) | ORAL | 0 refills | Status: DC

## 2021-03-29 LAB — URINE CULTURE: Culture: NO GROWTH

## 2021-03-30 ENCOUNTER — Ambulatory Visit
Admission: RE | Admit: 2021-03-30 | Discharge: 2021-03-30 | Disposition: A | Discharge status: Home / Self Care | Payer: Medicare Other | Source: Ambulatory Visit | Attending: Radiation Oncology | Admitting: Radiation Oncology

## 2021-03-30 ENCOUNTER — Telehealth: Payer: Self-pay

## 2021-03-30 DIAGNOSIS — R300 Dysuria: Secondary | ICD-10-CM | POA: Insufficient documentation

## 2021-03-30 NOTE — Telephone Encounter (Signed)
Spoke w/ patient, notified of lab results, and he's continuing to finish his Ciprofloxacin along w/ otc AZO and states "The medications are helping and he's doing well". Advised patient to call us back if he has any issues.

## 2021-03-31 ENCOUNTER — Other Ambulatory Visit: Payer: Self-pay

## 2021-03-31 ENCOUNTER — Ambulatory Visit
Admission: RE | Admit: 2021-03-31 | Discharge: 2021-03-31 | Disposition: A | Discharge status: Home / Self Care | Payer: Medicare Other | Source: Ambulatory Visit | Attending: Radiation Oncology | Admitting: Radiation Oncology

## 2021-03-31 DIAGNOSIS — R300 Dysuria: Secondary | ICD-10-CM | POA: Diagnosis not present

## 2021-04-01 ENCOUNTER — Ambulatory Visit
Admission: RE | Admit: 2021-04-01 | Discharge: 2021-04-01 | Disposition: A | Discharge status: Home / Self Care | Payer: Medicare Other | Source: Ambulatory Visit | Attending: Radiation Oncology | Admitting: Radiation Oncology

## 2021-04-01 DIAGNOSIS — R300 Dysuria: Secondary | ICD-10-CM | POA: Diagnosis not present

## 2021-04-02 ENCOUNTER — Other Ambulatory Visit: Payer: Self-pay

## 2021-04-02 ENCOUNTER — Ambulatory Visit
Admission: RE | Admit: 2021-04-02 | Discharge: 2021-04-02 | Disposition: A | Discharge status: Home / Self Care | Payer: Medicare Other | Source: Ambulatory Visit | Attending: Radiation Oncology | Admitting: Radiation Oncology

## 2021-04-02 DIAGNOSIS — R300 Dysuria: Secondary | ICD-10-CM | POA: Diagnosis not present

## 2021-04-03 ENCOUNTER — Ambulatory Visit
Admission: RE | Admit: 2021-04-03 | Discharge: 2021-04-03 | Disposition: A | Discharge status: Home / Self Care | Payer: Medicare Other | Source: Ambulatory Visit | Attending: Radiation Oncology | Admitting: Radiation Oncology

## 2021-04-03 DIAGNOSIS — R300 Dysuria: Secondary | ICD-10-CM | POA: Diagnosis not present

## 2021-04-06 ENCOUNTER — Ambulatory Visit
Admission: RE | Admit: 2021-04-06 | Discharge: 2021-04-06 | Disposition: A | Discharge status: Home / Self Care | Payer: Medicare Other | Source: Ambulatory Visit | Attending: Radiation Oncology | Admitting: Radiation Oncology

## 2021-04-06 ENCOUNTER — Other Ambulatory Visit: Payer: Self-pay

## 2021-04-06 DIAGNOSIS — R300 Dysuria: Secondary | ICD-10-CM | POA: Diagnosis not present

## 2021-04-07 ENCOUNTER — Ambulatory Visit
Admission: RE | Admit: 2021-04-07 | Discharge: 2021-04-07 | Disposition: A | Discharge status: Home / Self Care | Payer: Medicare Other | Source: Ambulatory Visit | Attending: Radiation Oncology | Admitting: Radiation Oncology

## 2021-04-07 DIAGNOSIS — M546 Pain in thoracic spine: Secondary | ICD-10-CM | POA: Diagnosis not present

## 2021-04-07 DIAGNOSIS — M542 Cervicalgia: Secondary | ICD-10-CM | POA: Diagnosis not present

## 2021-04-07 DIAGNOSIS — M25532 Pain in left wrist: Secondary | ICD-10-CM | POA: Diagnosis not present

## 2021-04-07 DIAGNOSIS — R300 Dysuria: Secondary | ICD-10-CM | POA: Diagnosis not present

## 2021-04-08 ENCOUNTER — Ambulatory Visit
Admission: RE | Admit: 2021-04-08 | Discharge: 2021-04-08 | Disposition: A | Discharge status: Home / Self Care | Payer: Medicare Other | Source: Ambulatory Visit | Attending: Radiation Oncology | Admitting: Radiation Oncology

## 2021-04-08 DIAGNOSIS — R300 Dysuria: Secondary | ICD-10-CM | POA: Diagnosis not present

## 2021-04-09 ENCOUNTER — Other Ambulatory Visit: Payer: Self-pay

## 2021-04-09 ENCOUNTER — Ambulatory Visit
Admission: RE | Admit: 2021-04-09 | Discharge: 2021-04-09 | Disposition: A | Discharge status: Home / Self Care | Payer: Medicare Other | Source: Ambulatory Visit | Attending: Radiation Oncology | Admitting: Radiation Oncology

## 2021-04-09 DIAGNOSIS — R300 Dysuria: Secondary | ICD-10-CM | POA: Diagnosis not present

## 2021-04-10 ENCOUNTER — Ambulatory Visit
Admission: RE | Admit: 2021-04-10 | Discharge: 2021-04-10 | Disposition: A | Discharge status: Home / Self Care | Payer: Medicare Other | Source: Ambulatory Visit | Attending: Radiation Oncology | Admitting: Radiation Oncology

## 2021-04-10 DIAGNOSIS — R300 Dysuria: Secondary | ICD-10-CM | POA: Diagnosis not present

## 2021-04-13 ENCOUNTER — Other Ambulatory Visit: Payer: Self-pay

## 2021-04-13 ENCOUNTER — Ambulatory Visit
Admission: RE | Admit: 2021-04-13 | Discharge: 2021-04-13 | Disposition: A | Discharge status: Home / Self Care | Payer: Medicare Other | Source: Ambulatory Visit | Attending: Radiation Oncology | Admitting: Radiation Oncology

## 2021-04-13 DIAGNOSIS — R300 Dysuria: Secondary | ICD-10-CM | POA: Diagnosis not present

## 2021-04-14 ENCOUNTER — Ambulatory Visit
Admission: RE | Admit: 2021-04-14 | Discharge: 2021-04-14 | Disposition: A | Discharge status: Home / Self Care | Payer: Medicare Other | Source: Ambulatory Visit | Attending: Radiation Oncology | Admitting: Radiation Oncology

## 2021-04-14 DIAGNOSIS — R300 Dysuria: Secondary | ICD-10-CM | POA: Diagnosis not present

## 2021-04-15 ENCOUNTER — Ambulatory Visit
Admission: RE | Admit: 2021-04-15 | Discharge: 2021-04-15 | Disposition: A | Discharge status: Home / Self Care | Payer: Medicare Other | Source: Ambulatory Visit | Attending: Radiation Oncology | Admitting: Radiation Oncology

## 2021-04-15 ENCOUNTER — Other Ambulatory Visit: Payer: Self-pay

## 2021-04-15 ENCOUNTER — Encounter: Payer: Self-pay | Admitting: Urology

## 2021-04-15 DIAGNOSIS — C61 Malignant neoplasm of prostate: Secondary | ICD-10-CM

## 2021-04-15 DIAGNOSIS — R300 Dysuria: Secondary | ICD-10-CM | POA: Diagnosis not present

## 2021-04-18 ENCOUNTER — Other Ambulatory Visit: Payer: Self-pay | Admitting: Adult Health

## 2021-04-18 DIAGNOSIS — F5101 Primary insomnia: Secondary | ICD-10-CM

## 2021-05-15 ENCOUNTER — Other Ambulatory Visit: Payer: Self-pay | Admitting: Urology

## 2021-05-15 ENCOUNTER — Telehealth: Payer: Self-pay

## 2021-05-15 MED ORDER — TAMSULOSIN HCL 0.4 MG PO CAPS: 0.8000 mg | ORAL_CAPSULE | Freq: Every day | ORAL | 1 refills | Status: DC

## 2021-05-15 NOTE — Telephone Encounter (Signed)
Returned patient call requesting a refill on medication.  Freeman Caldron, PA was notified and new order was called into his pharmacy of choice.  Nothing else follows.

## 2021-05-20 ENCOUNTER — Telehealth: Payer: Self-pay

## 2021-05-20 ENCOUNTER — Encounter: Payer: Self-pay | Admitting: Urology

## 2021-05-20 ENCOUNTER — Ambulatory Visit
Admission: RE | Admit: 2021-05-20 | Discharge: 2021-05-20 | Disposition: A | Discharge status: Home / Self Care | Payer: Medicare Other | Source: Ambulatory Visit | Attending: Urology | Admitting: Urology

## 2021-05-20 ENCOUNTER — Other Ambulatory Visit: Payer: Self-pay | Admitting: Urology

## 2021-05-20 DIAGNOSIS — C61 Malignant neoplasm of prostate: Secondary | ICD-10-CM

## 2021-05-20 NOTE — Progress Notes (Signed)
  Radiation Oncology         [phone] ________________________________  Name: ONEAL SCHOENBERGER MRN: [id number]  Date: 04/15/2021  DOB: 21-Apr-1951  End of Treatment Note  Diagnosis:   70 y.o. gentleman with Stage T1c adenocarcinoma of the prostate with Gleason score of 3+4, and PSA of 7.32.     Indication for treatment:  Curative, Definitive Radiotherapy       Radiation treatment dates:   03/09/21 - 04/15/21  Site/dose:   The prostate was treated to 70 Gy in 28 fractions of 2.5 Gy  Beams/energy:   The patient was treated with IMRT using volumetric arc therapy delivering 6 MV X-rays to clockwise and counterclockwise circumferential arcs with a 90 degree collimator offset to avoid dose scalloping.  Image guidance was performed with daily cone beam CT prior to each fraction to align to gold markers in the prostate and assure proper bladder and rectal fill volumes.  Immobilization was achieved with Body[employer] custom mold.  Narrative: The patient tolerated radiation treatment relatively well with only minor urinary irritation and modest fatigue.  He reported frequency, urgency, weak flow of stream and occasional dysuria which were all improved with the use of AZO and Flomax.  He also experienced some occasional constipation but denied abdominal pain, nausea, vomiting or diarrhea.  Plan: The patient has completed radiation treatment. He will return to radiation oncology clinic for routine followup in one month. I advised him to call or return sooner if he has any questions or concerns related to his recovery or treatment. ________________________________  Sheral Apley. Tammi Klippel, M.D.

## 2021-05-20 NOTE — Telephone Encounter (Signed)
Call patient x2. Left message in reference to patient's 9:30am-05/20/21 telephone appointment, w/ regard to completing the nursing portion. I left my extension [phone] for patient to return call before appointment time.

## 2021-05-20 NOTE — Progress Notes (Signed)
Patient reports unrelated back pain 7/10, otherwise doing well. No other symptoms reported at this time.  Meaningful use complete.  I-PSS Score of 3 (mild).  Currently on Flomax 0.4mg  and NO urology follow-up scheduled at this time, per patient.  Patient notified of 9:30am-05/20/21 telephone appointment and verbalized understanding.  Patient preferred contact # [phone].

## 2021-05-20 NOTE — Progress Notes (Signed)
Radiation Oncology         (336) [phone] ________________________________  Name: Frank [name] MRN: [id number]  Date: 05/20/2021  DOB: 05-15-1951  Post Treatment Note  CC: Dorothyann Peng, NP  Ardis Hughs, MD  Diagnosis:   70 y.o. gentleman with Stage T1c adenocarcinoma of the prostate with Gleason score of 3+4, and PSA of 7.32.  Interval Since Last Radiation:  4.5 weeks  03/09/21 - 04/15/21:  The prostate was treated to 70 Gy in 28 fractions of 2.5 Gy  Narrative:  I spoke with the patient to conduct his routine scheduled 1 month follow up visit via telephone to spare the patient unnecessary potential exposure in the healthcare setting during the current COVID-19 pandemic.  The patient was notified in advance and gave permission to proceed with this visit format.  He tolerated radiation treatment relatively well with only minor urinary irritation and modest fatigue.  He reported frequency, urgency, weak flow of stream and occasional dysuria which were all improved with the use of AZO and Flomax.  He also experienced some occasional constipation but denied abdominal pain, nausea, vomiting or diarrhea.                              On review of systems, the patient states that he is doing well in general.  At this point, he is having rare, mild dysuria and mild increased frequency but overall, feels like he is almost back to his baseline regarding his LUTS.  He is taking Flomax daily as prescribed and reports significant improvement in his flow of stream and ability to empty his bladder.  He specifically denies excessive daytime frequency, urgency, gross hematuria, straining to void, incomplete bladder emptying or incontinence.  He reports a healthy appetite and is maintaining his weight.  He denies abdominal pain, nausea, vomiting, diarrhea or constipation.  His energy level is gradually improving and overall, he is pleased with his progress to date.  ALLERGIES:  has No Known  Allergies.  Meds: Current Outpatient Medications  Medication Sig Dispense Refill   amLODipine (NORVASC) 10 MG tablet TAKE 1 TABLET(10 MG) BY MOUTH DAILY 90 tablet 1   atorvastatin (LIPITOR) 40 MG tablet TAKE 1 TABLET BY MOUTH DAILY 90 tablet 3   ciprofloxacin (CIPRO) 500 MG tablet Take 1 tablet (500 mg total) by mouth 2 (two) times daily. 14 tablet 0   fluticasone (FLONASE) 50 MCG/ACT nasal spray Place 1-2 sprays into both nostrils daily as needed for allergies or rhinitis. (Patient not taking: Reported on 12/23/2020)     ibuprofen (ADVIL) 200 MG tablet Take 200 mg by mouth every 6 (six) hours as needed for fever or mild pain.     lisinopril (ZESTRIL) 40 MG tablet TAKE 1 TABLET BY MOUTH EVERY DAY 90 tablet 0   meloxicam (MOBIC) 7.5 MG tablet TAKE 1 TABLET(7.5 MG) BY MOUTH DAILY (Patient taking differently: Take 7.5 mg by mouth daily.) 90 tablet 0   phenazopyridine (PYRIDIUM) 200 MG tablet Take 1 tablet (200 mg total) by mouth 3 (three) times daily as needed for pain. 30 tablet 5   QUEtiapine (SEROQUEL) 50 MG tablet TAKE 1 TABLET BY MOUTH AT BEDTIME 90 tablet 1   sildenafil (VIAGRA) 50 MG tablet Take 50 mg by mouth as needed for erectile dysfunction.     tamsulosin (FLOMAX) 0.4 MG CAPS capsule Take 2 capsules (0.8 mg total) by mouth daily after supper. 60 capsule 1  No current facility-administered medications for this encounter.    Physical Findings:  vitals were not taken for this visit.  Pain Assessment Pain Score: 5  (back pain)/10 Unable to assess due to telephone follow-up visit format.  Lab Findings: Lab Results  Component Value Date   WBC 7.2 08/20/2020   HGB 15.8 08/20/2020   HCT 46.0 08/20/2020   MCV 84.6 08/20/2020   PLT 220 08/20/2020     Radiographic Findings: No results found.  Impression/Plan: 1. 70 y.o. gentleman with Stage T1c adenocarcinoma of the prostate with Gleason score of 3+4, and PSA of 7.32. He will continue to follow up with urology for ongoing PSA  determinations but does not currently have an appointment scheduled with Dr. Louis Meckel to his knowledge.  He is tolerating Flomax daily with improved LUTS so he will continue taking this as prescribed until his follow-up with urology.  He understands what to expect with regards to PSA monitoring going forward and anticipates a call from [facility] to coordinate his first posttreatment PSA in mid to late January 2023, prior to a follow-up visit with Dr. Louis Meckel. I will look forward to following his response to treatment via correspondence with urology, and would be happy to continue to participate in his care if clinically indicated. I talked to the patient about what to expect in the future, including his risk for erectile dysfunction and rectal bleeding. I encouraged him to call or return to the office if he has any questions regarding his previous radiation or possible radiation side effects. He was comfortable with this plan and will follow up as needed.     Nicholos Johns, PA-C

## 2021-05-23 ENCOUNTER — Other Ambulatory Visit: Payer: Self-pay | Admitting: Adult Health

## 2021-05-23 DIAGNOSIS — I10 Essential (primary) hypertension: Secondary | ICD-10-CM

## 2021-06-08 DIAGNOSIS — M25532 Pain in left wrist: Secondary | ICD-10-CM | POA: Diagnosis not present

## 2021-06-08 DIAGNOSIS — M542 Cervicalgia: Secondary | ICD-10-CM | POA: Diagnosis not present

## 2021-06-08 DIAGNOSIS — M545 Low back pain, unspecified: Secondary | ICD-10-CM | POA: Diagnosis not present

## 2021-06-08 DIAGNOSIS — M546 Pain in thoracic spine: Secondary | ICD-10-CM | POA: Diagnosis not present

## 2021-07-01 ENCOUNTER — Encounter: Payer: Self-pay | Admitting: Adult Health

## 2021-07-01 ENCOUNTER — Ambulatory Visit (INDEPENDENT_AMBULATORY_CARE_PROVIDER_SITE_OTHER): Payer: Medicare Other | Admitting: Adult Health

## 2021-07-01 ENCOUNTER — Other Ambulatory Visit: Payer: Self-pay | Admitting: Adult Health

## 2021-07-01 VITALS — BP 160/90 | HR 79 | Temp 98.5°F | Ht 68.0 in | Wt 200.0 lb

## 2021-07-01 DIAGNOSIS — M546 Pain in thoracic spine: Secondary | ICD-10-CM | POA: Diagnosis not present

## 2021-07-01 DIAGNOSIS — M542 Cervicalgia: Secondary | ICD-10-CM

## 2021-07-01 MED ORDER — MELOXICAM 15 MG PO TABS: 15.0000 mg | ORAL_TABLET | Freq: Every day | ORAL | 1 refills | Status: DC

## 2021-07-01 MED ORDER — METHYLPREDNISOLONE 4 MG PO TBPK: ORAL_TABLET | ORAL | 0 refills | Status: DC

## 2021-07-01 NOTE — Progress Notes (Signed)
Subjective:    Patient ID: Frank [name], male    DOB: [birth year], 71 y.o.   MRN: [id number]  HPI  71 year old male who  has a past medical history of Anxiety, Depression, Drug addiction ([place]), ETOH abuse, High cholesterol, Hypertensive urgency, and Tinnitus.  He presents to the office today for chronic back pain that he has had since his 20's. He reports that he has been seen by orthopedics at this time and no cause has been found. He reports that it has been gradually been becoming worse and over the last 2-3 weeks it has been the worse it has ever been.   He reports a " sharp sheering pain" in the middle of his back. Worse with twisting/turning/bending. Feels as though his muscles are " really tight"   Reports was seen recently at [facility] orthopedic and sports medicine for a broken left hand, he had them check a xray of his spine and reports " they saw some arthritis but nothing to explain the pain" ( I do not have this report) Was given a course of flexeril and prednisone. Reports prednisone helped but as soon as it stopped it the pain returned.  Review of Systems See HPI   Past Medical History:  Diagnosis Date   Anxiety    Depression    Drug addiction ([place])    ETOH abuse    High cholesterol    Hypertensive urgency    Tinnitus     Social History   Socioeconomic History   Marital status: Divorced    Spouse name: Not on file   Number of children: Not on file   Years of education: Not on file   Highest education level: Not on file  Occupational History   Not on file  Tobacco Use   Smoking status: Never   Smokeless tobacco: Never  Vaping Use   Vaping Use: Never used  Substance and Sexual Activity   Alcohol use: Yes    Alcohol/week: 10.0 standard drinks    Types: 10 Glasses of wine per week   Drug use: Yes    Types: Other-see comments, "Crack" cocaine, Cocaine, Marijuana    Comment: current marijuana use  twice a month    Sexual activity: Not on file    Comment:  oxycontin  Other Topics Concern   Not on file  Social History Narrative   Works as a Education officer, 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    Social Determinants of Health   Financial Resource Strain: Low Risk    Difficulty of Paying Living Expenses: Not hard at all  Food Insecurity: No Food Insecurity   Worried About Charity fundraiser in the Last Year: Never true   Arboriculturist in the Last Year: Never true  Transportation Needs: No Transportation Needs   Lack of Transportation (Medical): No   Lack of Transportation (Non-Medical): No  Physical Activity: Insufficiently Active   Days of Exercise per Week: 3 days   Minutes of Exercise per Session: 30 min  Stress: No Stress Concern Present   Feeling of Stress : Not at all  Social Connections: Socially Isolated   Frequency of Communication with Friends and Family: More than three times a week   Frequency of Social Gatherings with Friends and Family: More than three times a week   Attends Religious Services: Never   Marine scientist or Organizations: No  Attends Archivist Meetings: Never   Marital Status: Divorced  Human resources officer Violence: Not At Risk   Fear of Current or Ex-Partner: No   Emotionally Abused: No   Physically Abused: No   Sexually Abused: No    Past Surgical History:  Procedure Laterality Date   SKIN CANCER EXCISION  2015   TONSILLECTOMY     WISDOM TOOTH EXTRACTION     nausea post op    Family History  Problem Relation Age of Onset   Breast cancer Mother        Died at 41   Lung cancer Father    Prostate cancer Father        Died at 41    Colon cancer Paternal Grandmother    Esophageal cancer Neg Hx    Rectal cancer Neg Hx    Stomach cancer Neg Hx    Colon polyps Neg Hx     No Known Allergies  Current Outpatient Medications on File Prior to Visit  Medication Sig Dispense Refill   amLODipine (NORVASC) 10 MG tablet TAKE 1 TABLET(10 MG) BY MOUTH  DAILY 90 tablet 1   atorvastatin (LIPITOR) 40 MG tablet TAKE 1 TABLET BY MOUTH DAILY 90 tablet 3   ibuprofen (ADVIL) 200 MG tablet Take 200 mg by mouth every 6 (six) hours as needed for fever or mild pain.     lisinopril (ZESTRIL) 40 MG tablet TAKE 1 TABLET BY MOUTH EVERY DAY 90 tablet 0   QUEtiapine (SEROQUEL) 50 MG tablet TAKE 1 TABLET BY MOUTH AT BEDTIME 90 tablet 1   sildenafil (VIAGRA) 50 MG tablet Take 50 mg by mouth as needed for erectile dysfunction.     tamsulosin (FLOMAX) 0.4 MG CAPS capsule Take 2 capsules (0.8 mg total) by mouth daily after supper. 60 capsule 1   cyclobenzaprine (FLEXERIL) 10 MG tablet Take 10 mg by mouth every 8 (eight) hours as needed.     No current facility-administered medications on file prior to visit.    BP (!) 160/90 (BP Location: Left Arm, Cuff Size: Normal)    Pulse 79    Temp 98.5 F (36.9 C) (Oral)    Ht 5\' 8"  (1.727 m)    Wt 200 lb (90.7 kg)    SpO2 96%    BMI 30.41 kg/m       Objective:   Physical Exam Vitals and nursing note reviewed.  Constitutional:      Appearance: Normal appearance.  Cardiovascular:     Rate and Rhythm: Normal rate and regular rhythm.     Pulses: Normal pulses.     Heart sounds: Normal heart sounds.  Pulmonary:     Effort: Pulmonary effort is normal.     Breath sounds: Normal breath sounds.  Musculoskeletal:     Cervical back: Spasms, tenderness, bony tenderness and crepitus present. No swelling. Pain with movement present. Normal range of motion.     Thoracic back: Spasms, tenderness and bony tenderness present. No swelling. Decreased range of motion. No scoliosis.     Lumbar back: Normal.  Neurological:     General: No focal deficit present.     Mental Status: He is alert and oriented to person, place, and time.  Psychiatric:        Mood and Affect: Mood normal.        Behavior: Behavior normal.        Thought Content: Thought content normal.        Judgment: Judgment normal.  Assessment & Plan:  1.  Cervical spine pain -He did have tenderness throughout right-sided paraspinal muscles from cervical to the lower thoracic spine.  There was some crepitus with movement in his cervical and thoracic spine as well.  We will order MRIs of cervical and thoracic spine.  Prescribed Medrol Dosepak and Mobic at this time.  Was advised not to not take any other anti-inflammatories with Mobic.  Consider referral to orthopedics or neurosurgery depending on what MRIs show - MR Cervical Spine Wo Contrast; Future - Ambulatory referral to Physical Therapy - methylPREDNISolone (MEDROL DOSEPAK) 4 MG TBPK tablet; Take as directed  Dispense: 21 tablet; Refill: 0 - meloxicam (MOBIC) 15 MG tablet; Take 1 tablet (15 mg total) by mouth daily.  Dispense: 30 tablet; Refill: 1  2. Thoracic spine pain  - MR Thoracic Spine Wo Contrast; Future - Ambulatory referral to Physical Therapy - methylPREDNISolone (MEDROL DOSEPAK) 4 MG TBPK tablet; Take as directed  Dispense: 21 tablet; Refill: 0 - meloxicam (MOBIC) 15 MG tablet; Take 1 tablet (15 mg total) by mouth daily.  Dispense: 30 tablet; Refill: 1  Dorothyann Peng, NP

## 2021-07-15 ENCOUNTER — Telehealth: Payer: Self-pay | Admitting: Adult Health

## 2021-07-15 NOTE — Telephone Encounter (Signed)
Left message on machine for patient to call to get scheduled for survivorship care plan visit.  MyChart message sent  Wilber Bihari, NP 07/15/21 2:21 PM Medical Oncology and Hematology [facility] [place], [place] [address] Tel. [phone]    Fax. [phone]

## 2021-07-17 ENCOUNTER — Telehealth: Payer: Self-pay | Admitting: *Deleted

## 2021-07-18 ENCOUNTER — Ambulatory Visit
Admission: RE | Admit: 2021-07-18 | Discharge: 2021-07-18 | Disposition: A | Discharge status: Home / Self Care | Payer: Medicare Other | Source: Ambulatory Visit | Attending: Adult Health | Admitting: Adult Health

## 2021-07-18 ENCOUNTER — Other Ambulatory Visit: Payer: Self-pay

## 2021-07-18 DIAGNOSIS — M2578 Osteophyte, vertebrae: Secondary | ICD-10-CM | POA: Diagnosis not present

## 2021-07-18 DIAGNOSIS — M542 Cervicalgia: Secondary | ICD-10-CM

## 2021-07-18 DIAGNOSIS — M47812 Spondylosis without myelopathy or radiculopathy, cervical region: Secondary | ICD-10-CM | POA: Diagnosis not present

## 2021-07-18 DIAGNOSIS — M5124 Other intervertebral disc displacement, thoracic region: Secondary | ICD-10-CM | POA: Diagnosis not present

## 2021-07-18 DIAGNOSIS — M546 Pain in thoracic spine: Secondary | ICD-10-CM

## 2021-07-18 DIAGNOSIS — M4802 Spinal stenosis, cervical region: Secondary | ICD-10-CM | POA: Diagnosis not present
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
## 2021-07-21 ENCOUNTER — Encounter: Payer: Self-pay | Admitting: *Deleted

## 2021-07-21 ENCOUNTER — Inpatient Hospital Stay: Payer: Medicare Other | Attending: Urology | Admitting: *Deleted

## 2021-07-21 ENCOUNTER — Other Ambulatory Visit: Payer: Self-pay

## 2021-07-21 ENCOUNTER — Telehealth: Payer: Self-pay | Admitting: Adult Health

## 2021-07-21 VITALS — BP 145/84 | HR 87 | Temp 98.5°F | Resp 18 | Ht 68.0 in | Wt 199.8 lb

## 2021-07-21 DIAGNOSIS — C61 Malignant neoplasm of prostate: Secondary | ICD-10-CM

## 2021-07-21 DIAGNOSIS — M546 Pain in thoracic spine: Secondary | ICD-10-CM

## 2021-07-21 NOTE — Progress Notes (Signed)
°   SCP reviewed and completed. SDOH assessed and completed. Vitals are within normal limits. Pt has chronic back pain and rates it 4/10. He has maintenance meds he takes for back pain. Pt has returned to urologist 2 weeks ago and PSA number are down to 1.5  from 7.32 before treatment. He is very pleased with this. He denies any pain with urination. He does have some urgency in the daytime. He does not have as much urinary frequency since he's been on Flomax. He sleeps most nights without having to use bathroom.Bowels are back to normal. Pt exercises by cycling, walking and hiking. Last colonoscopy performed 07/11/2020. I encouraged pt to continue dermatological care since 6-8 years ago he had a skin cancer removed to the left arm. No complaints of fatigue. Pt does not take the flu vaccine.

## 2021-07-21 NOTE — Telephone Encounter (Signed)
Updated patient on his MRI reports which showed  .Multilevel cervical spondylosis superimposed on a congenitally small cervical spinal canal. There is resulting effacement of the CSF surrounding the cord at C4-5 and C6-7. No cord deformity or abnormal cord signal.  2. Multilevel cervical facet hypertrophy, asymmetric to the left, contributing to lateral recess and foraminal narrowing as detailed above. The foraminal narrowing is greatest at C6-7 where it is severe bilaterally and may contribute to encroachment on either C7 nerve root.  3. Small disc protrusions at T8-9 and T9-10, without cord deformity or definite nerve root encroachment.

## 2021-07-27 DIAGNOSIS — M47812 Spondylosis without myelopathy or radiculopathy, cervical region: Secondary | ICD-10-CM | POA: Diagnosis not present

## 2021-07-27 DIAGNOSIS — I10 Essential (primary) hypertension: Secondary | ICD-10-CM | POA: Diagnosis not present

## 2021-07-27 DIAGNOSIS — M549 Dorsalgia, unspecified: Secondary | ICD-10-CM | POA: Diagnosis not present

## 2021-07-27 DIAGNOSIS — M542 Cervicalgia: Secondary | ICD-10-CM | POA: Diagnosis not present

## 2021-07-31 ENCOUNTER — Other Ambulatory Visit: Payer: Self-pay | Admitting: Adult Health

## 2021-07-31 ENCOUNTER — Telehealth: Payer: Self-pay | Admitting: Adult Health

## 2021-07-31 DIAGNOSIS — M546 Pain in thoracic spine: Secondary | ICD-10-CM

## 2021-07-31 DIAGNOSIS — M542 Cervicalgia: Secondary | ICD-10-CM

## 2021-07-31 NOTE — Telephone Encounter (Signed)
Ok for 90 day supply.  

## 2021-07-31 NOTE — Telephone Encounter (Addendum)
Pt has seen neurosurgeon and he is sch to have back injection on 08-25-2021 and would like to know if cory would call in prednisone for his back pain . Pt does have an  appt on 08-05-2021 with cory [place] #[id number] [place], [place] Phone:  [phone]  Fax:  [phone]

## 2021-07-31 NOTE — Telephone Encounter (Signed)
Spoke to pt and he stated that the neuro doctor sent in the prednisone. No further action needed!

## 2021-08-05 ENCOUNTER — Ambulatory Visit (INDEPENDENT_AMBULATORY_CARE_PROVIDER_SITE_OTHER): Payer: Medicare Other | Admitting: Adult Health

## 2021-08-05 VITALS — BP 136/84 | HR 82 | Wt 200.0 lb

## 2021-08-05 DIAGNOSIS — L02223 Furuncle of chest wall: Secondary | ICD-10-CM

## 2021-08-05 MED ORDER — DOXYCYCLINE HYCLATE 100 MG PO CAPS: 100.0000 mg | ORAL_CAPSULE | Freq: Two times a day (BID) | ORAL | 0 refills | Status: DC

## 2021-08-05 NOTE — Progress Notes (Signed)
Subjective:    Patient ID: Frank [name], male    DOB: 01-Nov-1950, 71 y.o.   MRN: [id number]  HPI  71 year old male who  has a past medical history of Anxiety, Depression, Drug addiction ([place]), ETOH abuse, High cholesterol, Hypertensive urgency, and Tinnitus.  He presents to the office today for an acute issue of concern for an abscess on his right chest wall.  He has not noticed any drainage.  At times area has been tender and slightly red.  No fevers or chills.  Additionally, he did see neurosurgery recently and is planned for cervical spine epidural at the end of February.  Review of Systems See HPI   Past Medical History:  Diagnosis Date   Anxiety    Depression    Drug addiction ([place])    ETOH abuse    High cholesterol    Hypertensive urgency    Tinnitus     Social History   Socioeconomic History   Marital status: Divorced    Spouse name: Not on file   Number of children: Not on file   Years of education: Not on file   Highest education level: Not on file  Occupational History   Not on file  Tobacco Use   Smoking status: Never   Smokeless tobacco: Never  Vaping Use   Vaping Use: Never used  Substance and Sexual Activity   Alcohol use: Yes    Alcohol/week: 10.0 standard drinks    Types: 10 Glasses of wine per week   Drug use: Yes    Types: Other-see comments, "Crack" cocaine, Cocaine, Marijuana    Comment: current marijuana use  twice a month    Sexual activity: Not on file    Comment: oxycontin  Other Topics Concern   Not on file  Social History Narrative   Works as a Education officer, 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    Social Determinants of Health   Financial Resource Strain: Low Risk    Difficulty of Paying Living Expenses: Not hard at all  Food Insecurity: No Food Insecurity   Worried About Charity fundraiser in the Last Year: Never true   Arboriculturist in the Last Year: Never true  Transportation Needs: No  Transportation Needs   Lack of Transportation (Medical): No   Lack of Transportation (Non-Medical): No  Physical Activity: Insufficiently Active   Days of Exercise per Week: 3 days   Minutes of Exercise per Session: 30 min  Stress: No Stress Concern Present   Feeling of Stress : Not at all  Social Connections: Socially Isolated   Frequency of Communication with Friends and Family: More than three times a week   Frequency of Social Gatherings with Friends and Family: More than three times a week   Attends Religious Services: Never   Marine scientist or Organizations: No   Attends Music therapist: Never   Marital Status: Divorced  Human resources officer Violence: Not At Risk   Fear of Current or Ex-Partner: No   Emotionally Abused: No   Physically Abused: No   Sexually Abused: No    Past Surgical History:  Procedure Laterality Date   SKIN CANCER EXCISION  2015   TONSILLECTOMY     WISDOM TOOTH EXTRACTION     nausea post op    Family History  Problem Relation Age of Onset   Breast cancer Mother  Died at 46   Lung cancer Father    Prostate cancer Father        Died at 48    Colon cancer Paternal Grandmother    Esophageal cancer Neg Hx    Rectal cancer Neg Hx    Stomach cancer Neg Hx    Colon polyps Neg Hx     No Known Allergies  Current Outpatient Medications on File Prior to Visit  Medication Sig Dispense Refill   amLODipine (NORVASC) 10 MG tablet TAKE 1 TABLET(10 MG) BY MOUTH DAILY 90 tablet 1   atorvastatin (LIPITOR) 40 MG tablet TAKE 1 TABLET BY MOUTH DAILY 90 tablet 3   cyclobenzaprine (FLEXERIL) 10 MG tablet Take 10 mg by mouth every 8 (eight) hours as needed.     lisinopril (ZESTRIL) 40 MG tablet TAKE 1 TABLET BY MOUTH EVERY DAY 90 tablet 0   meloxicam (MOBIC) 15 MG tablet TAKE 1 TABLET(15 MG) BY MOUTH DAILY 90 tablet 1   QUEtiapine (SEROQUEL) 50 MG tablet TAKE 1 TABLET BY MOUTH AT BEDTIME 90 tablet 1   sildenafil (VIAGRA) 50 MG tablet Take  50 mg by mouth as needed for erectile dysfunction.     tamsulosin (FLOMAX) 0.4 MG CAPS capsule Take 2 capsules (0.8 mg total) by mouth daily after supper. 60 capsule 1   ibuprofen (ADVIL) 200 MG tablet Take 200 mg by mouth every 6 (six) hours as needed for fever or mild pain. (Patient not taking: Reported on 08/05/2021)     No current facility-administered medications on file prior to visit.    BP 136/84    Pulse 82    Wt 200 lb (90.7 kg)    SpO2 96%    BMI 30.41 kg/m       Objective:   Physical Exam Vitals and nursing note reviewed.  Constitutional:      Appearance: Normal appearance.  Skin:    General: Skin is warm and dry.     Capillary Refill: Capillary refill takes less than 2 seconds.     Findings: Erythema present.     Comments: Quarter sized nonfluctuant abscess on right chest wall.  Localized erythema.  Has pain with palpation.  No active drainage noted  Neurological:     General: No focal deficit present.     Mental Status: He is alert and oriented to person, place, and time.  Psychiatric:        Mood and Affect: Mood normal.        Behavior: Behavior normal.        Thought Content: Thought content normal.        Judgment: Judgment normal.      Assessment & Plan:  1. Furuncle of chest wall -Treat with oral antibiotics.  Advised warm compress.  If this comes to ahead then follow-up for incision and drainage. - doxycycline (VIBRAMYCIN) 100 MG capsule; Take 1 capsule (100 mg total) by mouth 2 (two) times daily.  Dispense: 20 capsule; Refill: 0  Dorothyann Peng, NP

## 2021-08-12 ENCOUNTER — Telehealth: Payer: Self-pay | Admitting: Adult Health

## 2021-08-12 NOTE — Telephone Encounter (Signed)
Left message for patient to call back and schedule Medicare Annual Wellness Visit (AWV) either virtually or in office. Left  my [name] number [phone]   Last AWV 09/12/20  please schedule at anytime with [facility] Nurse Health Advisor 1 or 2   This should be a 45 minute visit.   Awv can be schedule calendar year [facility]

## 2021-08-17 ENCOUNTER — Other Ambulatory Visit: Payer: Self-pay | Admitting: Radiation Oncology

## 2021-08-17 MED ORDER — TAMSULOSIN HCL 0.4 MG PO CAPS: 0.8000 mg | ORAL_CAPSULE | Freq: Every day | ORAL | 5 refills | Status: AC

## 2021-08-25 ENCOUNTER — Other Ambulatory Visit: Payer: Self-pay | Admitting: Adult Health

## 2021-08-25 DIAGNOSIS — M47812 Spondylosis without myelopathy or radiculopathy, cervical region: Secondary | ICD-10-CM | POA: Diagnosis not present

## 2021-08-25 DIAGNOSIS — I10 Essential (primary) hypertension: Secondary | ICD-10-CM

## 2021-09-02 ENCOUNTER — Ambulatory Visit (INDEPENDENT_AMBULATORY_CARE_PROVIDER_SITE_OTHER): Payer: Medicare Other | Admitting: Adult Health

## 2021-09-02 ENCOUNTER — Encounter: Payer: Self-pay | Admitting: Adult Health

## 2021-09-02 VITALS — BP 122/88 | HR 87 | Temp 98.7°F | Ht 68.0 in | Wt 198.0 lb

## 2021-09-02 DIAGNOSIS — M542 Cervicalgia: Secondary | ICD-10-CM

## 2021-09-02 DIAGNOSIS — Z85828 Personal history of other malignant neoplasm of skin: Secondary | ICD-10-CM

## 2021-09-02 DIAGNOSIS — M546 Pain in thoracic spine: Secondary | ICD-10-CM

## 2021-09-02 NOTE — Progress Notes (Signed)
? ?Subjective:  ? ? Patient ID: Frank [name], male    DOB: [birth year], 71 y.o.   MRN: [id number] ? ?HPI ?71 year old male who  has a past medical history of Anxiety, Depression, Drug addiction ([place]), High cholesterol, Hypertensive urgency, and Tinnitus. ? ?He presents to the office today for follow-up regarding chronic back pain.  He reports that he was seen by neurosurgery recently and had 4 epidural injections done in his cervical spine region  These did not help and he believes actually made things worse.  He does start physical therapy tomorrow with dry needling.  He reports that "my back always feels like it is on fire and feels extremely tight almost like a rubber band and its being wound up."  He is wondering if seeing an orthopedist would be beneficial for him. ? ?He also has a history of skin cancer, has not been to see his dermatologist in multiple years, is unsure of who he saw in the past.  Would like a referral to dermatology for skin exam ? ?Review of Systems ?See HPI  ? ?Past Medical History:  ?Diagnosis Date  ? Anxiety   ? Depression   ? Drug addiction ([place])   ? High cholesterol   ? Hypertensive urgency   ? Tinnitus   ? ? ?Social History  ? ?Socioeconomic History  ? Marital status: Divorced  ?  Spouse name: Not on file  ? Number of children: Not on file  ? Years of education: Not on file  ? Highest education level: Not on file  ?Occupational History  ? Not on file  ?Tobacco Use  ? Smoking status: Never  ? Smokeless tobacco: Never  ?Vaping Use  ? Vaping Use: Never used  ?Substance and Sexual Activity  ? Alcohol use: Yes  ?  Alcohol/week: 10.0 standard drinks  ?  Types: 10 Glasses of wine per week  ? Drug use: Yes  ?  Types: Other-see comments, "Crack" cocaine, Cocaine, Marijuana  ?  Comment: current marijuana use  twice a month   ? Sexual activity: Not on file  ?  Comment: oxycontin  ?Other Topics Concern  ? Not on file  ?Social History Narrative  ? Works as a Education officer, community for [employer]   ? Divorced  but is now engaged.   ? 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   ? ?Social Determinants of Health  ? ?Financial Resource Strain: Low Risk   ? Difficulty of Paying Living Expenses: Not hard at all  ?Food Insecurity: No Food Insecurity  ? Worried About Charity fundraiser in the Last Year: Never true  ? Ran Out of Food in the Last Year: Never true  ?Transportation Needs: No Transportation Needs  ? Lack of Transportation (Medical): No  ? Lack of Transportation (Non-Medical): No  ?Physical Activity: Insufficiently Active  ? Days of Exercise per Week: 3 days  ? Minutes of Exercise per Session: 30 min  ?Stress: No Stress Concern Present  ? Feeling of Stress : Not at all  ?Social Connections: Socially Isolated  ? Frequency of Communication with Friends and Family: More than three times a week  ? Frequency of Social Gatherings with Friends and Family: More than three times a week  ? Attends Religious Services: Never  ? Active Member of Clubs or Organizations: No  ? Attends Archivist Meetings: Never  ? Marital Status: Divorced  ?Intimate Partner Violence: Not At Risk  ? Fear of Current or Ex-Partner: No  ?  Emotionally Abused: No  ? Physically Abused: No  ? Sexually Abused: No  ? ? ?Past Surgical History:  ?Procedure Laterality Date  ? SKIN CANCER EXCISION  2015  ? TONSILLECTOMY    ? WISDOM TOOTH EXTRACTION    ? nausea post op  ? ? ?Family History  ?Problem Relation Age of Onset  ? Breast cancer Mother   ?     Died at 80  ? Lung cancer Father   ? Prostate cancer Father   ?     Died at 55   ? Colon cancer Paternal Grandmother   ? Esophageal cancer Neg Hx   ? Rectal cancer Neg Hx   ? Stomach cancer Neg Hx   ? Colon polyps Neg Hx   ? ? ?No Known Allergies ? ?Current Outpatient Medications on File Prior to Visit  ?Medication Sig Dispense Refill  ? amLODipine (NORVASC) 10 MG tablet TAKE 1 TABLET(10 MG) BY MOUTH DAILY 90 tablet 1  ? atorvastatin (LIPITOR) 40 MG tablet TAKE 1 TABLET BY MOUTH DAILY 90 tablet 3  ? cyclobenzaprine (FLEXERIL) 10  MG tablet Take 10 mg by mouth every 8 (eight) hours as needed.    ? doxycycline (VIBRAMYCIN) 100 MG capsule Take 1 capsule (100 mg total) by mouth 2 (two) times daily. 20 capsule 0  ? ibuprofen (ADVIL) 200 MG tablet Take 200 mg by mouth every 6 (six) hours as needed for fever or mild pain.    ? lisinopril (ZESTRIL) 40 MG tablet TAKE 1 TABLET BY MOUTH EVERY DAY 90 tablet 0  ? meloxicam (MOBIC) 15 MG tablet TAKE 1 TABLET(15 MG) BY MOUTH DAILY 90 tablet 1  ? QUEtiapine (SEROQUEL) 50 MG tablet TAKE 1 TABLET BY MOUTH AT BEDTIME 90 tablet 1  ? sildenafil (VIAGRA) 50 MG tablet Take 50 mg by mouth as needed for erectile dysfunction.    ? tamsulosin (FLOMAX) 0.4 MG CAPS capsule Take 2 capsules (0.8 mg total) by mouth daily after supper. 60 capsule 5  ? ?No current facility-administered medications on file prior to visit.  ? ? ?BP 122/88   Pulse 87   Temp 98.7 ?F (37.1 ?C) (Oral)   Ht '5\' 8"'$  (1.727 m)   Wt 198 lb (89.8 kg)   SpO2 96%   BMI 30.11 kg/m?  ? ? ?   ?Objective:  ? Physical Exam ?Vitals and nursing note reviewed.  ?Constitutional:   ?   Appearance: Normal appearance.  ?Skin: ?   General: Skin is warm and dry.  ?Neurological:  ?   General: No focal deficit present.  ?   Mental Status: He is alert and oriented to person, place, and time.  ?Psychiatric:     ?   Mood and Affect: Mood normal.     ?   Behavior: Behavior normal.     ?   Thought Content: Thought content normal.     ?   Judgment: Judgment normal.  ? ?   ?Assessment & Plan:  ?1. Cervical spine pain ?-We decided to hold off and see how he responds to physical therapy and dry needling.  We can always refer to orthopedics to get their opinion on his chronic back pain.  He will follow-up with me after he has completed some of the PT. ? ?2. Thoracic spine pain ? ? ?3. History of skin cancer ? ?- Ambulatory referral to Dermatology ? ?Dorothyann Peng, NP ? ?

## 2021-09-03 DIAGNOSIS — R202 Paresthesia of skin: Secondary | ICD-10-CM | POA: Diagnosis not present

## 2021-09-03 DIAGNOSIS — M542 Cervicalgia: Secondary | ICD-10-CM | POA: Diagnosis not present

## 2021-09-03 DIAGNOSIS — R2689 Other abnormalities of gait and mobility: Secondary | ICD-10-CM | POA: Diagnosis not present

## 2021-09-03 DIAGNOSIS — M5489 Other dorsalgia: Secondary | ICD-10-CM | POA: Diagnosis not present

## 2021-09-08 DIAGNOSIS — R202 Paresthesia of skin: Secondary | ICD-10-CM | POA: Diagnosis not present

## 2021-09-08 DIAGNOSIS — M542 Cervicalgia: Secondary | ICD-10-CM | POA: Diagnosis not present

## 2021-09-08 DIAGNOSIS — M5489 Other dorsalgia: Secondary | ICD-10-CM | POA: Diagnosis not present

## 2021-09-08 DIAGNOSIS — R2689 Other abnormalities of gait and mobility: Secondary | ICD-10-CM | POA: Diagnosis not present

## 2021-09-14 DIAGNOSIS — R2689 Other abnormalities of gait and mobility: Secondary | ICD-10-CM | POA: Diagnosis not present

## 2021-09-14 DIAGNOSIS — R202 Paresthesia of skin: Secondary | ICD-10-CM | POA: Diagnosis not present

## 2021-09-14 DIAGNOSIS — M5489 Other dorsalgia: Secondary | ICD-10-CM | POA: Diagnosis not present

## 2021-09-14 DIAGNOSIS — M542 Cervicalgia: Secondary | ICD-10-CM | POA: Diagnosis not present

## 2021-09-17 ENCOUNTER — Telehealth: Payer: Self-pay | Admitting: Adult Health

## 2021-09-17 DIAGNOSIS — R202 Paresthesia of skin: Secondary | ICD-10-CM | POA: Diagnosis not present

## 2021-09-17 DIAGNOSIS — M5489 Other dorsalgia: Secondary | ICD-10-CM | POA: Diagnosis not present

## 2021-09-17 DIAGNOSIS — M542 Cervicalgia: Secondary | ICD-10-CM | POA: Diagnosis not present

## 2021-09-17 DIAGNOSIS — R2689 Other abnormalities of gait and mobility: Secondary | ICD-10-CM | POA: Diagnosis not present

## 2021-09-17 NOTE — Telephone Encounter (Signed)
Left message for patient to call back and schedule Medicare Annual Wellness Visit (AWV) either virtually or in office. Left  my [name] number [phone] ? ? ?Last AWV ;09/12/20 ? please schedule at anytime with [facility] Nurse Health Advisor 1 or 2 ? ? ?This should be a 45 minute visit.  ?

## 2021-09-21 DIAGNOSIS — M5489 Other dorsalgia: Secondary | ICD-10-CM | POA: Diagnosis not present

## 2021-09-21 DIAGNOSIS — R202 Paresthesia of skin: Secondary | ICD-10-CM | POA: Diagnosis not present

## 2021-09-21 DIAGNOSIS — M542 Cervicalgia: Secondary | ICD-10-CM | POA: Diagnosis not present

## 2021-09-21 DIAGNOSIS — R2689 Other abnormalities of gait and mobility: Secondary | ICD-10-CM | POA: Diagnosis not present

## 2021-09-24 DIAGNOSIS — M542 Cervicalgia: Secondary | ICD-10-CM | POA: Diagnosis not present

## 2021-09-24 DIAGNOSIS — R2689 Other abnormalities of gait and mobility: Secondary | ICD-10-CM | POA: Diagnosis not present

## 2021-09-24 DIAGNOSIS — M5489 Other dorsalgia: Secondary | ICD-10-CM | POA: Diagnosis not present

## 2021-09-24 DIAGNOSIS — R202 Paresthesia of skin: Secondary | ICD-10-CM | POA: Diagnosis not present

## 2021-10-01 ENCOUNTER — Other Ambulatory Visit: Payer: Self-pay | Admitting: Adult Health

## 2021-10-01 DIAGNOSIS — M5489 Other dorsalgia: Secondary | ICD-10-CM | POA: Diagnosis not present

## 2021-10-01 DIAGNOSIS — I10 Essential (primary) hypertension: Secondary | ICD-10-CM

## 2021-10-01 DIAGNOSIS — M542 Cervicalgia: Secondary | ICD-10-CM | POA: Diagnosis not present

## 2021-10-01 DIAGNOSIS — R2689 Other abnormalities of gait and mobility: Secondary | ICD-10-CM | POA: Diagnosis not present

## 2021-10-01 DIAGNOSIS — R202 Paresthesia of skin: Secondary | ICD-10-CM | POA: Diagnosis not present

## 2021-10-05 DIAGNOSIS — R2689 Other abnormalities of gait and mobility: Secondary | ICD-10-CM | POA: Diagnosis not present

## 2021-10-05 DIAGNOSIS — M5489 Other dorsalgia: Secondary | ICD-10-CM | POA: Diagnosis not present

## 2021-10-05 DIAGNOSIS — R202 Paresthesia of skin: Secondary | ICD-10-CM | POA: Diagnosis not present

## 2021-10-05 DIAGNOSIS — M542 Cervicalgia: Secondary | ICD-10-CM | POA: Diagnosis not present

## 2021-10-08 DIAGNOSIS — R2689 Other abnormalities of gait and mobility: Secondary | ICD-10-CM | POA: Diagnosis not present

## 2021-10-08 DIAGNOSIS — M5489 Other dorsalgia: Secondary | ICD-10-CM | POA: Diagnosis not present

## 2021-10-08 DIAGNOSIS — M542 Cervicalgia: Secondary | ICD-10-CM | POA: Diagnosis not present

## 2021-10-08 DIAGNOSIS — R202 Paresthesia of skin: Secondary | ICD-10-CM | POA: Diagnosis not present

## 2021-10-12 DIAGNOSIS — R202 Paresthesia of skin: Secondary | ICD-10-CM | POA: Diagnosis not present

## 2021-10-12 DIAGNOSIS — M542 Cervicalgia: Secondary | ICD-10-CM | POA: Diagnosis not present

## 2021-10-12 DIAGNOSIS — R2689 Other abnormalities of gait and mobility: Secondary | ICD-10-CM | POA: Diagnosis not present

## 2021-10-12 DIAGNOSIS — M5489 Other dorsalgia: Secondary | ICD-10-CM | POA: Diagnosis not present

## 2021-10-15 DIAGNOSIS — R2689 Other abnormalities of gait and mobility: Secondary | ICD-10-CM | POA: Diagnosis not present

## 2021-10-15 DIAGNOSIS — M5489 Other dorsalgia: Secondary | ICD-10-CM | POA: Diagnosis not present

## 2021-10-15 DIAGNOSIS — M542 Cervicalgia: Secondary | ICD-10-CM | POA: Diagnosis not present

## 2021-10-15 DIAGNOSIS — R202 Paresthesia of skin: Secondary | ICD-10-CM | POA: Diagnosis not present

## 2021-10-16 ENCOUNTER — Ambulatory Visit (INDEPENDENT_AMBULATORY_CARE_PROVIDER_SITE_OTHER): Payer: Medicare Other

## 2021-10-16 VITALS — Ht 68.0 in | Wt 198.0 lb

## 2021-10-16 DIAGNOSIS — Z0000 Encounter for general adult medical examination without abnormal findings: Secondary | ICD-10-CM

## 2021-10-16 NOTE — Patient Instructions (Addendum)
?Frank [name] , ?Thank you for taking time to come for your Medicare Wellness Visit. I appreciate your ongoing commitment to your health goals. Please review the following plan we discussed and let me know if I can assist you in the future.  ? ?These are the goals we discussed: ? Goals   ? ?   Patient Stated (pt-stated)   ?   I want to continue working on my music and art and travel. ?  ? ?  ?  ?This is a list of the screening recommended for you and due dates:  ?Health Maintenance  ?Topic Date Due  ? Pneumonia Vaccine (2 - PPSV23 if available, else PCV20) 02/02/2022*  ? COVID-19 Vaccine (3 - Pfizer risk series) 02/02/2022*  ? Zoster (Shingles) Vaccine (1 of 2) 02/02/2022*  ? Flu Shot  01/26/2022  ? Tetanus Vaccine  05/14/2022  ? Colon Cancer Screening  07/12/2027  ? Hepatitis C Screening: USPSTF Recommendation to screen - Ages 60-79 yo.  Completed  ? HPV Vaccine  Aged Out  ?*Topic was postponed. The date shown is not the original due date.  ? ? ?Advanced directives: No Patient deferred ? ?Conditions/risks identified: None ? ?Next appointment: Follow up in one year for your annual wellness visit.  ? ?Preventive Care 70 Years and Older, Male ?Preventive care refers to lifestyle choices and visits with your health care provider that can promote health and wellness. ?What does preventive care include? ?A yearly physical exam. This is also called an annual well check. ?Dental exams once or twice a year. ?Routine eye exams. Ask your health care provider how often you should have your eyes checked. ?Personal lifestyle choices, including: ?Daily care of your teeth and gums. ?Regular physical activity. ?Eating a healthy diet. ?Avoiding tobacco and drug use. ?Limiting alcohol use. ?Practicing safe sex. ?Taking low doses of aspirin every day. ?Taking vitamin and mineral supplements as recommended by your health care provider. ?What happens during an annual well check? ?The services and screenings done by your health care  provider during your annual well check will depend on your age, overall health, lifestyle risk factors, and family history of disease. ?Counseling  ?Your health care provider may ask you questions about your: ?Alcohol use. ?Tobacco use. ?Drug use. ?Emotional well-being. ?Home and relationship well-being. ?Sexual activity. ?Eating habits. ?History of falls. ?Memory and ability to understand (cognition). ?Work and work Statistician. ?Screening  ?You may have the following tests or measurements: ?Height, weight, and BMI. ?Blood pressure. ?Lipid and cholesterol levels. These may be checked every 5 years, or more frequently if you are over 21 years old. ?Skin check. ?Lung cancer screening. You may have this screening every year starting at age 75 if you have a 30-pack-year history of smoking and currently smoke or have quit within the past 15 years. ?Fecal occult blood test (FOBT) of the stool. You may have this test every year starting at age 17. ?Flexible sigmoidoscopy or colonoscopy. You may have a sigmoidoscopy every 5 years or a colonoscopy every 10 years starting at age 51. ?Prostate cancer screening. Recommendations will vary depending on your family history and other risks. ?Hepatitis C blood test. ?Hepatitis B blood test. ?Sexually transmitted disease (STD) testing. ?Diabetes screening. This is done by checking your blood sugar (glucose) after you have not eaten for a while (fasting). You may have this done every 1-3 years. ?Abdominal aortic aneurysm (AAA) screening. You may need this if you are a current or former smoker. ?Osteoporosis. You may be  screened starting at age 57 if you are at high risk. ?Talk with your health care provider about your test results, treatment options, and if necessary, the need for more tests. ?Vaccines  ?Your health care provider may recommend certain vaccines, such as: ?Influenza vaccine. This is recommended every year. ?Tetanus, diphtheria, and acellular pertussis (Tdap, Td)  vaccine. You may need a Td booster every 10 years. ?Zoster vaccine. You may need this after age 12. ?Pneumococcal 13-valent conjugate (PCV13) vaccine. One dose is recommended after age 44. ?Pneumococcal polysaccharide (PPSV23) vaccine. One dose is recommended after age 38. ?Talk to your health care provider about which screenings and vaccines you need and how often you need them. ?This information is not intended to replace advice given to you by your health care provider. Make sure you discuss any questions you have with your health care provider. ?Document Released: 07/11/2015 Document Revised: 03/03/2016 Document Reviewed: 04/15/2015 ?Elsevier Interactive Patient Education ? 2017 [place]. ? ?Fall Prevention in the Home ?Falls can cause injuries. They can happen to people of all ages. There are many things you can do to make your home safe and to help prevent falls. ?What can I do on the outside of my home? ?Regularly fix the edges of walkways and driveways and fix any cracks. ?Remove anything that might make you trip as you walk through a door, such as a raised step or threshold. ?Trim any bushes or trees on the path to your home. ?Use bright outdoor lighting. ?Clear any walking paths of anything that might make someone trip, such as rocks or tools. ?Regularly check to see if handrails are loose or broken. Make sure that both sides of any steps have handrails. ?Any raised decks and porches should have guardrails on the edges. ?Have any leaves, snow, or ice cleared regularly. ?Use sand or salt on walking paths during winter. ?Clean up any spills in your garage right away. This includes oil or grease spills. ?What can I do in the bathroom? ?Use night lights. ?Install grab bars by the toilet and in the tub and shower. Do not use towel bars as grab bars. ?Use non-skid mats or decals in the tub or shower. ?If you need to sit down in the shower, use a plastic, non-slip stool. ?Keep the floor dry. Clean up any  water that spills on the floor as soon as it happens. ?Remove soap buildup in the tub or shower regularly. ?Attach bath mats securely with double-sided non-slip rug tape. ?Do not have throw rugs and other things on the floor that can make you trip. ?What can I do in the bedroom? ?Use night lights. ?Make sure that you have a light by your bed that is easy to reach. ?Do not use any sheets or blankets that are too big for your bed. They should not hang down onto the floor. ?Have a firm chair that has side arms. You can use this for support while you get dressed. ?Do not have throw rugs and other things on the floor that can make you trip. ?What can I do in the kitchen? ?Clean up any spills right away. ?Avoid walking on wet floors. ?Keep items that you use a lot in easy-to-reach places. ?If you need to reach something above you, use a strong step stool that has a grab bar. ?Keep electrical cords out of the way. ?Do not use floor polish or wax that makes floors slippery. If you must use wax, use non-skid floor wax. ?Do not have  throw rugs and other things on the floor that can make you trip. ?What can I do with my stairs? ?Do not leave any items on the stairs. ?Make sure that there are handrails on both sides of the stairs and use them. Fix handrails that are broken or loose. Make sure that handrails are as long as the stairways. ?Check any carpeting to make sure that it is firmly attached to the stairs. Fix any carpet that is loose or worn. ?Avoid having throw rugs at the top or bottom of the stairs. If you do have throw rugs, attach them to the floor with carpet tape. ?Make sure that you have a light switch at the top of the stairs and the bottom of the stairs. If you do not have them, ask someone to add them for you. ?What else can I do to help prevent falls? ?Wear shoes that: ?Do not have high heels. ?Have rubber bottoms. ?Are comfortable and fit you well. ?Are closed at the toe. Do not wear sandals. ?If you use a  stepladder: ?Make sure that it is fully opened. Do not climb a closed stepladder. ?Make sure that both sides of the stepladder are locked into place. ?Ask someone to hold it for you, if possible. ?Clearly mark and mak

## 2021-10-16 NOTE — Progress Notes (Signed)
? ?Subjective:  ? Frank [name] is a 71 y.o. male who presents for Medicare Annual/Subsequent preventive examination. ? ?Review of Systems    ?Virtual Visit via Telephone Note ? ?I connected with  Frank [name] on 10/16/21 at 11:30 AM EDT by telephone and verified that I am speaking with the correct person using two identifiers. ? ?Location: ?Patient: Home ?Provider: Office ?Persons participating in the virtual visit: patient/Nurse Health Advisor ?  ?I discussed the limitations, risks, security and privacy concerns of performing an evaluation and management service by telephone and the availability of in person appointments. The patient expressed understanding and agreed to proceed. ? ?Interactive audio and video telecommunications were attempted between this nurse and patient, however failed, due to patient having technical difficulties OR patient did not have access to video capability.  We continued and completed visit with audio only. ? ?Some vital signs may be absent or patient reported.  ? ?Frank Peaches, LPN  ?Cardiac Risk Factors include: advanced age (>95mn, >>50women);hypertension;male gender ? ?   ?Objective:  ?  ?Today's Vitals  ? 10/16/21 1133  ?Weight: 198 lb (89.8 kg)  ?Height: '5\' 8"'$  (1.727 m)  ? ?Body mass index is 30.11 kg/m?. ? ? ?  10/16/2021  ? 11:45 AM 05/20/2021  ?  8:58 AM 02/15/2021  ?  6:43 PM 12/23/2020  ?  9:50 AM 09/12/2020  ?  9:10 AM 08/20/2020  ? 11:40 AM 10/17/2018  ?  6:03 AM  ?Advanced Directives  ?Does Patient Have a Medical Advance Directive? No No No No No No No  ?Would patient like information on creating a medical advance directive? No - Patient declined No - Patient declined No - Patient declined No - Patient declined No - Patient declined No - Patient declined No - Patient declined  ? ? ?Current Medications (verified) ?Outpatient Encounter Medications as of 10/16/2021  ?Medication Sig  ? amLODipine (NORVASC) 10 MG tablet TAKE ONE TABLET BY MOUTH DAILY  ? atorvastatin (LIPITOR) 40  MG tablet TAKE 1 TABLET BY MOUTH DAILY (Patient not taking: Reported on 10/16/2021)  ? cyclobenzaprine (FLEXERIL) 10 MG tablet Take 10 mg by mouth every 8 (eight) hours as needed.  ? doxycycline (VIBRAMYCIN) 100 MG capsule Take 1 capsule (100 mg total) by mouth 2 (two) times daily.  ? ibuprofen (ADVIL) 200 MG tablet Take 200 mg by mouth every 6 (six) hours as needed for fever or mild pain.  ? lisinopril (ZESTRIL) 40 MG tablet TAKE 1 TABLET BY MOUTH EVERY DAY  ? meloxicam (MOBIC) 15 MG tablet TAKE 1 TABLET(15 MG) BY MOUTH DAILY  ? QUEtiapine (SEROQUEL) 50 MG tablet TAKE 1 TABLET BY MOUTH AT BEDTIME  ? sildenafil (VIAGRA) 50 MG tablet Take 50 mg by mouth as needed for erectile dysfunction.  ? tamsulosin (FLOMAX) 0.4 MG CAPS capsule Take 2 capsules (0.8 mg total) by mouth daily after supper.  ? ?No facility-administered encounter medications on file as of 10/16/2021.  ? ? ?Allergies (verified) ?Patient has no known allergies.  ? ?History: ?Past Medical History:  ?Diagnosis Date  ? Anxiety   ? Depression   ? Drug addiction ([place]   ? High cholesterol   ? Hypertensive urgency   ? Tinnitus   ? ?Past Surgical History:  ?Procedure Laterality Date  ? SKIN CANCER EXCISION  2015  ? TONSILLECTOMY    ? WISDOM TOOTH EXTRACTION    ? nausea post op  ? ?Family History  ?Problem Relation Age of Onset  ?  Breast cancer Mother   ?     Died at 38  ? Lung cancer Father   ? Prostate cancer Father   ?     Died at 50   ? Colon cancer Paternal Grandmother   ? Esophageal cancer Neg Hx   ? Rectal cancer Neg Hx   ? Stomach cancer Neg Hx   ? Colon polyps Neg Hx   ? ?Social History  ? ?Socioeconomic History  ? Marital status: Divorced  ?  Spouse name: Not on file  ? Number of children: Not on file  ? Years of education: Not on file  ? Highest education level: Not on file  ?Occupational History  ? Not on file  ?Tobacco Use  ? Smoking status: Never  ? Smokeless tobacco: Never  ?Vaping Use  ? Vaping Use: Never used  ?Substance and Sexual Activity  ?  Alcohol use: Yes  ?  Alcohol/week: 10.0 standard drinks  ?  Types: 10 Glasses of wine per week  ? Drug use: Yes  ?  Types: Other-see comments, "Crack" cocaine, Cocaine, Marijuana  ?  Comment: current marijuana use  twice a month   ? Sexual activity: Not on file  ?  Comment: oxycontin  ?Other Topics Concern  ? Not on file  ?Social History Narrative  ? Works as a Education officer, community for [employer]   ? 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   ? 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   ? ?Social Determinants of Health  ? ?Financial Resource Strain: Low Risk   ? Difficulty of Paying Living Expenses: Not hard at all  ?Food Insecurity: No Food Insecurity  ? Worried About Charity fundraiser in the Last Year: Never true  ? Ran Out of Food in the Last Year: Never true  ?Transportation Needs: No Transportation Needs  ? Lack of Transportation (Medical): No  ? Lack of Transportation (Non-Medical): No  ?Physical Activity: Sufficiently Active  ? Days of Exercise per Week: 3 days  ? Minutes of Exercise per Session: 60 min  ?Stress: No Stress Concern Present  ? Feeling of Stress : Not at all  ?Social Connections: Socially Isolated  ? Frequency of Communication with Friends and Family: More than three times a week  ? Frequency of Social Gatherings with Friends and Family: More than three times a week  ? Attends Religious Services: Never  ? Active Member of Clubs or Organizations: No  ? Attends Archivist Meetings: Never  ? Marital Status: Divorced  ? ? ?Tobacco Counseling ?Counseling given: Not Answered ? ? ?Clinical Intake: ? ?Pre-visit preparation completed: No ?Diabetic?  No ? ? ?Activities of Daily Living ? ?  10/16/2021  ? 11:43 AM  ?In your present state of health, do you have any difficulty performing the following activities:  ?Hearing? 0  ?Vision? 0  ?Difficulty concentrating or making decisions? 0  ?Walking or climbing stairs? 0  ?Dressing or bathing? 0  ?Doing errands, shopping? 0  ?Preparing Food and eating ? N  ?Using the Toilet? N  ?In the past  six months, have you accidently leaked urine? N  ?Do you have problems with loss of bowel control? N  ?Managing your Medications? N  ?Managing your Finances? N  ?Housekeeping or managing your Housekeeping? N  ? ? ?Patient Care Team: ?Dorothyann Peng, NP as PCP - General (Family Medicine) ?Pa, [facility] (Urology) ?Viona Gilmore, [facility] as Pharmacist (Pharmacist) ?Ardis Hughs, MD as Attending Physician (Urology) ?Tyler Pita, MD  as Consulting Physician (Radiation Oncology) ?Gardenia Phlegm, NP as Nurse Practitioner (Hematology and Oncology) ?Harmon Pier, RN as Registered Nurse ? ?Indicate any recent Medical Services you may have received from other than [facility] providers in the past year (date may be approximate). ? ?   ?Assessment:  ? This is a routine wellness examination for Samer. ? ?Hearing/Vision screen ?Hearing Screening - Comments:: No hearing difficulty ?Vision Screening - Comments:: Wears reading glasses.  ? ?Dietary issues and exercise activities discussed: ?Exercise limited by: None identified ? ? Goals Addressed   ? ?  ?  ?  ?  ?  ? This Visit's Progress  ?   Patient Stated (pt-stated)     ?   I want to continue working on my music and art and travel. ?  ? ?  ? ?Depression Screen ? ?  10/16/2021  ? 11:40 AM 08/05/2021  ?  9:43 AM 07/21/2021  ? 12:50 PM 07/01/2021  ?  2:06 PM 09/12/2020  ?  9:13 AM 01/31/2020  ? 10:35 AM  ?PHQ 2/9 Scores  ?PHQ - 2 Score 0 0 1 2 0 0  ?PHQ- 9 Score  0  7    ?  ?Fall Risk ? ?  10/16/2021  ? 11:44 AM 08/05/2021  ?  9:43 AM 09/12/2020  ?  9:12 AM 01/31/2020  ? 10:35 AM  ?Fall Risk   ?Falls in the past year? 0 0 0 1  ?Number falls in past yr: 0  0 0  ?Injury with Fall? 0  0 1  ?Risk for fall due to : No Fall Risks  No Fall Risks   ?Follow up   Falls evaluation completed   ? ? ?FALL RISK PREVENTION PERTAINING TO THE HOME: ? ?Any stairs in or around the home? Yes  ?If so, are there any without handrails? No  ?Home free of loose throw rugs in walkways, pet  beds, electrical cords, etc? Yes  ?Adequate lighting in your home to reduce risk of falls? Yes  ? ?ASSISTIVE DEVICES UTILIZED TO PREVENT FALLS: ? ?Life alert? No  ?Use of a cane, walker or w/c? No  ?Grab

## 2021-10-26 DIAGNOSIS — R2689 Other abnormalities of gait and mobility: Secondary | ICD-10-CM | POA: Diagnosis not present

## 2021-10-26 DIAGNOSIS — M5489 Other dorsalgia: Secondary | ICD-10-CM | POA: Diagnosis not present

## 2021-10-26 DIAGNOSIS — R202 Paresthesia of skin: Secondary | ICD-10-CM | POA: Diagnosis not present

## 2021-10-26 DIAGNOSIS — M542 Cervicalgia: Secondary | ICD-10-CM | POA: Diagnosis not present

## 2021-10-29 DIAGNOSIS — M5489 Other dorsalgia: Secondary | ICD-10-CM | POA: Diagnosis not present

## 2021-10-29 DIAGNOSIS — R2689 Other abnormalities of gait and mobility: Secondary | ICD-10-CM | POA: Diagnosis not present

## 2021-10-29 DIAGNOSIS — M542 Cervicalgia: Secondary | ICD-10-CM | POA: Diagnosis not present

## 2021-10-29 DIAGNOSIS — R202 Paresthesia of skin: Secondary | ICD-10-CM | POA: Diagnosis not present

## 2021-11-02 DIAGNOSIS — M542 Cervicalgia: Secondary | ICD-10-CM | POA: Diagnosis not present

## 2021-11-02 DIAGNOSIS — R202 Paresthesia of skin: Secondary | ICD-10-CM | POA: Diagnosis not present

## 2021-11-02 DIAGNOSIS — M5489 Other dorsalgia: Secondary | ICD-10-CM | POA: Diagnosis not present

## 2021-11-02 DIAGNOSIS — R2689 Other abnormalities of gait and mobility: Secondary | ICD-10-CM | POA: Diagnosis not present

## 2021-11-03 ENCOUNTER — Other Ambulatory Visit: Payer: Self-pay

## 2021-11-03 ENCOUNTER — Emergency Department (HOSPITAL_COMMUNITY)
Admission: EM | Admit: 2021-11-03 | Discharge: 2021-11-04 | Disposition: A | Discharge status: Home / Self Care | Payer: Medicare Other | Attending: Emergency Medicine | Admitting: Emergency Medicine

## 2021-11-03 ENCOUNTER — Encounter (HOSPITAL_COMMUNITY): Payer: Self-pay

## 2021-11-03 DIAGNOSIS — F32A Depression, unspecified: Secondary | ICD-10-CM | POA: Diagnosis not present

## 2021-11-03 DIAGNOSIS — T18128A Food in esophagus causing other injury, initial encounter: Secondary | ICD-10-CM | POA: Insufficient documentation

## 2021-11-03 DIAGNOSIS — I10 Essential (primary) hypertension: Secondary | ICD-10-CM | POA: Insufficient documentation

## 2021-11-03 DIAGNOSIS — T182XXA Foreign body in stomach, initial encounter: Secondary | ICD-10-CM | POA: Diagnosis not present

## 2021-11-03 DIAGNOSIS — K222 Esophageal obstruction: Secondary | ICD-10-CM | POA: Insufficient documentation

## 2021-11-03 DIAGNOSIS — X58XXXA Exposure to other specified factors, initial encounter: Secondary | ICD-10-CM | POA: Insufficient documentation

## 2021-11-03 DIAGNOSIS — F419 Anxiety disorder, unspecified: Secondary | ICD-10-CM | POA: Diagnosis not present

## 2021-11-03 DIAGNOSIS — R079 Chest pain, unspecified: Secondary | ICD-10-CM | POA: Diagnosis not present

## 2021-11-03 MED ORDER — ONDANSETRON HCL 4 MG/2ML IJ SOLN
4.0000 mg | Freq: Once | INTRAMUSCULAR | Status: AC
  Administered 2021-11-04: 4 mg via INTRAVENOUS
  Filled 2021-11-03: qty 2

## 2021-11-03 NOTE — ED Triage Notes (Signed)
Pt states that he has some grilled chicken stuck in his esophagus x 2 hrs. Pt states that he can normally drink water and be ok, but this time its not going down.  ?

## 2021-11-04 ENCOUNTER — Encounter: Payer: Medicare Other | Admitting: Adult Health

## 2021-11-04 ENCOUNTER — Telehealth: Payer: Self-pay

## 2021-11-04 ENCOUNTER — Ambulatory Visit (HOSPITAL_COMMUNITY): Admit: 2021-11-04 | Payer: Medicare Other | Admitting: Gastroenterology

## 2021-11-04 ENCOUNTER — Encounter (HOSPITAL_COMMUNITY): Payer: Self-pay | Admitting: Emergency Medicine

## 2021-11-04 ENCOUNTER — Encounter (HOSPITAL_COMMUNITY)
Admission: EM | Disposition: A | Discharge status: Home / Self Care | Payer: Self-pay | Source: Home / Self Care | Attending: Emergency Medicine

## 2021-11-04 ENCOUNTER — Other Ambulatory Visit: Payer: Self-pay

## 2021-11-04 ENCOUNTER — Emergency Department (HOSPITAL_COMMUNITY): Payer: Medicare Other

## 2021-11-04 ENCOUNTER — Telehealth: Payer: Self-pay | Admitting: Adult Health

## 2021-11-04 ENCOUNTER — Emergency Department (EMERGENCY_DEPARTMENT_HOSPITAL): Payer: Medicare Other | Admitting: Certified Registered Nurse Anesthetist

## 2021-11-04 ENCOUNTER — Emergency Department (HOSPITAL_COMMUNITY): Payer: Medicare Other | Admitting: Certified Registered Nurse Anesthetist

## 2021-11-04 DIAGNOSIS — I10 Essential (primary) hypertension: Secondary | ICD-10-CM | POA: Diagnosis not present

## 2021-11-04 DIAGNOSIS — K222 Esophageal obstruction: Secondary | ICD-10-CM | POA: Diagnosis not present

## 2021-11-04 DIAGNOSIS — T182XXA Foreign body in stomach, initial encounter: Secondary | ICD-10-CM | POA: Diagnosis not present

## 2021-11-04 DIAGNOSIS — T18128A Food in esophagus causing other injury, initial encounter: Secondary | ICD-10-CM

## 2021-11-04 DIAGNOSIS — W44F3XA Food entering into or through a natural orifice, initial encounter: Secondary | ICD-10-CM

## 2021-11-04 DIAGNOSIS — R079 Chest pain, unspecified: Secondary | ICD-10-CM | POA: Diagnosis not present

## 2021-11-04 HISTORY — PX: ESOPHAGOGASTRODUODENOSCOPY: SHX5428

## 2021-11-04 LAB — I-STAT CHEM 8, ED
BUN: 20 mg/dL (ref 8–23)
Calcium, Ion: 1.19 mmol/L (ref 1.15–1.40)
Chloride: 108 mmol/L (ref 98–111)
Creatinine, Ser: 1.1 mg/dL (ref 0.61–1.24)
Glucose, Bld: 114 mg/dL — ABNORMAL HIGH (ref 70–99)
HCT: 44 % (ref 39.0–52.0)
Hemoglobin: 15 g/dL (ref 13.0–17.0)
Potassium: 4 mmol/L (ref 3.5–5.1)
Sodium: 139 mmol/L (ref 135–145)
TCO2: 21 mmol/L — ABNORMAL LOW (ref 22–32)

## 2021-11-04 SURGERY — EGD (ESOPHAGOGASTRODUODENOSCOPY)
Anesthesia: General

## 2021-11-04 MED ORDER — ONDANSETRON HCL 4 MG/2ML IJ SOLN
INTRAMUSCULAR | Status: DC | PRN
  Administered 2021-11-04: 4 mg via INTRAVENOUS

## 2021-11-04 MED ORDER — SUCCINYLCHOLINE CHLORIDE 200 MG/10ML IV SOSY
PREFILLED_SYRINGE | INTRAVENOUS | Status: DC | PRN
  Administered 2021-11-04: 140 mg via INTRAVENOUS

## 2021-11-04 MED ORDER — FENTANYL CITRATE (PF) 100 MCG/2ML IJ SOLN
INTRAMUSCULAR | Status: AC
  Filled 2021-11-04: qty 2

## 2021-11-04 MED ORDER — PHENYLEPHRINE 80 MCG/ML (10ML) SYRINGE FOR IV PUSH (FOR BLOOD PRESSURE SUPPORT)
PREFILLED_SYRINGE | INTRAVENOUS | Status: DC | PRN
  Administered 2021-11-04: 80 ug via INTRAVENOUS

## 2021-11-04 MED ORDER — PROPOFOL 10 MG/ML IV BOLUS
INTRAVENOUS | Status: DC | PRN
  Administered 2021-11-04: 150 mg via INTRAVENOUS

## 2021-11-04 MED ORDER — FENTANYL CITRATE (PF) 100 MCG/2ML IJ SOLN
INTRAMUSCULAR | Status: DC | PRN
  Administered 2021-11-04: 50 ug via INTRAVENOUS

## 2021-11-04 MED ORDER — LIDOCAINE 2% (20 MG/ML) 5 ML SYRINGE
INTRAMUSCULAR | Status: DC | PRN
Start: 2021-11-04 — End: 2021-11-04
  Administered 2021-11-04: 40 mg via INTRAVENOUS

## 2021-11-04 MED ORDER — PROPOFOL 10 MG/ML IV BOLUS
INTRAVENOUS | Status: AC
  Filled 2021-11-04: qty 20

## 2021-11-04 MED ORDER — LACTATED RINGERS IV SOLN: INTRAVENOUS | Status: DC

## 2021-11-04 MED ORDER — LACTATED RINGERS IV SOLN: INTRAVENOUS | Status: DC | PRN

## 2021-11-04 MED ORDER — DEXAMETHASONE SODIUM PHOSPHATE 10 MG/ML IJ SOLN
INTRAMUSCULAR | Status: DC | PRN
  Administered 2021-11-04: 8 mg via INTRAVENOUS

## 2021-11-04 MED ORDER — GLUCAGON HCL RDNA (DIAGNOSTIC) 1 MG IJ SOLR
1.0000 mg | Freq: Once | INTRAMUSCULAR | Status: AC
  Administered 2021-11-04: 1 mg via INTRAVENOUS
  Filled 2021-11-04: qty 1

## 2021-11-04 NOTE — H&P (Signed)
?[facility] History and Physical ? ? ?Primary Care Physician:  Dorothyann Peng, NP ? ? ?Reason for Procedure:   Esophageal food impaction ? ?Plan:    Emergent EGD with disimpaction ? ? ? ? ?HPI: Frank [name] is a 71 y.o. male with a history of anxiety, depression and hypertension who presented to the emergency department this evening with esophageal food impaction.  Patient states he was eating chicken around 8 this evening when he felt it sit in his chest.  He has not been able to eat or drink anything since then.  He repeatedly has the urge to vomit and retch about every 10 minutes, but is only able to bring up copious phlegm.  He has failed multiple trials of drinking soda and administration of glucagon emergency department.  He has a history of intermittent dysphagia, but this is the first time he has had a food impaction.  He has never had an upper endoscopy.  He denies a history of frequent chronic GERD symptoms. ? ? ?Past Medical History:  ?Diagnosis Date  ? Anxiety   ? Depression   ? Drug addiction ([place])   ? High cholesterol   ? Hypertensive urgency   ? Tinnitus   ? ? ?Past Surgical History:  ?Procedure Laterality Date  ? SKIN CANCER EXCISION  2015  ? TONSILLECTOMY    ? WISDOM TOOTH EXTRACTION    ? nausea post op  ? ? ?Prior to Admission medications   ?Medication Sig Start Date End Date Taking? Authorizing Provider  ?amLODipine (NORVASC) 10 MG tablet TAKE ONE TABLET BY MOUTH DAILY 10/01/21   Nafziger, Tommi Rumps, NP  ?atorvastatin (LIPITOR) 40 MG tablet TAKE 1 TABLET BY MOUTH DAILY ?Patient not taking: Reported on 10/16/2021 12/30/20   Dorothyann Peng, NP  ?cyclobenzaprine (FLEXERIL) 10 MG tablet Take 10 mg by mouth every 8 (eight) hours as needed. 06/08/21   [provider]  ?doxycycline (VIBRAMYCIN) 100 MG capsule Take 1 capsule (100 mg total) by mouth 2 (two) times daily. 08/05/21   Nafziger, Tommi Rumps, NP  ?ibuprofen (ADVIL) 200 MG tablet Take 200 mg by mouth every 6 (six) hours as needed for fever  or mild pain.    [provider]  ?lisinopril (ZESTRIL) 40 MG tablet TAKE 1 TABLET BY MOUTH EVERY DAY 08/25/21   Nafziger, Tommi Rumps, NP  ?meloxicam (MOBIC) 15 MG tablet TAKE 1 TABLET(15 MG) BY MOUTH DAILY 07/31/21   Nafziger, Tommi Rumps, NP  ?QUEtiapine (SEROQUEL) 50 MG tablet TAKE 1 TABLET BY MOUTH AT BEDTIME 04/20/21   Nafziger, Tommi Rumps, NP  ?sildenafil (VIAGRA) 50 MG tablet Take 50 mg by mouth as needed for erectile dysfunction.    [provider]  ?tamsulosin (FLOMAX) 0.4 MG CAPS capsule Take 2 capsules (0.8 mg total) by mouth daily after supper. 08/17/21   Tyler Pita, MD  ? ? ?No current facility-administered medications for this encounter.  ? ?Current Outpatient Medications  ?Medication Sig Dispense Refill  ? amLODipine (NORVASC) 10 MG tablet TAKE ONE TABLET BY MOUTH DAILY 90 tablet 1  ? atorvastatin (LIPITOR) 40 MG tablet TAKE 1 TABLET BY MOUTH DAILY (Patient not taking: Reported on 10/16/2021) 90 tablet 3  ? cyclobenzaprine (FLEXERIL) 10 MG tablet Take 10 mg by mouth every 8 (eight) hours as needed.    ? doxycycline (VIBRAMYCIN) 100 MG capsule Take 1 capsule (100 mg total) by mouth 2 (two) times daily. 20 capsule 0  ? ibuprofen (ADVIL) 200 MG tablet Take 200 mg by mouth every 6 (six) hours as  needed for fever or mild pain.    ? lisinopril (ZESTRIL) 40 MG tablet TAKE 1 TABLET BY MOUTH EVERY DAY 90 tablet 0  ? meloxicam (MOBIC) 15 MG tablet TAKE 1 TABLET(15 MG) BY MOUTH DAILY 90 tablet 1  ? QUEtiapine (SEROQUEL) 50 MG tablet TAKE 1 TABLET BY MOUTH AT BEDTIME 90 tablet 1  ? sildenafil (VIAGRA) 50 MG tablet Take 50 mg by mouth as needed for erectile dysfunction.    ? tamsulosin (FLOMAX) 0.4 MG CAPS capsule Take 2 capsules (0.8 mg total) by mouth daily after supper. 60 capsule 5  ? ? ?Allergies as of 11/03/2021  ? (No Known Allergies)  ? ? ?Family History  ?Problem Relation Age of Onset  ? Breast cancer Mother   ?     Died at 54  ? Lung cancer Father   ? Prostate cancer Father   ?     Died at 66   ? Colon  cancer Paternal Grandmother   ? Esophageal cancer Neg Hx   ? Rectal cancer Neg Hx   ? Stomach cancer Neg Hx   ? Colon polyps Neg Hx   ? ? ?Social History  ? ?Socioeconomic History  ? Marital status: Divorced  ?  Spouse name: Not on file  ? Number of children: Not on file  ? Years of education: Not on file  ? Highest education level: Not on file  ?Occupational History  ? Not on file  ?Tobacco Use  ? Smoking status: Never  ? Smokeless tobacco: Never  ?Vaping Use  ? Vaping Use: Never used  ?Substance and Sexual Activity  ? Alcohol use: Yes  ?  Alcohol/week: 10.0 standard drinks  ?  Types: 10 Glasses of wine per week  ? Drug use: Yes  ?  Types: Other-see comments, "Crack" cocaine, Cocaine, Marijuana  ?  Comment: current marijuana use  twice a month   ? Sexual activity: Not on file  ?  Comment: oxycontin  ?Other Topics Concern  ? Not on file  ?Social History Narrative  ? Works as a Education officer, community for [employer]   ? 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   ? 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   ? ?Social Determinants of Health  ? ?Financial Resource Strain: Low Risk   ? Difficulty of Paying Living Expenses: Not hard at all  ?Food Insecurity: No Food Insecurity  ? Worried About Charity fundraiser in the Last Year: Never true  ? Ran Out of Food in the Last Year: Never true  ?Transportation Needs: No Transportation Needs  ? Lack of Transportation (Medical): No  ? Lack of Transportation (Non-Medical): No  ?Physical Activity: Sufficiently Active  ? Days of Exercise per Week: 3 days  ? Minutes of Exercise per Session: 60 min  ?Stress: No Stress Concern Present  ? Feeling of Stress : Not at all  ?Social Connections: Socially Isolated  ? Frequency of Communication with Friends and Family: More than three times a week  ? Frequency of Social Gatherings with Friends and Family: More than three times a week  ? Attends Religious Services: Never  ? Active Member of Clubs or Organizations: No  ? Attends Archivist Meetings: Never  ? Marital Status:  Divorced  ?Intimate Partner Violence: Not At Risk  ? Fear of Current or Ex-Partner: No  ? Emotionally Abused: No  ? Physically Abused: No  ? Sexually Abused: No  ? ? ?Review of Systems: ? ?All other review of systems negative except  as mentioned in the HPI. ? ?Physical Exam: ?Vital signs ?BP (!) 163/99 (BP Location: Right Arm)   Pulse 99   Temp 99.4 ?F (37.4 ?C) (Oral)   Resp 15   Ht 5' 7.5" (1.715 m)   Wt 88.5 kg   SpO2 98%   BMI 30.09 kg/m?  ? ?General:   Alert,  Well-developed, well-nourished, pleasant and cooperative in NAD.  Patient appears anxious and uncomfortable ?Airway:  Mallampati 2 ?Lungs:  Clear throughout to auscultation.   ?Heart: Tachycardic; no murmurs, clicks, rubs,  or gallops. ?Abdomen:  Soft, nontender and nondistended. Normal bowel sounds.   ?Neuro/Psych:  Normal mood and affect. A and O x 3 ? ? ?Tavita Eastham E. Candis Schatz, MD ?[facility] ? ?

## 2021-11-04 NOTE — Anesthesia Postprocedure Evaluation (Signed)
Anesthesia Post Note ? ?Patient: Frank [name] ? ?Procedure(s) Performed: ESOPHAGOGASTRODUODENOSCOPY (EGD) ? ?  ? ?Patient location during evaluation: PACU ?Anesthesia Type: General ?Level of consciousness: awake and alert ?Pain management: pain level controlled ?Vital Signs Assessment: post-procedure vital signs reviewed and stable ?Respiratory status: spontaneous breathing, nonlabored ventilation, respiratory function stable and patient connected to nasal cannula oxygen ?Cardiovascular status: blood pressure returned to baseline and stable ?Postop Assessment: no apparent nausea or vomiting ?Anesthetic complications: no ? ? ?No notable events documented. ? ?Last Vitals:  ?Vitals:  ? 11/04/21 0240 11/04/21 0252  ?BP: (!) 147/93 (!) 144/78  ?Pulse: 84 81  ?Resp: 15 19  ?Temp: 36.7 ?C   ?SpO2: 98% 100%  ?  ?Last Pain:  ?Vitals:  ? 11/04/21 0347  ?TempSrc:   ?PainSc: 0-No pain  ? ? ?  ?  ?  ?  ?  ?  ? ?Effie Berkshire ? ? ? ? ?

## 2021-11-04 NOTE — Telephone Encounter (Signed)
Called and spoke with patient to schedule his f/u appt. Pt has been scheduled for a hospital follow up with Dr. Havery Moros on Wednesday, 12/02/21 at 8:30 am. Pt is aware that this appt information will be available in his MyChart. Pt verbalized understanding and had no concerns at the end of the call. ?

## 2021-11-04 NOTE — Telephone Encounter (Signed)
Noted  

## 2021-11-04 NOTE — Anesthesia Preprocedure Evaluation (Signed)
Anesthesia Evaluation  ?Patient identified by MRN, date of birth, ID band ?Patient awake ? ? ? ?Reviewed: ?Allergy & Precautions, NPO status , Patient's Chart, lab work & pertinent test results ? ?Airway ?Mallampati: I ? ?TM Distance: >3 FB ?Neck ROM: Full ? ? ? Dental ? ?(+) Poor Dentition, Dental Advisory Given ?  ?Pulmonary ?neg pulmonary ROS,  ?  ?breath sounds clear to auscultation ? ? ? ? ? ? Cardiovascular ?hypertension, Pt. on medications ? ?Rhythm:Regular Rate:Normal ? ? ?  ?Neuro/Psych ?PSYCHIATRIC DISORDERS Anxiety Depression negative neurological ROS ?   ? GI/Hepatic ?negative GI ROS, Neg liver ROS,   ?Endo/Other  ?negative endocrine ROS ? Renal/GU ?negative Renal ROS  ? ?  ?Musculoskeletal ?negative musculoskeletal ROS ?(+)  ? Abdominal ?Normal abdominal exam  (+)   ?Peds ? Hematology ?negative hematology ROS ?(+)   ?Anesthesia Other Findings ? ? Reproductive/Obstetrics ? ?  ? ? ? ? ? ? ? ? ? ? ? ? ? ?  ?  ? ? ? ? ? ? ? ? ?Anesthesia Physical ?Anesthesia Plan ? ?ASA: 2 and emergent ? ?Anesthesia Plan: General  ? ?Post-op Pain Management:   ? ?Induction: Rapid sequence and Cricoid pressure planned ? ?PONV Risk Score and Plan: 3 and Ondansetron and Treatment may vary due to age or medical condition ? ?Airway Management Planned: Oral ETT ? ?Additional Equipment: None ? ?Intra-op Plan:  ? ?Post-operative Plan: Extubation in OR ? ?Informed Consent: I have reviewed the patients History and Physical, chart, labs and discussed the procedure including the risks, benefits and alternatives for the proposed anesthesia with the patient or authorized representative who has indicated his/her understanding and acceptance.  ? ? ? ? ? ?Plan Discussed with: CRNA ? ?Anesthesia Plan Comments:   ? ? ? ? ? ? ?Anesthesia Quick Evaluation ? ?

## 2021-11-04 NOTE — Transfer of Care (Signed)
Immediate Anesthesia Transfer of Care Note ? ?Patient: Frank [name] ? ?Procedure(s) Performed: ESOPHAGOGASTRODUODENOSCOPY (EGD) ? ?Patient Location: PACU ? ?Anesthesia Type:General ? ?Level of Consciousness: drowsy and patient cooperative ? ?Airway & Oxygen Therapy: Patient Spontanous Breathing and Patient connected to face mask oxygen ? ?Post-op Assessment: Report given to RN and Post -op Vital signs reviewed and stable ? ?Post vital signs: Reviewed and stable ? ?Last Vitals:  ?Vitals Value Taken Time  ?BP 129/89 11/04/21 0226  ?Temp    ?Pulse 88 11/04/21 0229  ?Resp 17 11/04/21 0229  ?SpO2 97 % 11/04/21 0229  ?Vitals shown include unvalidated device data. ? ?Last Pain:  ?Vitals:  ? 11/04/21 0126  ?TempSrc: Temporal  ?PainSc: 8   ?   ? ?  ? ?Complications: No notable events documented. ?

## 2021-11-04 NOTE — ED Notes (Signed)
Pt going to call an [facility] to get home ?

## 2021-11-04 NOTE — ED Provider Notes (Signed)
?[facility] DEPT ?Provider Note ? ? ?CSN: [id number] ?Arrival date & time: 11/03/21  2259 ? ?  ? ?History ? ?Chief Complaint  ?Patient presents with  ? food bolus  ? ? ?Frank [name] is a 71 y.o. male. ? ?The history is provided by the patient.  ? ?  ?Patient presents with difficulty swallowing.  He reports eating chicken about 2-3 hours ago, he does not think he went all the way down.  He tried to push it down with water but he vomited it up.  He is unable to keep any liquids down.  He has some painful swallowing.  He reports over the past year has been having some difficulty swallowing but never this severe. ? ?Home Medications ?Prior to Admission medications   ?Medication Sig Start Date End Date Taking? Authorizing Provider  ?amLODipine (NORVASC) 10 MG tablet TAKE ONE TABLET BY MOUTH DAILY 10/01/21  Yes Nafziger, Tommi Rumps, NP  ?atorvastatin (LIPITOR) 40 MG tablet TAKE 1 TABLET BY MOUTH DAILY 12/30/20  Yes Nafziger, Tommi Rumps, NP  ?cyclobenzaprine (FLEXERIL) 10 MG tablet Take 10 mg by mouth every 8 (eight) hours as needed. 06/08/21  Yes [provider]  ?ibuprofen (ADVIL) 200 MG tablet Take 200 mg by mouth every 6 (six) hours as needed for fever or mild pain.   Yes [provider]  ?lisinopril (ZESTRIL) 40 MG tablet TAKE 1 TABLET BY MOUTH EVERY DAY 08/25/21  Yes Nafziger, Tommi Rumps, NP  ?meloxicam (MOBIC) 15 MG tablet TAKE 1 TABLET(15 MG) BY MOUTH DAILY 07/31/21  Yes Nafziger, Tommi Rumps, NP  ?QUEtiapine (SEROQUEL) 50 MG tablet TAKE 1 TABLET BY MOUTH AT BEDTIME 04/20/21  Yes Nafziger, Tommi Rumps, NP  ?tamsulosin (FLOMAX) 0.4 MG CAPS capsule Take 2 capsules (0.8 mg total) by mouth daily after supper. 08/17/21  Yes Tyler Pita, MD  ?doxycycline (VIBRAMYCIN) 100 MG capsule Take 1 capsule (100 mg total) by mouth 2 (two) times daily. 08/05/21   Nafziger, Tommi Rumps, NP  ?sildenafil (VIAGRA) 50 MG tablet Take 50 mg by mouth as needed for erectile dysfunction.    [provider]  ?   ? ?Allergies     ?Patient has no known allergies.   ? ?Review of Systems   ?Review of Systems  ?Constitutional:  Negative for fever.  ?HENT:  Positive for trouble swallowing.   ?Gastrointestinal:  Positive for nausea and vomiting.  ? ?Physical Exam ?Updated Vital Signs ?BP (!) 185/104   Pulse 98   Temp (!) 97.5 ?F (36.4 ?C) (Temporal)   Resp 18   Ht 1.715 m (5' 7.5")   Wt 88.5 kg   SpO2 98%   BMI 30.11 kg/m?  ?Physical Exam ?CONSTITUTIONAL: Well developed/well nourished, uncomfortable appearing ?HEAD: Normocephalic/atraumatic ?EYES: EOMI ?ENMT: Mucous membranes moist, uvula midline, no drooling, no stridor ?NECK: supple no meningeal signs ?CV: S1/S2 noted ?LUNGS: Lungs are clear to auscultation bilaterally, no apparent distress ?ABDOMEN: soft,  ?NEURO: Pt is awake/alert/appropriate, moves all extremitiesx4.  No facial droop.   ?EXTREMITIES: full ROM ?SKIN: warm, color normal ?PSYCH: Anxious ?ED Results / Procedures / Treatments   ?Labs ?(all labs ordered are listed, but only abnormal results are displayed) ?Labs Reviewed  ?I-STAT CHEM 8, ED - Abnormal; Notable for the following components:  ?    Result Value  ? Glucose, Bld 114 (*)   ? TCO2 21 (*)   ? All other components within normal limits  ? ? ?EKG ?None ? ?Radiology ?DG Chest Port 1 View ? ?Result Date: 11/04/2021 ?CLINICAL  DATA:  Chest pain. EXAM: PORTABLE CHEST 1 VIEW COMPARISON:  Chest radiograph dated 10/17/2018. FINDINGS: No focal consolidation, pleural effusion, or pneumothorax. The cardiac silhouette is within normal limits. No acute osseous pathology. IMPRESSION: No active disease. Electronically Signed   By: Anner Crete M.D.   On: 11/04/2021 00:28   ? ?Procedures ?Procedures  ? ? ?Medications Ordered in ED ?Medications  ?lactated ringers infusion (has no administration in time range)  ?ondansetron [facility]) injection 4 mg (4 mg Intravenous Given 11/04/21 0030)  ?glucagon (human recombinant) (GLUCAGEN) injection 1 mg (1 mg Intravenous Given 11/04/21 0039)   ? ? ?ED Course/ Medical Decision Making/ A&P ?Clinical Course as of 11/04/21 0137  ?Wed Nov 04, 2021  ?0018 Discussed with Dr. Candis Schatz with [facility].  Patient has been seen by their service previously.  He will help arrange endoscopy to remove food impaction [DW]  ?  ?Clinical Course User Index ?[DW] Ripley Fraise, MD  ? ?                        ?Medical Decision Making ?Amount and/or Complexity of Data Reviewed ?Radiology: ordered. ? ?Risk ?Prescription drug management. ?Decision regarding hospitalization. ? ? ?Patient presents after swallowing a large piece of chicken and he feels that is stuck in his esophagus.  Patient had multiple episodes of vomiting here in the emergency department.  I performed a chest x-ray to ensure there is no signs of any esophageal perforation, and I personally visualized the x-ray it is negative.  Labs reviewed which revealed mild hyperglycemia ?Patient did not respond to IV medications and consisted of Zofran and glucagon for his esophageal food impaction. ?Patient was taken to endoscopy suite with gastroenterology to relieve the food bolus/impaction ? ? ? ? ? ? ? ? ?Final Clinical Impression(s) / ED Diagnoses ?Final diagnoses:  ?Food impaction of esophagus, initial encounter  ? ? ?Rx / DC Orders ?ED Discharge Orders   ? ? None  ? ?  ? ? ?  ?Ripley Fraise, MD ?11/04/21 0139 ? ?

## 2021-11-04 NOTE — Anesthesia Procedure Notes (Signed)
Procedure Name: Intubation ?Date/Time: 11/04/2021 1:44 AM ?Performed by: Montel Clock, CRNA ?Pre-anesthesia Checklist: Patient identified, Emergency Drugs available, Suction available, Patient being monitored and Timeout performed ?Patient Re-evaluated:Patient Re-evaluated prior to induction ?Oxygen Delivery Method: Circle system utilized ?Preoxygenation: Pre-oxygenation with 100% oxygen ?Induction Type: IV induction, Rapid sequence and Cricoid Pressure applied ?Laryngoscope Size: Mac and 3 ?Grade View: Grade I ?Tube type: Oral ?Tube size: 7.5 mm ?Number of attempts: 1 ?Airway Equipment and Method: Stylet ?Placement Confirmation: ETT inserted through vocal cords under direct vision, positive ETCO2 and breath sounds checked- equal and bilateral ?Secured at: 23 cm ?Tube secured with: Tape ?Dental Injury: Teeth and Oropharynx as per pre-operative assessment  ? ? ? ? ?

## 2021-11-04 NOTE — Telephone Encounter (Addendum)
Pt is calling to let cory know he missed his cpe this morning he was at er getting food that was stuck in his esophagus removed.  Pt does not want to make er fup appt ?

## 2021-11-04 NOTE — ED Provider Notes (Signed)
Patient tolerated EGD well.  He is now back to baseline.  He will take an [place] home.  He will follow-up with GI as an outpatient ?  ?Ripley Fraise, MD ?11/04/21 [phone] ? ?

## 2021-11-04 NOTE — Progress Notes (Signed)
Tx to ER 13 due to pt. Droved hospital by himself, no one with him at home. Needs stay in ER for Anesthesia protocol.  ?Alert oriented x4. VSS. ?

## 2021-11-04 NOTE — Telephone Encounter (Signed)
-----   Message from Daryel November, MD sent at 11/04/2021  2:38 AM EDT ----- ?Regarding: Outpatient follow up ?[place], ?Can you please get [name] scheduled for an outpatient visit with Dr. Havery Moros for follow up of esophageal food impaction?  I recommended he get a repeat EGD with dilation as an outpatient as well, but this can be scheduled at his clinic appointment. ? ?Thanks,  ?Dr. Loletha Grayer ? ?

## 2021-11-05 ENCOUNTER — Encounter (HOSPITAL_COMMUNITY): Payer: Self-pay | Admitting: Gastroenterology

## 2021-11-05 DIAGNOSIS — M542 Cervicalgia: Secondary | ICD-10-CM | POA: Diagnosis not present

## 2021-11-05 DIAGNOSIS — M5489 Other dorsalgia: Secondary | ICD-10-CM | POA: Diagnosis not present

## 2021-11-05 DIAGNOSIS — R2689 Other abnormalities of gait and mobility: Secondary | ICD-10-CM | POA: Diagnosis not present

## 2021-11-05 DIAGNOSIS — R202 Paresthesia of skin: Secondary | ICD-10-CM | POA: Diagnosis not present

## 2021-11-06 NOTE — Telephone Encounter (Signed)
Frank [name], Frank Raspberry, MD  Yevette Edwards, RN ?Within the next month - 2-4 weeks, if anything open? Thanks   ?Previous Messages ?  ?----- Message -----  ?From: Yevette Edwards, RN  ?Sent: 11/05/2021   8:49 AM EDT  ?To: Yetta Flock, MD  ?Subject: RE: Outpatient follow up                      ? ?Dr. Havery Moros,  ?I contacted pt yesterday. He is already scheduled for a f/u with you on Wednesday, 12/02/21 at 8:30 am. How far out would you like the repeat EGD?  ?Thanks  ?

## 2021-11-06 NOTE — Telephone Encounter (Signed)
Lm on vm for patient to return call. ? ?Pt has been scheduled for an EGD with Dr. Havery Moros on Thursday, 11/26/21 at 11:30 am. Will discuss further with patient. ?

## 2021-11-09 DIAGNOSIS — R202 Paresthesia of skin: Secondary | ICD-10-CM | POA: Diagnosis not present

## 2021-11-09 DIAGNOSIS — M5489 Other dorsalgia: Secondary | ICD-10-CM | POA: Diagnosis not present

## 2021-11-09 DIAGNOSIS — R2689 Other abnormalities of gait and mobility: Secondary | ICD-10-CM | POA: Diagnosis not present

## 2021-11-09 DIAGNOSIS — M542 Cervicalgia: Secondary | ICD-10-CM | POA: Diagnosis not present

## 2021-11-09 NOTE — Telephone Encounter (Signed)
Pt returned call. He stated that he wanted someone to give him more information on a procedure that he was scheduled for. I informed him that is why I left him a vm to return my call to discuss further. Pt reports that he does not have anyone that can take off a half a day from work to sit with him. I informed pt that we can cancel the appt and he can see you in the office as scheduled to discuss further. Pt agreed with this plan. He had no concerns at the end of the call. ? ?[place] appt cancelled ?

## 2021-11-09 NOTE — Telephone Encounter (Signed)
Okay thank you for letting me know. We will try to coordinate something when I see him in the office and discuss further. ?

## 2021-11-09 NOTE — Op Note (Signed)
[facility] ?Patient Name: Frank [name] ?Procedure Date: 11/04/2021 ?MRN: [id number] ?Attending MD: Gladstone Pih. Candis Schatz , MD ?Date of Birth: 31-Oct-1950 ?CSN: [id number] ?Age: 71 ?Admit Type: Emergency Department ?Procedure:                Upper GI endoscopy ?Indications:              Foreign body in the esophagus ?Providers:                Gladstone Pih. Candis Schatz, MD, Jaci Carrel, RN, Narda Rutherford  ?                          [place], Technician, Cathe Mons, CRNA ?Referring MD:              ?Medicines:                General Anesthesia ?Complications:            No immediate complications. ?Estimated Blood Loss:     Estimated blood loss: none. ?Procedure:                Pre-Anesthesia Assessment: ?                          - Prior to the procedure, a History and Physical  ?                          was performed, and patient medications and  ?                          allergies were reviewed. The patient's tolerance of  ?                          previous anesthesia was also reviewed. The risks  ?                          and benefits of the procedure and the sedation  ?                          options and risks were discussed with the patient.  ?                          All questions were answered, and informed consent  ?                          was obtained. Prior Anticoagulants: The patient has  ?                          taken no previous anticoagulant or antiplatelet  ?                          agents. ASA Grade Assessment: E - Emergency. After  ?                          reviewing the risks and benefits, the patient was  ?  deemed in satisfactory condition to undergo the  ?                          procedure. ?                          After obtaining informed consent, the endoscope was  ?                          passed under direct vision. Throughout the  ?                          procedure, the patient's blood pressure, pulse, and  ?                          oxygen  saturations were monitored continuously. The  ?                          [id number]-[id number] ([id number]) Olympus endoscope was introduced  ?                          through the mouth, and advanced to the second part  ?                          of duodenum. The upper GI endoscopy was  ?                          accomplished without difficulty. The patient  ?                          tolerated the procedure well. ?Scope In: ?Scope Out: ?Findings: ?     The examined portions of the nasopharynx, oropharynx and larynx were  ?     normal. ?     Food was found in the lower third of the esophagus. Removal was  ?     accomplished with a Raptor grasping device and Roth net. Estimated blood  ?     loss: none. ?     One benign-appearing, intrinsic moderate stenosis was found 39 to 40 cm  ?     from the incisors. This stenosis measured 1.1 cm (inner diameter) x 1 cm  ?     (in length). The stenosis was traversed. This was not dilated due to  ?     acute inflammation ?     The exam of the esophagus was otherwise normal. ?     Localized mild inflammation characterized by erythema was found at the  ?     gastroesophageal junction. ?     A medium amount of food (residue) was found in the gastric fundus. ?     The exam of the stomach was otherwise normal. ?     The examined duodenum was normal. ?Impression:               - The examined portions of the nasopharynx,  ?                          oropharynx and larynx were normal. ?                          -  Food was found in the esophagus. Removal was  ?                          successful. ?                          - Benign-appearing esophageal stenosis, likely  ?                          secondary to acid reflux. ?                          - A medium amount of food (residue) in the stomach. ?                          - Normal examined duodenum. ?Moderate Sedation: ?     Not Applicable - Patient had care per Anesthesia. ?Recommendation:           - Return patient to Emergency Department for  ?                           possible discharge same day. Will need escort ?                          - Full liquid diet today. Okay to advance diet to  ?                          regular tomorrow. Would avoid chewy meats/breads,  ?                          and chew food very slowly and carefully. ?                          - Use Prilosec (omeprazole) 40 mg PO daily for 8  ?                          weeks, then consider 20 mg PO daily indefinitely  ?                          given likely silent reflux causing peptic stricture. ?                          - Repeat upper endoscopy at appointment to be  ?                          scheduled for dilation. ?                          - Return to GI office with Dr. Havery Moros at  ?                          appointment to be scheduled. ?Procedure Code(s):        --- Professional --- ?  [id number], Esophagogastroduodenoscopy, flexible,  ?                          transoral; with removal of foreign body(s) ?Diagnosis Code(s):        --- Professional --- ?                          Q22.979G, Food in esophagus causing other injury,  ?                          initial encounter ?                          K22.2, Esophageal obstruction ?                          T18.2XXA, Foreign body in stomach, initial encounter ?                          T18.108A, Unspecified foreign body in esophagus  ?                          causing other injury, initial encounter ?CPT copyright 2019 American Medical Association. All rights reserved. ?The codes documented in this report are preliminary and upon coder review may  ?be revised to meet current compliance requirements. ?Arris Meyn E. Candis Schatz, MD ?11/04/2021 2:26:23 AM ?This report has been signed electronically. ?Number of Addenda: 0 ?

## 2021-11-12 DIAGNOSIS — M5489 Other dorsalgia: Secondary | ICD-10-CM | POA: Diagnosis not present

## 2021-11-12 DIAGNOSIS — R2689 Other abnormalities of gait and mobility: Secondary | ICD-10-CM | POA: Diagnosis not present

## 2021-11-12 DIAGNOSIS — R202 Paresthesia of skin: Secondary | ICD-10-CM | POA: Diagnosis not present

## 2021-11-12 DIAGNOSIS — M542 Cervicalgia: Secondary | ICD-10-CM | POA: Diagnosis not present

## 2021-11-15 DIAGNOSIS — H5319 Other subjective visual disturbances: Secondary | ICD-10-CM | POA: Diagnosis not present

## 2021-11-16 DIAGNOSIS — M5489 Other dorsalgia: Secondary | ICD-10-CM | POA: Diagnosis not present

## 2021-11-16 DIAGNOSIS — R202 Paresthesia of skin: Secondary | ICD-10-CM | POA: Diagnosis not present

## 2021-11-16 DIAGNOSIS — R2689 Other abnormalities of gait and mobility: Secondary | ICD-10-CM | POA: Diagnosis not present

## 2021-11-16 DIAGNOSIS — M542 Cervicalgia: Secondary | ICD-10-CM | POA: Diagnosis not present

## 2021-11-24 DIAGNOSIS — M5489 Other dorsalgia: Secondary | ICD-10-CM | POA: Diagnosis not present

## 2021-11-24 DIAGNOSIS — R2689 Other abnormalities of gait and mobility: Secondary | ICD-10-CM | POA: Diagnosis not present

## 2021-11-24 DIAGNOSIS — R202 Paresthesia of skin: Secondary | ICD-10-CM | POA: Diagnosis not present

## 2021-11-24 DIAGNOSIS — M542 Cervicalgia: Secondary | ICD-10-CM | POA: Diagnosis not present

## 2021-11-25 ENCOUNTER — Encounter: Payer: Medicare Other | Admitting: Adult Health

## 2021-11-26 ENCOUNTER — Encounter: Payer: Medicare Other | Admitting: Gastroenterology

## 2021-11-27 ENCOUNTER — Encounter: Payer: Self-pay | Admitting: Adult Health

## 2021-11-27 ENCOUNTER — Ambulatory Visit (INDEPENDENT_AMBULATORY_CARE_PROVIDER_SITE_OTHER): Payer: Medicare Other | Admitting: Adult Health

## 2021-11-27 VITALS — BP 128/70 | HR 88 | Temp 98.5°F | Ht 68.5 in | Wt 197.0 lb

## 2021-11-27 DIAGNOSIS — I10 Essential (primary) hypertension: Secondary | ICD-10-CM | POA: Diagnosis not present

## 2021-11-27 DIAGNOSIS — M549 Dorsalgia, unspecified: Secondary | ICD-10-CM | POA: Diagnosis not present

## 2021-11-27 DIAGNOSIS — E782 Mixed hyperlipidemia: Secondary | ICD-10-CM | POA: Diagnosis not present

## 2021-11-27 DIAGNOSIS — Z0000 Encounter for general adult medical examination without abnormal findings: Secondary | ICD-10-CM | POA: Diagnosis not present

## 2021-11-27 DIAGNOSIS — G8929 Other chronic pain: Secondary | ICD-10-CM | POA: Diagnosis not present

## 2021-11-27 DIAGNOSIS — F5101 Primary insomnia: Secondary | ICD-10-CM | POA: Diagnosis not present

## 2021-11-27 DIAGNOSIS — M5416 Radiculopathy, lumbar region: Secondary | ICD-10-CM

## 2021-11-27 DIAGNOSIS — Z125 Encounter for screening for malignant neoplasm of prostate: Secondary | ICD-10-CM

## 2021-11-27 LAB — COMPREHENSIVE METABOLIC PANEL
ALT: 28 U/L (ref 0–53)
AST: 19 U/L (ref 0–37)
Albumin: 4.4 g/dL (ref 3.5–5.2)
Alkaline Phosphatase: 72 U/L (ref 39–117)
BUN: 17 mg/dL (ref 6–23)
CO2: 25 mEq/L (ref 19–32)
Calcium: 9.5 mg/dL (ref 8.4–10.5)
Chloride: 100 mEq/L (ref 96–112)
Creatinine, Ser: 0.84 mg/dL (ref 0.40–1.50)
GFR: 89.58 mL/min (ref >60.00)
Glucose, Bld: 112 mg/dL — ABNORMAL HIGH (ref 70–99)
Potassium: 3.9 mEq/L (ref 3.5–5.1)
Sodium: 134 mEq/L — ABNORMAL LOW (ref 135–145)
Total Bilirubin: 0.6 mg/dL (ref 0.2–1.2)
Total Protein: 6.8 g/dL (ref 6.0–8.3)

## 2021-11-27 LAB — CBC WITH DIFFERENTIAL/PLATELET
Basophils Absolute: 0.1 10*3/uL (ref 0.0–0.1)
Basophils Relative: 1.1 % (ref 0.0–3.0)
Eosinophils Absolute: 0.2 10*3/uL (ref 0.0–0.7)
Eosinophils Relative: 2.6 % (ref 0.0–5.0)
HCT: 43 % (ref 39.0–52.0)
Hemoglobin: 14.8 g/dL (ref 13.0–17.0)
Lymphocytes Relative: 15.4 % (ref 12.0–46.0)
Lymphs Abs: 1 10*3/uL (ref 0.7–4.0)
MCHC: 34.5 g/dL (ref 30.0–36.0)
MCV: 85.9 fl (ref 78.0–100.0)
Monocytes Absolute: 0.6 10*3/uL (ref 0.1–1.0)
Monocytes Relative: 9.7 % (ref 3.0–12.0)
Neutro Abs: 4.5 10*3/uL (ref 1.4–7.7)
Neutrophils Relative %: 71.2 % (ref 43.0–77.0)
Platelets: 229 10*3/uL (ref 150.0–400.0)
RBC: 5.01 Mil/uL (ref 4.22–5.81)
RDW: 13.7 % (ref 11.5–15.5)
WBC: 6.3 10*3/uL (ref 4.0–10.5)

## 2021-11-27 LAB — HEMOGLOBIN A1C: Hgb A1c MFr Bld: 5.8 % (ref 4.6–6.5)

## 2021-11-27 LAB — TSH: TSH: 1.16 u[IU]/mL (ref 0.35–5.50)

## 2021-11-27 LAB — LIPID PANEL
Cholesterol: 344 mg/dL — ABNORMAL HIGH (ref 0–200)
HDL: 53.1 mg/dL (ref >39.00)
NonHDL: 290.73
Total CHOL/HDL Ratio: 6
Triglycerides: 202 mg/dL — ABNORMAL HIGH (ref 0.0–149.0)
VLDL: 40.4 mg/dL — ABNORMAL HIGH (ref 0.0–40.0)

## 2021-11-27 LAB — PSA: PSA: 0.74 ng/mL (ref 0.10–4.00)

## 2021-11-27 LAB — LDL CHOLESTEROL, DIRECT: Direct LDL: 247 mg/dL

## 2021-11-27 NOTE — Progress Notes (Signed)
Subjective:    Patient ID: Frank [name], male    DOB: 02-Mar-1951, 71 y.o.   MRN: [id number]  HPI Patient presents for yearly preventative medicine examination. He is a 71 year old male who  has a past medical history of Anxiety, Depression, Drug addiction ([place]), High cholesterol, Hypertensive urgency, and Tinnitus.  Hypertension -managed with Norvasc 10 mg and lisinopril 40 mg daily.  He denies dizziness, lightheadedness, chest pain, shortness of breath BP Readings from Last 3 Encounters:  11/27/21 128/70  11/04/21 (!) 144/78  09/02/21 122/88   BPH - managed by urology.  Currently prescribed Flomax 0.4 mg daily  Hyperlipidemia-takes Lipitor 40 mg daily. He stopped taking lipitor on his own to see if his chronic muscle pain would get better. Pain did not improve but he did not restart lipitor.  Lab Results  Component Value Date   CHOL 212 (H) 07/02/2020   HDL 44.00 07/02/2020   LDLCALC 132 (H) 07/02/2020   LDLDIRECT 187.0 10/30/2018   TRIG 183.0 (H) 07/02/2020   CHOLHDL 5 07/02/2020   Chronic Back Pain -patient has been seen by neurosurgery in the past and has had multiple epidural injections done in his cervical spine.  He feels as though they did not help him and they were done and may have actually made things worse.  He is currently going to physical therapy twice a week rather doing TENS unit, cupping, dry needling, and stretching.  He is not sure how well this is working any longer to help with his chronic back pain.  Most recently he started developed numbness and tingling in both lower extremities, worse on the right side.  Sensation seems to be pretty constant but worse at certain times. He denies issues with bowel or bladder  He has had an MRI of the thoracic and cervical spine in January 2023 which has shown multilevel spondylosis and hypertrophy.  ED- uses viagra as needed  Depression/Insomnia - takes Seroquel 50 mg QHS. He feels as though his symptoms are well  controlled.   All immunizations and health maintenance protocols were reviewed with the patient and needed orders were placed.  Appropriate screening laboratory values were ordered for the patient including screening of hyperlipidemia, renal function and hepatic function. If indicated by BPH, a PSA was ordered.  Medication reconciliation,  past medical history, social history, problem list and allergies were reviewed in detail with the patient  Goals were established with regard to weight loss, exercise, and  diet in compliance with medications BP Readings from Last 3 Encounters:  11/27/21 128/70  11/04/21 (!) 144/78  09/02/21 122/88   Review of Systems  Constitutional: Negative.   HENT: Negative.    Eyes: Negative.   Respiratory: Negative.    Cardiovascular: Negative.   Gastrointestinal: Negative.   Endocrine: Negative.   Genitourinary: Negative.   Musculoskeletal:  Positive for arthralgias and back pain.  Skin: Negative.   Allergic/Immunologic: Negative.   Neurological:  Positive for numbness.  Hematological: Negative.   Psychiatric/Behavioral: Negative.    All other systems reviewed and are negative.  Past Medical History:  Diagnosis Date   Anxiety    Depression    Drug addiction ([place])    High cholesterol    Hypertensive urgency    Tinnitus     Social History   Socioeconomic History   Marital status: Divorced    Spouse name: Not on file   Number of children: Not on file   Years of education: Not on file  Highest education level: Not on file  Occupational History   Not on file  Tobacco Use   Smoking status: Never   Smokeless tobacco: Never  Vaping Use   Vaping Use: Never used  Substance and Sexual Activity   Alcohol use: Yes    Alcohol/week: 10.0 standard drinks    Types: 10 Glasses of wine per week   Drug use: Yes    Types: Other-see comments, "Crack" cocaine, Cocaine, Marijuana    Comment: current marijuana use  twice a month    Sexual activity:  Not on file    Comment: oxycontin  Other Topics Concern   Not on file  Social History Narrative   Works as a Education officer, 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    Social Determinants of Health   Financial Resource Strain: Low Risk    Difficulty of Paying Living Expenses: Not hard at all  Food Insecurity: No Food Insecurity   Worried About Charity fundraiser in the Last Year: Never true   Arboriculturist in the Last Year: Never true  Transportation Needs: No Transportation Needs   Lack of Transportation (Medical): No   Lack of Transportation (Non-Medical): No  Physical Activity: Sufficiently Active   Days of Exercise per Week: 3 days   Minutes of Exercise per Session: 60 min  Stress: No Stress Concern Present   Feeling of Stress : Not at all  Social Connections: Socially Isolated   Frequency of Communication with Friends and Family: More than three times a week   Frequency of Social Gatherings with Friends and Family: More than three times a week   Attends Religious Services: Never   Marine scientist or Organizations: No   Attends Archivist Meetings: Never   Marital Status: Divorced  Human resources officer Violence: Not At Risk   Fear of Current or Ex-Partner: No   Emotionally Abused: No   Physically Abused: No   Sexually Abused: No    Past Surgical History:  Procedure Laterality Date   ESOPHAGOGASTRODUODENOSCOPY N/A 11/04/2021   Procedure: ESOPHAGOGASTRODUODENOSCOPY (EGD);  Surgeon: Daryel November, MD;  Location: [facility];  Service: Gastroenterology;  Laterality: N/A;   SKIN CANCER EXCISION  2015   TONSILLECTOMY     WISDOM TOOTH EXTRACTION     nausea post op    Family History  Problem Relation Age of Onset   Breast cancer Mother        Died at 54   Lung cancer Father    Prostate cancer Father        Died at 53    Colon cancer Paternal Grandmother    Esophageal cancer Neg Hx    Rectal cancer Neg Hx    Stomach  cancer Neg Hx    Colon polyps Neg Hx     No Known Allergies  Current Outpatient Medications on File Prior to Visit  Medication Sig Dispense Refill   amLODipine (NORVASC) 10 MG tablet TAKE ONE TABLET BY MOUTH DAILY 90 tablet 1   atorvastatin (LIPITOR) 40 MG tablet TAKE 1 TABLET BY MOUTH DAILY 90 tablet 3   ibuprofen (ADVIL) 200 MG tablet Take 200 mg by mouth every 6 (six) hours as needed for fever or mild pain.     lisinopril (ZESTRIL) 40 MG tablet TAKE 1 TABLET BY MOUTH EVERY DAY 90 tablet 0   meloxicam (MOBIC) 15 MG tablet TAKE 1 TABLET(15 MG) BY  MOUTH DAILY 90 tablet 1   QUEtiapine (SEROQUEL) 50 MG tablet TAKE 1 TABLET BY MOUTH AT BEDTIME 90 tablet 1   sildenafil (VIAGRA) 50 MG tablet Take 50 mg by mouth as needed for erectile dysfunction.     tamsulosin (FLOMAX) 0.4 MG CAPS capsule Take 2 capsules (0.8 mg total) by mouth daily after supper. 60 capsule 5   No current facility-administered medications on file prior to visit.    BP 128/70   Pulse 88   Temp 98.5 F (36.9 C) (Oral)   Ht 5' 8.5" (1.74 m)   Wt 197 lb (89.4 kg)   SpO2 95%   BMI 29.52 kg/m       Objective:   Physical Exam Vitals and nursing note reviewed.  Constitutional:      General: He is not in acute distress.    Appearance: Normal appearance. He is well-developed and normal weight.  HENT:     Head: Normocephalic and atraumatic.     Right Ear: Tympanic membrane, ear canal and external ear normal. There is no impacted cerumen.     Left Ear: Tympanic membrane, ear canal and external ear normal. There is no impacted cerumen.     Nose: Nose normal. No congestion or rhinorrhea.     Mouth/Throat:     Mouth: Mucous membranes are moist.     Dentition: Abnormal dentition.     Pharynx: Oropharynx is clear. No oropharyngeal exudate or posterior oropharyngeal erythema.  Eyes:     General:        Right eye: No discharge.        Left eye: No discharge.     Extraocular Movements: Extraocular movements intact.      Conjunctiva/sclera: Conjunctivae normal.     Pupils: Pupils are equal, round, and reactive to light.  Neck:     Vascular: No carotid bruit.     Trachea: No tracheal deviation.  Cardiovascular:     Rate and Rhythm: Normal rate and regular rhythm.     Pulses: Normal pulses.     Heart sounds: Normal heart sounds. No murmur heard.   No friction rub. No gallop.  Pulmonary:     Effort: Pulmonary effort is normal. No respiratory distress.     Breath sounds: Normal breath sounds. No stridor. No wheezing, rhonchi or rales.  Chest:     Chest wall: No tenderness.  Abdominal:     General: Bowel sounds are normal. There is no distension.     Palpations: Abdomen is soft. There is no mass.     Tenderness: There is no abdominal tenderness. There is no right CVA tenderness, left CVA tenderness, guarding or rebound.     Hernia: No hernia is present.  Musculoskeletal:        General: No swelling, tenderness, deformity or signs of injury. Normal range of motion.     Right lower leg: No edema.     Left lower leg: No edema.  Lymphadenopathy:     Cervical: No cervical adenopathy.  Skin:    General: Skin is warm and dry.     Capillary Refill: Capillary refill takes less than 2 seconds.     Coloration: Skin is not jaundiced or pale.     Findings: No bruising, erythema, lesion or rash.  Neurological:     General: No focal deficit present.     Mental Status: He is alert and oriented to person, place, and time.     Cranial Nerves: No cranial nerve deficit.  Sensory: Sensation is intact. No sensory deficit.     Motor: Motor function is intact. No weakness.     Coordination: Coordination is intact. Coordination normal.     Gait: Gait is intact. Gait normal.     Deep Tendon Reflexes: Reflexes are normal and symmetric. Reflexes normal.  Psychiatric:        Mood and Affect: Mood normal.        Behavior: Behavior normal.        Thought Content: Thought content normal.        Judgment: Judgment normal.       Assessment & Plan:  1. Routine general medical examination at a health care facility - Follow up in one year or sooner if needed - CBC with Differential/Platelet; Future - Comprehensive metabolic panel; Future - Hemoglobin A1c; Future - Lipid panel; Future - TSH; Future - TSH - Lipid panel - Hemoglobin A1c - Comprehensive metabolic panel - CBC with Differential/Platelet  2. Mixed hyperlipidemia - needs to go back on statin  - CBC with Differential/Platelet; Future - Comprehensive metabolic panel; Future - Hemoglobin A1c; Future - Lipid panel; Future - TSH; Future - TSH - Lipid panel - Hemoglobin A1c - Comprehensive metabolic panel - CBC with Differential/Platelet  3. Essential hypertension - Well controlled. No change in medication  - CBC with Differential/Platelet; Future - Comprehensive metabolic panel; Future - Hemoglobin A1c; Future - Lipid panel; Future - TSH; Future - TSH - Lipid panel - Hemoglobin A1c - Comprehensive metabolic panel - CBC with Differential/Platelet  4. Prostate cancer screening  - PSA; Future - PSA  5. Primary insomnia - Continue Seroquel  - CBC with Differential/Platelet; Future - Comprehensive metabolic panel; Future - Hemoglobin A1c; Future - Lipid panel; Future - TSH; Future - TSH - Lipid panel - Hemoglobin A1c - Comprehensive metabolic panel - CBC with Differential/Platelet  6. Other chronic back pain  - MR Lumbar Spine Wo Contrast; Future - Consider referral back to neurosurgery  7. Lumbar radiculopathy  - MR Lumbar Spine Wo Contrast; Future - Consider adding Gabapentin   Dorothyann Peng, NP

## 2021-11-30 ENCOUNTER — Other Ambulatory Visit: Payer: Self-pay | Admitting: Adult Health

## 2021-11-30 DIAGNOSIS — R2689 Other abnormalities of gait and mobility: Secondary | ICD-10-CM | POA: Diagnosis not present

## 2021-11-30 DIAGNOSIS — M542 Cervicalgia: Secondary | ICD-10-CM | POA: Diagnosis not present

## 2021-11-30 DIAGNOSIS — I10 Essential (primary) hypertension: Secondary | ICD-10-CM

## 2021-11-30 DIAGNOSIS — M5489 Other dorsalgia: Secondary | ICD-10-CM | POA: Diagnosis not present

## 2021-11-30 DIAGNOSIS — R202 Paresthesia of skin: Secondary | ICD-10-CM | POA: Diagnosis not present

## 2021-12-01 ENCOUNTER — Other Ambulatory Visit: Payer: Self-pay | Admitting: Adult Health

## 2021-12-01 ENCOUNTER — Telehealth: Payer: Self-pay | Admitting: Adult Health

## 2021-12-01 MED ORDER — GABAPENTIN 300 MG PO CAPS: 300.0000 mg | ORAL_CAPSULE | Freq: Two times a day (BID) | ORAL | 0 refills | Status: DC

## 2021-12-01 NOTE — Telephone Encounter (Signed)
Pt is calling back and would like blood work results 

## 2021-12-02 ENCOUNTER — Ambulatory Visit: Payer: Medicare Other | Admitting: Gastroenterology

## 2021-12-02 NOTE — Telephone Encounter (Signed)
Patient returning call, unsure of who called him

## 2021-12-03 DIAGNOSIS — M5489 Other dorsalgia: Secondary | ICD-10-CM | POA: Diagnosis not present

## 2021-12-03 DIAGNOSIS — R2689 Other abnormalities of gait and mobility: Secondary | ICD-10-CM | POA: Diagnosis not present

## 2021-12-03 DIAGNOSIS — M542 Cervicalgia: Secondary | ICD-10-CM | POA: Diagnosis not present

## 2021-12-03 DIAGNOSIS — R202 Paresthesia of skin: Secondary | ICD-10-CM | POA: Diagnosis not present

## 2021-12-09 ENCOUNTER — Telehealth: Payer: Self-pay | Admitting: Adult Health

## 2021-12-09 NOTE — Telephone Encounter (Signed)
Pt requesting to increase his QUEtiapine (SEROQUEL) 50 MG tablet  , he is experiencing insomnia

## 2021-12-10 ENCOUNTER — Telehealth (INDEPENDENT_AMBULATORY_CARE_PROVIDER_SITE_OTHER): Payer: Medicare Other | Admitting: Adult Health

## 2021-12-10 ENCOUNTER — Encounter: Payer: Self-pay | Admitting: Adult Health

## 2021-12-10 ENCOUNTER — Other Ambulatory Visit: Payer: Self-pay

## 2021-12-10 DIAGNOSIS — F5101 Primary insomnia: Secondary | ICD-10-CM

## 2021-12-10 DIAGNOSIS — R2689 Other abnormalities of gait and mobility: Secondary | ICD-10-CM | POA: Diagnosis not present

## 2021-12-10 DIAGNOSIS — M5489 Other dorsalgia: Secondary | ICD-10-CM | POA: Diagnosis not present

## 2021-12-10 DIAGNOSIS — M542 Cervicalgia: Secondary | ICD-10-CM | POA: Diagnosis not present

## 2021-12-10 DIAGNOSIS — R202 Paresthesia of skin: Secondary | ICD-10-CM | POA: Diagnosis not present

## 2021-12-10 MED ORDER — QUETIAPINE FUMARATE 100 MG PO TABS: 100.0000 mg | ORAL_TABLET | Freq: Every day | ORAL | 1 refills | Status: DC

## 2021-12-10 MED ORDER — QUETIAPINE FUMARATE 50 MG PO TABS: 50.0000 mg | ORAL_TABLET | Freq: Every day | ORAL | 1 refills | Status: DC

## 2021-12-10 NOTE — Progress Notes (Signed)
Virtual Visit via Telephone Note  I connected with Frank [name] on 12/10/21 at  4:00 PM EDT by telephone and verified that I am speaking with the correct person using two identifiers.   I discussed the limitations, risks, security and privacy concerns of performing an evaluation and management service by telephone and the availability of in person appointments. I also discussed with the patient that there may be a patient responsible charge related to this service. The patient expressed understanding and agreed to proceed.  Location patient: home Location provider: work or home office Participants present for the call: patient, provider Patient did not have a visit in the prior 7 days to address this/these issue(s).   History of Present Illness: 71 year old male who  has a past medical history of Anxiety, Depression, Drug addiction ([place]), High cholesterol, Hypertensive urgency, and Tinnitus.  He has been on Seroquel 50 mg for quite some time for insomnia.  This has worked well but over the last week or so he has been struggling with not being able to sleep.  May be getting 3-5 hours of sleep at night.  He is asking for an increase in his Seroquel dose so that he can get more sleep.   Observations/Objective: Patient sounds cheerful and well on the phone. I do not appreciate any SOB. Speech and thought processing are grossly intact. Patient reported vitals:  Assessment and Plan: 1. Primary insomnia  - QUEtiapine (SEROQUEL) 100 MG tablet; Take 1 tablet (100 mg total) by mouth at bedtime.  Dispense: 90 tablet; Refill: 1   Follow Up Instructions:   I did not refer this patient for an OV in the next 24 hours for this/these issue(s).  I discussed the assessment and treatment plan with the patient. The patient was provided an opportunity to ask questions and all were answered. The patient agreed with the plan and demonstrated an understanding of the instructions.   The patient was advised  to call back or seek an in-person evaluation if the symptoms worsen or if the condition fails to improve as anticipated.  I provided 10 minutes of non-face-to-face time during this encounter.   Dorothyann Peng, NP

## 2021-12-10 NOTE — Telephone Encounter (Signed)
Pt has been scheduled for a VV.  

## 2021-12-10 NOTE — Telephone Encounter (Signed)
Rx has been sent to the pharmacy

## 2021-12-10 NOTE — Telephone Encounter (Signed)
Pt is calling back because he have not got a answer and want someone to give him a call back today he stated it have been 24 hours .

## 2021-12-14 DIAGNOSIS — R202 Paresthesia of skin: Secondary | ICD-10-CM | POA: Diagnosis not present

## 2021-12-14 DIAGNOSIS — M542 Cervicalgia: Secondary | ICD-10-CM | POA: Diagnosis not present

## 2021-12-14 DIAGNOSIS — M5489 Other dorsalgia: Secondary | ICD-10-CM | POA: Diagnosis not present

## 2021-12-14 DIAGNOSIS — R2689 Other abnormalities of gait and mobility: Secondary | ICD-10-CM | POA: Diagnosis not present

## 2021-12-21 ENCOUNTER — Ambulatory Visit
Admission: RE | Admit: 2021-12-21 | Discharge: 2021-12-21 | Disposition: A | Discharge status: Home / Self Care | Payer: Medicare Other | Source: Ambulatory Visit | Attending: Adult Health | Admitting: Adult Health

## 2021-12-21 DIAGNOSIS — M545 Low back pain, unspecified: Secondary | ICD-10-CM | POA: Diagnosis not present

## 2021-12-21 DIAGNOSIS — G8929 Other chronic pain: Secondary | ICD-10-CM

## 2021-12-21 DIAGNOSIS — M48061 Spinal stenosis, lumbar region without neurogenic claudication: Secondary | ICD-10-CM | POA: Diagnosis not present

## 2021-12-21 DIAGNOSIS — M4316 Spondylolisthesis, lumbar region: Secondary | ICD-10-CM | POA: Diagnosis not present

## 2021-12-21 DIAGNOSIS — M5416 Radiculopathy, lumbar region: Secondary | ICD-10-CM

## 2022-01-04 LAB — PSA: PSA: 0.99

## 2022-01-05 ENCOUNTER — Ambulatory Visit: Payer: Medicare Other | Admitting: Adult Health

## 2022-01-07 DIAGNOSIS — R3914 Feeling of incomplete bladder emptying: Secondary | ICD-10-CM | POA: Diagnosis not present

## 2022-01-08 ENCOUNTER — Encounter: Payer: Self-pay | Admitting: Adult Health

## 2022-01-15 ENCOUNTER — Other Ambulatory Visit: Payer: Self-pay

## 2022-01-15 ENCOUNTER — Telehealth: Payer: Self-pay | Admitting: Adult Health

## 2022-01-15 ENCOUNTER — Telehealth: Payer: Self-pay

## 2022-01-15 DIAGNOSIS — M546 Pain in thoracic spine: Secondary | ICD-10-CM

## 2022-01-15 DIAGNOSIS — M542 Cervicalgia: Secondary | ICD-10-CM

## 2022-01-15 MED ORDER — METHYLPREDNISOLONE 4 MG PO TBPK: ORAL_TABLET | ORAL | 0 refills | Status: DC

## 2022-01-15 NOTE — Telephone Encounter (Signed)
Entered in error

## 2022-01-15 NOTE — Telephone Encounter (Signed)
[facility] and was transferred to the referral department. However, it directed me to a vm. Left vm for a return call to check on the status of the referral.

## 2022-01-15 NOTE — Telephone Encounter (Signed)
Rx has been filled pt notified of update.

## 2022-01-15 NOTE — Telephone Encounter (Signed)
Pt called to ask if it was possible for NP to call in a prescription for prednisone for his back pain, or does he have to come in again?  Last OV:  11/27/21 (CPE)  Please advise.  [id number] [address], [place] [address] Phone:  [phone]  Fax:  [phone]

## 2022-01-15 NOTE — Telephone Encounter (Signed)
Spoke to pt and he stated he had a MRI done and stated that he was suppose to have a referral for surgery. Pt stated that he called [facility] and they stated that they had no record of a referral for pt.

## 2022-01-19 NOTE — Telephone Encounter (Signed)
[  Yesterday 9:15 AM] Cathren Harsh  Good morning! Nevin Bloodgood from [facility] is on the line returning a call   [Yesterday 9:17 AM] Cathren Harsh  she can be reached at  [phone]  says you can leave patient's name and DOB

## 2022-01-19 NOTE — Telephone Encounter (Signed)
Tried to call the office but they do not open until 9am. Will try again later.

## 2022-01-19 NOTE — Telephone Encounter (Signed)
Left detailed message for a return call

## 2022-01-20 ENCOUNTER — Telehealth: Payer: Self-pay | Admitting: Adult Health

## 2022-01-20 DIAGNOSIS — M47812 Spondylosis without myelopathy or radiculopathy, cervical region: Secondary | ICD-10-CM | POA: Diagnosis not present

## 2022-01-20 DIAGNOSIS — M549 Dorsalgia, unspecified: Secondary | ICD-10-CM | POA: Diagnosis not present

## 2022-01-20 NOTE — Telephone Encounter (Signed)
Frank [name] @ [facility] called to say:  Pt being seen at [facility] office today @ 10:45 am  Please call if further info needed  [phone] ext [phone]

## 2022-01-20 NOTE — Telephone Encounter (Signed)
Noted  

## 2022-01-26 ENCOUNTER — Ambulatory Visit: Payer: Medicare Other | Admitting: Adult Health

## 2022-02-09 ENCOUNTER — Ambulatory Visit (INDEPENDENT_AMBULATORY_CARE_PROVIDER_SITE_OTHER): Payer: Medicare Other | Admitting: Adult Health

## 2022-02-09 ENCOUNTER — Encounter: Payer: Self-pay | Admitting: Adult Health

## 2022-02-09 VITALS — BP 140/90 | HR 88 | Temp 98.8°F | Ht 68.5 in | Wt 187.0 lb

## 2022-02-09 DIAGNOSIS — F5101 Primary insomnia: Secondary | ICD-10-CM

## 2022-02-09 DIAGNOSIS — M546 Pain in thoracic spine: Secondary | ICD-10-CM

## 2022-02-09 DIAGNOSIS — M542 Cervicalgia: Secondary | ICD-10-CM

## 2022-02-09 MED ORDER — QUETIAPINE FUMARATE 200 MG PO TABS: 200.0000 mg | ORAL_TABLET | Freq: Every day | ORAL | 2 refills | Status: DC

## 2022-02-09 NOTE — Progress Notes (Signed)
Subjective:    Patient ID: Frank [name], male    DOB: 1950-12-26, 71 y.o.   MRN: [id number]  HPI  71 year old male who  has a past medical history of Anxiety, Depression, Drug addiction ([place]), High cholesterol, Hypertensive urgency, and Tinnitus.  He presents to the office today for follow-up after being seen by [facility] neurosurgery on January 21, 2022.  He was seen at neurosurgery for chronic neck, mid thoracic, and low back pain.  His imaging has revealed moderate stenosis of the cervical spine but did not have any significant radicular or myelopathy type signs or symptoms.  He was referred to medial branch block but this did not help.  He is also gone through physical therapy in the past with varying degrees of improvement.  Neurosurgery informed him that his back pain is likely due to spondylosis and that no surgery could be performed to help with his symptoms.  He was prescribed Robaxin and advised to continue with anti-inflammatories.  Despite taking the Robaxin and anti-inflammatories his symptoms continued to get worse.  He states "every day I wake up the pain in my back and neck is worse than the day before, it feels like my muscles are pulling on my bones"  He has been taking 3-4 hot showers a day and doing stretching exercises with very little relief.  He is not sleeping well due to the pain despite taking Seroquel 100 mg, he is wondering if we can increase this for the time being so that he can get some sleep   Review of Systems See HPI   Past Medical History:  Diagnosis Date   Anxiety    Depression    Drug addiction ([place])    High cholesterol    Hypertensive urgency    Tinnitus     Social History   Socioeconomic History   Marital status: Divorced    Spouse name: Not on file   Number of children: Not on file   Years of education: Not on file   Highest education level: Not on file  Occupational History   Not on file  Tobacco Use   Smoking status: Never   Smokeless  tobacco: Never  Vaping Use   Vaping Use: Never used  Substance and Sexual Activity   Alcohol use: Yes    Alcohol/week: 10.0 standard drinks of alcohol    Types: 10 Glasses of wine per week   Drug use: Yes    Types: Other-see comments, "Crack" cocaine, Cocaine, Marijuana    Comment: current marijuana use  twice a month    Sexual activity: Not on file    Comment: oxycontin  Other Topics Concern   Not on file  Social History Narrative   Works as a Education officer, 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    Social Determinants of Health   Financial Resource Strain: Low Risk  (10/16/2021)   Overall Financial Resource Strain (CARDIA)    Difficulty of Paying Living Expenses: Not hard at all  Food Insecurity: No Food Insecurity (10/16/2021)   Hunger Vital Sign    Worried About Running Out of Food in the Last Year: Never true    Ran Out of Food in the Last Year: Never true  Transportation Needs: No Transportation Needs (10/16/2021)   PRAPARE - Hydrologist (Medical): No    Lack of Transportation (Non-Medical): No  Physical Activity: Sufficiently Active (  10/16/2021)   Exercise Vital Sign    Days of Exercise per Week: 3 days    Minutes of Exercise per Session: 60 min  Stress: No Stress Concern Present (10/16/2021)   [place]    Feeling of Stress : Not at all  Social Connections: Socially Isolated (10/16/2021)   Social Connection and Isolation Panel [NHANES]    Frequency of Communication with Friends and Family: More than three times a week    Frequency of Social Gatherings with Friends and Family: More than three times a week    Attends Religious Services: Never    Marine scientist or Organizations: No    Attends Archivist Meetings: Never    Marital Status: Divorced  Human resources officer Violence: Not At Risk (10/16/2021)   Humiliation, Afraid, Rape, and  Kick questionnaire    Fear of Current or Ex-Partner: No    Emotionally Abused: No    Physically Abused: No    Sexually Abused: No    Past Surgical History:  Procedure Laterality Date   ESOPHAGOGASTRODUODENOSCOPY N/A 11/04/2021   Procedure: ESOPHAGOGASTRODUODENOSCOPY (EGD);  Surgeon: Daryel November, MD;  Location: [facility];  Service: Gastroenterology;  Laterality: N/A;   SKIN CANCER EXCISION  2015   TONSILLECTOMY     WISDOM TOOTH EXTRACTION     nausea post op    Family History  Problem Relation Age of Onset   Breast cancer Mother        Died at 33   Lung cancer Father    Prostate cancer Father        Died at 69    Colon cancer Paternal Grandmother    Esophageal cancer Neg Hx    Rectal cancer Neg Hx    Stomach cancer Neg Hx    Colon polyps Neg Hx     No Known Allergies  Current Outpatient Medications on File Prior to Visit  Medication Sig Dispense Refill   amLODipine (NORVASC) 10 MG tablet TAKE ONE TABLET BY MOUTH DAILY 90 tablet 1   atorvastatin (LIPITOR) 40 MG tablet TAKE 1 TABLET BY MOUTH DAILY 90 tablet 3   gabapentin (NEURONTIN) 300 MG capsule Take 1 capsule (300 mg total) by mouth 2 (two) times daily. 180 capsule 0   ibuprofen (ADVIL) 200 MG tablet Take 200 mg by mouth every 6 (six) hours as needed for fever or mild pain.     lisinopril (ZESTRIL) 40 MG tablet TAKE 1 TABLET BY MOUTH EVERY DAY 90 tablet 0   meloxicam (MOBIC) 15 MG tablet TAKE 1 TABLET(15 MG) BY MOUTH DAILY 90 tablet 1   sildenafil (VIAGRA) 50 MG tablet Take 50 mg by mouth as needed for erectile dysfunction.     tamsulosin (FLOMAX) 0.4 MG CAPS capsule Take 2 capsules (0.8 mg total) by mouth daily after supper. 60 capsule 5   methocarbamol (ROBAXIN) 500 MG tablet Take 500 mg by mouth every 8 (eight) hours as needed.     No current facility-administered medications on file prior to visit.    BP (!) 140/90   Pulse 88   Temp 98.8 F (37.1 C) (Oral)   Ht 5' 8.5" (1.74 m)   Wt 187 lb (84.8 kg)    SpO2 97%   BMI 28.02 kg/m       Objective:   Physical Exam Vitals and nursing note reviewed.  Constitutional:      Appearance: Normal appearance.  Cardiovascular:     Rate  and Rhythm: Normal rate and regular rhythm.     Pulses: Normal pulses.     Heart sounds: Normal heart sounds.  Pulmonary:     Effort: Pulmonary effort is normal.     Breath sounds: Normal breath sounds.  Musculoskeletal:        General: Normal range of motion.  Skin:    General: Skin is warm and dry.  Neurological:     General: No focal deficit present.     Mental Status: He is alert and oriented to person, place, and time.       Assessment & Plan:  1. Cervical spine pain -Refer to physical medicine and rehab for further evaluation and to see if they can help even if it is with medication management. He would also like to be referred back to PT  - Ambulatory referral to Physical Medicine Rehab - Ambulatory referral to Physical Therapy  2. Thoracic spine pain  - Ambulatory referral to Physical Medicine Rehab - Ambulatory referral to Physical Therapy  3. Primary insomnia  - QUEtiapine (SEROQUEL) 200 MG tablet; Take 1 tablet (200 mg total) by mouth at bedtime.  Dispense: 90 tablet; Refill: 2  Dorothyann Peng, NP  Time of total for care on the day of the encounter 30 minutes. This includes time spent in both face-to-face and non-face-to-face activities including preparing for the visit, reviewing the chart, time spent with patient, evaluation and counseling patient on treatment options including second opinions and other specialists for chronic back pain. We also discussed his chronic insomnia and the need for possible referral to a psyciatrist

## 2022-02-11 ENCOUNTER — Telehealth: Payer: Self-pay | Admitting: Adult Health

## 2022-02-11 NOTE — Telephone Encounter (Signed)
Pt inquiring on referral, did not go into detail

## 2022-02-12 ENCOUNTER — Encounter: Payer: Self-pay | Admitting: Physical Medicine & Rehabilitation

## 2022-02-12 NOTE — Telephone Encounter (Signed)
Pt is calling to let cory know there is a possible that physical med rehab make not accept him and would like cory to return his call

## 2022-02-12 NOTE — Telephone Encounter (Signed)
Pt called again stating it is URGENT that NP call him back

## 2022-02-18 ENCOUNTER — Other Ambulatory Visit: Payer: Self-pay | Admitting: Adult Health

## 2022-02-18 DIAGNOSIS — M546 Pain in thoracic spine: Secondary | ICD-10-CM

## 2022-02-18 DIAGNOSIS — M542 Cervicalgia: Secondary | ICD-10-CM

## 2022-02-25 ENCOUNTER — Other Ambulatory Visit: Payer: Self-pay | Admitting: Adult Health

## 2022-02-25 DIAGNOSIS — I10 Essential (primary) hypertension: Secondary | ICD-10-CM

## 2022-03-24 ENCOUNTER — Telehealth: Payer: Self-pay | Admitting: Adult Health

## 2022-03-24 NOTE — Telephone Encounter (Signed)
Requesting referral to dermatologist. Had a visit with a home health agency that suggested he see a dermatologist for some areas on his skin and states this was discussed with his PCP at a prior appointment.

## 2022-03-25 NOTE — Telephone Encounter (Signed)
Please advise 

## 2022-03-26 NOTE — Telephone Encounter (Signed)
Spoke to pt and he stated he knew the referral was placed but no one has called him. I called [facility] die to the referral note status saying closed. Spoke to a referral coordinator and she stated that the previous referral coordinator did tried to call pt but no answer and vm as full so the referral was closed. Referral coordinator stated pt can call to schedule an appt. Pt notified of update.

## 2022-03-26 NOTE — Telephone Encounter (Signed)
Tried to call pt but no answer

## 2022-03-26 NOTE — Telephone Encounter (Signed)
Pt called, returning CMA's call. CMA was unavailable. Pt asked that CMA call back at her earliest convenience. 

## 2022-04-13 ENCOUNTER — Other Ambulatory Visit: Payer: Self-pay | Admitting: Adult Health

## 2022-04-13 ENCOUNTER — Encounter: Payer: Self-pay | Admitting: Physical Medicine & Rehabilitation

## 2022-04-13 ENCOUNTER — Encounter: Payer: Medicare Other | Attending: Physical Medicine & Rehabilitation | Admitting: Physical Medicine & Rehabilitation

## 2022-04-13 VITALS — BP 165/93 | HR 94 | Ht 68.5 in | Wt 188.4 lb

## 2022-04-13 DIAGNOSIS — M47812 Spondylosis without myelopathy or radiculopathy, cervical region: Secondary | ICD-10-CM | POA: Insufficient documentation

## 2022-04-13 DIAGNOSIS — M4802 Spinal stenosis, cervical region: Secondary | ICD-10-CM | POA: Diagnosis not present

## 2022-04-13 DIAGNOSIS — M797 Fibromyalgia: Secondary | ICD-10-CM | POA: Insufficient documentation

## 2022-04-13 DIAGNOSIS — I10 Essential (primary) hypertension: Secondary | ICD-10-CM

## 2022-04-13 DIAGNOSIS — M47816 Spondylosis without myelopathy or radiculopathy, lumbar region: Secondary | ICD-10-CM | POA: Insufficient documentation

## 2022-04-13 MED ORDER — DULOXETINE HCL 20 MG PO CPEP: 20.0000 mg | ORAL_CAPSULE | Freq: Every day | ORAL | 1 refills | Status: DC
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
## 2022-04-13 NOTE — Patient Instructions (Signed)
Duloxetine Delayed-Release Capsules What is this medication? DULOXETINE (doo LOX e teen) treats depression, anxiety, fibromyalgia, and certain types of chronic pain such as nerve, bone, or joint pain. It increases the amount of serotonin and norepinephrine in the brain, hormones that help regulate mood and pain. It belongs to a group of medications called SNRIs. This medicine may be used for other purposes; ask your health care provider or pharmacist if you have questions. COMMON BRAND NAME(S): Cymbalta, Drizalma, Irenka What should I tell my care team before I take this medication? They need to know if you have any of these conditions: Bipolar disorder Glaucoma High blood pressure Kidney disease Liver disease Seizures Suicidal thoughts, plans or attempt; a previous suicide attempt by you or a family member Take medications that treat or prevent blood clots Taken medications called MAOIs like Carbex, Eldepryl, Marplan, Nardil, and Parnate within 14 days Trouble passing urine An unusual reaction to duloxetine, other medications, foods, dyes, or preservatives Pregnant or trying to get pregnant Breast-feeding How should I use this medication? Take this medication by mouth with a glass of water. Follow the directions on the prescription label. Do not crush, cut or chew some capsules of this medication. Some capsules may be opened and sprinkled on applesauce. Check with your care team or pharmacist if you are not sure. You can take this medication with or without food. Take your medication at regular intervals. Do not take your medication more often than directed. Do not stop taking this medication suddenly except upon the advice of your care team. Stopping this medication too quickly may cause serious side effects or your condition may worsen. A special MedGuide will be given to you by the pharmacist with each prescription and refill. Be sure to read this information carefully each time. Talk to  your care team regarding the use of this medication in children. While this medication may be prescribed for children as young as 7 years of age for selected conditions, precautions do apply. Overdosage: If you think you have taken too much of this medicine contact a poison control center or emergency room at once. NOTE: This medicine is only for you. Do not share this medicine with others. What if I miss a dose? If you miss a dose, take it as soon as you can. If it is almost time for your next dose, take only that dose. Do not take double or extra doses. What may interact with this medication? Do not take this medication with any of the following: Desvenlafaxine Levomilnacipran Linezolid MAOIs like Carbex, Eldepryl, Emsam, Marplan, Nardil, and Parnate Methylene blue (injected into a vein) Milnacipran Safinamide Thioridazine Venlafaxine Viloxazine This medication may also interact with the following: Alcohol Amphetamines Aspirin and aspirin-like medications Certain antibiotics like ciprofloxacin and enoxacin Certain medications for blood pressure, heart disease, irregular heart beat Certain medications for depression, anxiety, or psychotic disturbances Certain medications for migraine headache like almotriptan, eletriptan, frovatriptan, naratriptan, rizatriptan, sumatriptan, zolmitriptan Certain medications that treat or prevent blood clots like warfarin, enoxaparin, and dalteparin Cimetidine Fentanyl Lithium NSAIDS, medications for pain and inflammation, like ibuprofen or naproxen Phentermine Procarbazine Rasagiline Sibutramine St. John's wort Theophylline Tramadol Tryptophan This list may not describe all possible interactions. Give your health care provider a list of all the medicines, herbs, non-prescription drugs, or dietary supplements you use. Also tell them if you smoke, drink alcohol, or use illegal drugs. Some items may interact with your medicine. What should I watch  for while using this medication? Tell your   care team if your symptoms do not get better or if they get worse. Visit your care team for regular checks on your progress. Because it may take several weeks to see the full effects of this medication, it is important to continue your treatment as prescribed by your care team. This medication may cause serious skin reactions. They can happen weeks to months after starting the medication. Contact your care team right away if you notice fevers or flu-like symptoms with a rash. The rash may be red or purple and then turn into blisters or peeling of the skin. Or, you might notice a red rash with swelling of the face, lips, or lymph nodes in your neck or under your arms. Watch for new or worsening thoughts of suicide or depression. This includes sudden changes in mood, behaviors, or thoughts. These changes can happen at any time but are more common in the beginning of treatment or after a change in dose. Call your care team right away if you experience these thoughts or worsening depression. Manic episodes may happen in patients with bipolar disorder who take this medication. Watch for changes in feelings or behaviors such as feeling anxious, nervous, agitated, panicky, irritable, hostile, aggressive, impulsive, severely restless, overly excited and hyperactive, or trouble sleeping. These symptoms can happen at any time, but are more common in the beginning of treatment or after a change in dose. Call your care team right away if you notice any of these symptoms. You may get drowsy or dizzy. Do not drive, use machinery, or do anything that needs mental alertness until you know how this medication affects you. Do not stand or sit up quickly, especially if you are an older patient. This reduces the risk of dizzy or fainting spells. Alcohol may interfere with the effect of this medication. Avoid alcoholic drinks. This medication may increase blood sugar. The risk may be  higher in patients who already have diabetes. Ask your care team what you can do to lower your risk of diabetes while taking this medication. This medication can cause an increase in blood pressure. This medication can also cause a sudden drop in your blood pressure, which may make you feel faint and increase the chance of a fall. These effects are most common when you first start the medication or when the dose is increased, or during use of other medications that can cause a sudden drop in blood pressure. Check with your care team for instructions on monitoring your blood pressure while taking this medication. Your mouth may get dry. Chewing sugarless gum or sucking hard candy, and drinking plenty of water, may help. Contact your care team if the problem does not go away or is severe. What side effects may I notice from receiving this medication? Side effects that you should report to your care team as soon as possible: Allergic reactions--skin rash, itching, hives, swelling of the face, lips, tongue, or throat Bleeding--bloody or black, tar-like stools, red or dark brown urine, vomiting blood or brown material that looks like coffee grounds, small, red or purple spots on skin, unusual bleeding or bruising Increase in blood pressure Liver injury--right upper belly pain, loss of appetite, nausea, light-colored stool, dark yellow or brown urine, yellowing skin or eyes, unusual weakness or fatigue Low sodium level--muscle weakness, fatigue, dizziness, headache, confusion Redness, blistering, peeling, or loosening of the skin, including inside the mouth Serotonin syndrome--irritability, confusion, fast or irregular heartbeat, muscle stiffness, twitching muscles, sweating, high fever, seizures, chills, vomiting, diarrhea   Sudden eye pain or change in vision such as blurry vision, seeing halos around lights, vision loss Thoughts of suicide or self-harm, worsening mood, feelings of depression Trouble passing  urine Side effects that usually do not require medical attention (report to your care team if they continue or are bothersome): Change in sex drive or performance Constipation Diarrhea Dizziness Dry mouth Excessive sweating Loss of appetite Nausea Vomiting This list may not describe all possible side effects. Call your doctor for medical advice about side effects. You may report side effects to FDA at 1-800-FDA-1088. Where should I keep my medication? Keep out of the reach of children and pets. Store at room temperature between 15 and 30 degrees C (59 to 86 degrees F). Get rid of any unused medication after the expiration date. To get rid of medications that are no longer needed or have expired: Take the medication to a medication take-back program. Check with your pharmacy or law enforcement to find a location. If you cannot return the medication, check the label or package insert to see if the medication should be thrown out in the garbage or flushed down the toilet. If you are not sure, ask your care team. If it is safe to put it in the trash, take the medication out of the container. Mix the medication with cat litter, dirt, coffee grounds, or other unwanted substance. Seal the mixture in a bag or container. Put it in the trash. NOTE: This sheet is a summary. It may not cover all possible information. If you have questions about this medicine, talk to your doctor, pharmacist, or health care provider.  2023 Elsevier/Gold Standard (2020-06-24 00:00:00)

## 2022-05-16 ENCOUNTER — Other Ambulatory Visit: Payer: Self-pay | Admitting: Physical Medicine & Rehabilitation

## 2022-05-25 ENCOUNTER — Encounter: Payer: Medicare Other | Attending: Physical Medicine & Rehabilitation | Admitting: Physical Medicine & Rehabilitation

## 2022-05-25 ENCOUNTER — Encounter: Payer: Self-pay | Admitting: Physical Medicine & Rehabilitation

## 2022-05-25 VITALS — BP 189/92 | HR 78 | Ht 68.5 in | Wt 184.6 lb

## 2022-05-25 DIAGNOSIS — M7918 Myalgia, other site: Secondary | ICD-10-CM

## 2022-05-25 MED ORDER — DULOXETINE HCL 20 MG PO CPEP: 20.0000 mg | ORAL_CAPSULE | Freq: Two times a day (BID) | ORAL | 1 refills | Status: DC

## 2022-05-25 MED ORDER — LIDOCAINE HCL 1 % IJ SOLN
9.0000 mL | Freq: Once | INTRAMUSCULAR | Status: AC
  Administered 2022-05-25: 9 mL

## 2022-05-25 NOTE — Progress Notes (Signed)
71 year old male with history of fibromyalgia as well as cervical spondylosis also has a history of myofascial pain syndrome. Patient has noted some improvement with duloxetine proximately 20% reduction in widespread body pain.  His particular complaint today is upper back lower neck pain. He has no radiating pain into his arms. No falls or trauma.  Blood pressure elevated today he got some bad news regarding a custody hearing involving his daughter.  Also did not take his blood pressure medication today.  For blood pressure 189/92  Examination tender areas bilateral upper trapezius bilateral levator scapula bilateral infraspinatus. Motor strength is normal in the upper limbs Mood and affect mildly anxious  Gait without evidence of toe drag or knee instability no evidence of the Impression 1.  Fibromyalgia syndrome overall improved to some degree with duloxetine will increase dose from 20 mg daily to 20 mg twice daily 2.  Myofascial pain syndrome will perform trigger point injections  Trigger Point Injection  Indication:  Myofascial pain not relieved by medication management and other conservative care.  Informed consent was obtained after describing risk and benefits of the procedure with the patient, this includes bleeding, bruising, infection and medication side effects.  The patient wishes to proceed and has given written consent.  The patient was placed in a seated position.  The Bilateral trapezius (3 x bilateral), infraspinatus (1x bilateral),levator scap (1x bilateral ) area was marked and prepped with Betadine.  It was entered with a 25-gauge 1-1/2 inch needle and 1 mL of 1% lidocaine was injected into each of 8 trigger points,1/2 ml into 2 sites  after negative draw back for blood.  The patient tolerated the procedure well.  Post procedure instructions were given. Lidocaine 1% with preservative 62m total , no waste   3.  Hypertension follow-up with PCP advised patient to take his  blood pressure medication as prescribed and to get a new blood pressure monitor and let his PCP know if blood pressure remains elevated.

## 2022-05-25 NOTE — Patient Instructions (Addendum)
Please get another Blood  pressure monitor and take your medication regularly, recheck blood pressure in 1-2 days and if elevated contact your primary MD  Will increase duloxetine to '40mg'$  per day    Trigger Point Injection Trigger points are areas where you have pain. A trigger point injection is a shot given in the trigger point to help relieve pain for a few days to a few months. Common places for trigger points include the neck, shoulders, upper back, or lower back. A trigger point injection will not cure long-term (chronic) pain permanently. These injections do not always work for every person. For some people, they can help to relieve pain for a few days to a few months. Tell a health care provider about: Any allergies you have. All medicines you are taking, including vitamins, herbs, eye drops, creams, and over-the-counter medicines. Any problems you or family members have had with anesthetic medicines. Any bleeding problems you have. Any surgeries you have had. Any medical conditions you have. Whether you are pregnant or may be pregnant. What are the risks? Generally, this is a safe procedure. However, problems may occur, including: Infection. Bleeding or bruising. Allergic reaction to the injected medicine. Irritation of the skin around the injection site. What happens before the procedure? Ask your health care provider about: Changing or stopping your regular medicines. This is especially important if you are taking diabetes medicines or blood thinners. Taking medicines such as aspirin and ibuprofen. These medicines can thin your blood. Do not take these medicines unless your health care provider tells you to take them. Taking over-the-counter medicines, vitamins, herbs, and supplements. What happens during the procedure?  Your health care provider will feel for trigger points. A marker may be used to circle the area for the injection. The skin over the trigger point will be  washed with a germ-killing soap. You may be given a medicine to help you relax (sedative). A thin needle is used for the injection. You may feel pain or a twitching feeling when the needle enters your skin. A numbing solution may be injected into the trigger point. Sometimes a medicine to keep down inflammation is also injected. Your health care provider may move the needle around the area where the trigger point is located until the tightness and twitching goes away. After the injection, your health care provider may put gentle pressure over the injection site. The injection site will be covered with a bandage (dressing). The procedure may vary among health care providers and hospitals. What can I expect after treatment? After treatment, you may have soreness and stiffness for 1-2 days. Follow these instructions at home: Injection site care Remove your dressing in a few hours, or as told by your health care provider. Check your injection site every day for signs of infection. Check for: Redness, swelling, or pain. Fluid or blood. Warmth. Pus or a bad smell. Managing pain, stiffness, and swelling If directed, put ice on the affected area. To do this: Put ice in a plastic bag. Place a towel between your skin and the bag. Leave the ice on for 20 minutes, 2-3 times a day. Remove the ice if your skin turns bright red. This is very important. If you cannot feel pain, heat, or cold, you have a greater risk of damage to the area. Activity If you were given a sedative during the procedure, it can affect you for several hours. Do not drive or operate machinery until your health care provider says that it  is safe. Do not take baths, swim, or use a hot tub until your health care provider approves. Return to your normal activities as told by your health care provider. Ask your health care provider what activities are safe for you. General instructions If you were asked to stop your regular  medicines, ask your health care provider when you may start taking them again. You may be asked to see an occupational or physical therapist for exercises that reduce muscle strain and stretch the area of the trigger point. Keep all follow-up visits. This is important. Contact a health care provider if: Your pain comes back, and it is worse than before the injection. You may need more injections. You have chills or a fever. The injection site becomes more painful, red, swollen, or warm to the touch. Summary A trigger point injection is a shot given in the trigger point to help relieve pain. Common places for trigger point injections are the neck, shoulders, upper back, and lower back. These injections do not always work for every person, but for some people, the injections can help to relieve pain for a few days to a few months. Contact a health care provider if symptoms come back or if they are worse than before treatment. Also, get help if the injection site becomes more painful, red, swollen, or warm to the touch. This information is not intended to replace advice given to you by your health care provider. Make sure you discuss any questions you have with your health care provider. Document Revised: 09/23/2020 Document Reviewed: 09/23/2020 Elsevier Patient Education  [place].

## 2022-05-26 ENCOUNTER — Other Ambulatory Visit: Payer: Self-pay | Admitting: Adult Health

## 2022-05-26 DIAGNOSIS — I10 Essential (primary) hypertension: Secondary | ICD-10-CM

## 2022-06-09 ENCOUNTER — Ambulatory Visit (INDEPENDENT_AMBULATORY_CARE_PROVIDER_SITE_OTHER): Payer: Medicare Other | Admitting: Adult Health

## 2022-06-09 ENCOUNTER — Encounter: Payer: Self-pay | Admitting: Adult Health

## 2022-06-09 VITALS — BP 138/80 | HR 87 | Temp 98.2°F | Ht 68.5 in | Wt 184.0 lb

## 2022-06-09 DIAGNOSIS — J029 Acute pharyngitis, unspecified: Secondary | ICD-10-CM

## 2022-06-09 DIAGNOSIS — J40 Bronchitis, not specified as acute or chronic: Secondary | ICD-10-CM | POA: Diagnosis not present

## 2022-06-09 LAB — POC COVID19 BINAXNOW: SARS Coronavirus 2 Ag: NEGATIVE

## 2022-06-09 LAB — POCT INFLUENZA A/B
Influenza A, POC: NEGATIVE
Influenza B, POC: NEGATIVE

## 2022-06-09 LAB — POCT RAPID STREP A (OFFICE): Rapid Strep A Screen: NEGATIVE

## 2022-06-09 MED ORDER — PREDNISONE 10 MG PO TABS: ORAL_TABLET | ORAL | 0 refills | Status: DC

## 2022-06-09 MED ORDER — BENZONATATE 200 MG PO CAPS: 200.0000 mg | ORAL_CAPSULE | Freq: Two times a day (BID) | ORAL | 0 refills | Status: DC | PRN

## 2022-06-09 NOTE — Progress Notes (Signed)
Subjective:    Patient ID: Frank [name], male    DOB: 07-04-1950, 71 y.o.   MRN: [id number]  HPI 71 year old male who  has a past medical history of Anxiety, Depression, Drug addiction ([place]), High cholesterol, Hypertensive urgency, and Tinnitus.  He presents to the office today for an acute issue. His symptoms started about a week ago.   His symptoms include sore throat ( resolved) chest and head congestion, cough,  loss of taste or smell and wheezing. He feels like his symptoms are improving to some degree. Wheezing worse at night   Denies fevers or chills.    Review of Systems See HPI   Past Medical History:  Diagnosis Date   Anxiety    Depression    Drug addiction ([place])    High cholesterol    Hypertensive urgency    Tinnitus     Social History   Socioeconomic History   Marital status: Divorced    Spouse name: Not on file   Number of children: Not on file   Years of education: Not on file   Highest education level: Not on file  Occupational History   Not on file  Tobacco Use   Smoking status: Never   Smokeless tobacco: Never  Vaping Use   Vaping Use: Never used  Substance and Sexual Activity   Alcohol use: Yes    Alcohol/week: 10.0 standard drinks of alcohol    Types: 10 Glasses of wine per week   Drug use: Yes    Types: Other-see comments, "Crack" cocaine, Cocaine, Marijuana    Comment: current marijuana use  twice a month    Sexual activity: Not on file    Comment: oxycontin  Other Topics Concern   Not on file  Social History Narrative   Works as a Education officer, 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    Social Determinants of Health   Financial Resource Strain: Low Risk  (10/16/2021)   Overall Financial Resource Strain (CARDIA)    Difficulty of Paying Living Expenses: Not hard at all  Food Insecurity: No Food Insecurity (10/16/2021)   Hunger Vital Sign    Worried About Running Out of Food in the Last Year: Never true     Ran Out of Food in the Last Year: Never true  Transportation Needs: No Transportation Needs (10/16/2021)   PRAPARE - Hydrologist (Medical): No    Lack of Transportation (Non-Medical): No  Physical Activity: Sufficiently Active (10/16/2021)   Exercise Vital Sign    Days of Exercise per Week: 3 days    Minutes of Exercise per Session: 60 min  Stress: No Stress Concern Present (10/16/2021)   [place]    Feeling of Stress : Not at all  Social Connections: Socially Isolated (10/16/2021)   Social Connection and Isolation Panel [NHANES]    Frequency of Communication with Friends and Family: More than three times a week    Frequency of Social Gatherings with Friends and Family: More than three times a week    Attends Religious Services: Never    Marine scientist or Organizations: No    Attends Archivist Meetings: Never    Marital Status: Divorced  Human resources officer Violence: Not At Risk (10/16/2021)   Humiliation, Afraid, Rape, and Kick questionnaire    Fear of Current or Ex-Partner: No  Emotionally Abused: No    Physically Abused: No    Sexually Abused: No    Past Surgical History:  Procedure Laterality Date   ESOPHAGOGASTRODUODENOSCOPY N/A 11/04/2021   Procedure: ESOPHAGOGASTRODUODENOSCOPY (EGD);  Surgeon: Daryel November, MD;  Location: [facility];  Service: Gastroenterology;  Laterality: N/A;   SKIN CANCER EXCISION  2015   TONSILLECTOMY     WISDOM TOOTH EXTRACTION     nausea post op    Family History  Problem Relation Age of Onset   Breast cancer Mother        Died at 80   Lung cancer Father    Prostate cancer Father        Died at 40    Colon cancer Paternal Grandmother    Esophageal cancer Neg Hx    Rectal cancer Neg Hx    Stomach cancer Neg Hx    Colon polyps Neg Hx     No Known Allergies  Current Outpatient Medications on File Prior to Visit   Medication Sig Dispense Refill   amLODipine (NORVASC) 10 MG tablet TAKE 1 TABLET BY MOUTH DAILY 90 tablet 1   atorvastatin (LIPITOR) 40 MG tablet TAKE 1 TABLET BY MOUTH DAILY 90 tablet 3   DULoxetine (CYMBALTA) 20 MG capsule Take 1 capsule (20 mg total) by mouth 2 (two) times daily. 30 capsule 1   gabapentin (NEURONTIN) 300 MG capsule TAKE 1 CAPSULE(300 MG) BY MOUTH TWICE DAILY 180 capsule 0   ibuprofen (ADVIL) 200 MG tablet Take 200 mg by mouth every 6 (six) hours as needed for fever or mild pain.     lisinopril (ZESTRIL) 40 MG tablet TAKE 1 TABLET BY MOUTH EVERY DAY 90 tablet 0   meloxicam (MOBIC) 15 MG tablet TAKE 1 TABLET(15 MG) BY MOUTH DAILY 90 tablet 1   methocarbamol (ROBAXIN) 500 MG tablet Take 500 mg by mouth every 8 (eight) hours as needed.     Multiple Vitamin (MULTIVITAMIN ADULT PO) Take 1,500 mcg by mouth daily.     QUEtiapine (SEROQUEL) 200 MG tablet Take 1 tablet (200 mg total) by mouth at bedtime. 90 tablet 2   sildenafil (VIAGRA) 50 MG tablet Take 50 mg by mouth as needed for erectile dysfunction.     tamsulosin (FLOMAX) 0.4 MG CAPS capsule Take 2 capsules (0.8 mg total) by mouth daily after supper. 60 capsule 5   TURMERIC CURCUMIN PO Take 1,500 mg by mouth daily.     No current facility-administered medications on file prior to visit.    BP 138/80   Pulse 87   Temp 98.2 F (36.8 C) (Oral)   Ht 5' 8.5" (1.74 m)   Wt 184 lb (83.5 kg)   SpO2 98%   BMI 27.57 kg/m       Objective:   Physical Exam Vitals reviewed.  Constitutional:      Appearance: Normal appearance.  HENT:     Right Ear: Tympanic membrane, ear canal and external ear normal. There is no impacted cerumen.     Left Ear: Tympanic membrane and ear canal normal. There is no impacted cerumen.     Nose: Nose normal. No congestion or rhinorrhea.  Cardiovascular:     Rate and Rhythm: Normal rate and regular rhythm.     Pulses: Normal pulses.     Heart sounds: Normal heart sounds.  Pulmonary:      Effort: Pulmonary effort is normal.     Breath sounds: Wheezing (expiratory wheezing in all lung fields) present.  Neurological:  General: No focal deficit present.     Mental Status: He is alert and oriented to person, place, and time.  Psychiatric:        Mood and Affect: Mood normal.        Behavior: Behavior normal.        Thought Content: Thought content normal.        Judgment: Judgment normal.        Assessment & Plan:  1. Sore throat - POC COVID-19- negative - POC Rapid Strep A- negative - POC Influenza A/B- negative  2. Bronchitis - No signs of bacterial infection.  - predniSONE (DELTASONE) 10 MG tablet; 40 mg x 3 days, 20 mg x 3 days, 10 mg x 3 days  Dispense: 21 tablet; Refill: 0 - benzonatate (TESSALON) 200 MG capsule; Take 1 capsule (200 mg total) by mouth 2 (two) times daily as needed for cough.  Dispense: 20 capsule; Refill: 0 - Follow up if not improving in the next 3-4 days    Dorothyann Peng, NP

## 2022-06-18 ENCOUNTER — Other Ambulatory Visit: Payer: Self-pay | Admitting: Physical Medicine & Rehabilitation

## 2022-07-17 ENCOUNTER — Other Ambulatory Visit: Payer: Self-pay | Admitting: Physical Medicine & Rehabilitation

## 2022-07-20 ENCOUNTER — Telehealth: Payer: Self-pay

## 2022-07-20 NOTE — Telephone Encounter (Signed)
Already pending orders

## 2022-07-21 DIAGNOSIS — L821 Other seborrheic keratosis: Secondary | ICD-10-CM | POA: Diagnosis not present

## 2022-07-21 DIAGNOSIS — Z85828 Personal history of other malignant neoplasm of skin: Secondary | ICD-10-CM | POA: Diagnosis not present

## 2022-07-21 DIAGNOSIS — L814 Other melanin hyperpigmentation: Secondary | ICD-10-CM | POA: Diagnosis not present

## 2022-07-21 DIAGNOSIS — Z08 Encounter for follow-up examination after completed treatment for malignant neoplasm: Secondary | ICD-10-CM | POA: Diagnosis not present

## 2022-07-21 DIAGNOSIS — D225 Melanocytic nevi of trunk: Secondary | ICD-10-CM | POA: Diagnosis not present

## 2022-07-22 DIAGNOSIS — R3914 Feeling of incomplete bladder emptying: Secondary | ICD-10-CM | POA: Diagnosis not present

## 2022-07-23 ENCOUNTER — Encounter: Payer: Self-pay | Admitting: Physical Medicine & Rehabilitation

## 2022-07-23 ENCOUNTER — Encounter: Payer: Medicare Other | Attending: Physical Medicine & Rehabilitation | Admitting: Physical Medicine & Rehabilitation

## 2022-07-23 VITALS — BP 127/78 | HR 77 | Ht 68.5 in | Wt 179.0 lb

## 2022-07-23 DIAGNOSIS — M25512 Pain in left shoulder: Secondary | ICD-10-CM | POA: Diagnosis not present

## 2022-07-23 DIAGNOSIS — G8929 Other chronic pain: Secondary | ICD-10-CM | POA: Insufficient documentation

## 2022-07-23 DIAGNOSIS — M25511 Pain in right shoulder: Secondary | ICD-10-CM

## 2022-07-23 DIAGNOSIS — M7918 Myalgia, other site: Secondary | ICD-10-CM | POA: Diagnosis not present

## 2022-07-23 MED ORDER — DULOXETINE HCL 20 MG PO CPEP: 20.0000 mg | ORAL_CAPSULE | Freq: Two times a day (BID) | ORAL | 1 refills | Status: DC

## 2022-07-23 MED ORDER — LIDOCAINE HCL 1 % IJ SOLN
5.0000 mL | Freq: Once | INTRAMUSCULAR | Status: AC
  Administered 2022-07-23: 5 mL

## 2022-07-23 NOTE — Progress Notes (Signed)
Subjective:    Patient ID: Frank [name], male    DOB: Sep 07, 1950, 72 y.o.   MRN: [id number]  HPI 72 year old male with chronic neck and upper back pain as well as bilateral shoulder girdle pain returns today with complaints of neck and shoulder pain and popping.  Since starting duloxetine initially at 20 mg/day the patient's widespread pain was reduced by approximately 20%.  The patient had increased dose to 20 mg twice a day and his pain relief is around 25% less overall.  The patient continues to complain of pain in his lower neck upper back area also notes pain around the shoulders.  He feels like there is popping around the neck as well as the shoulders.  No recent falls or trauma.  Reviewed cervical spine MRI which demonstrated multilevel facet arthrosis which is likely contributing to the popping sensation in the neck. The patient does not have any significant pain going into the arms.  No balance disorder. Bilateral shoulder xray  Pain Inventory Average Pain 6 Pain Right Now 6 My pain is constant, sharp, burning, dull, stabbing, tingling, and aching  In the last 24 hours, has pain interfered with the following? General activity 0 Relation with others 6 Enjoyment of life 6 What TIME of day is your pain at its worst? morning , evening, and night Sleep (in general) Good  Pain is worse with: unsure Pain improves with: rest, medication, TENS, and therapy, moist heat Relief from Meds:  none but Bio Freeze helps  Family History  Problem Relation Age of Onset   Breast cancer Mother        Died at 57   Lung cancer Father    Prostate cancer Father        Died at 68    Colon cancer Paternal Grandmother    Esophageal cancer Neg Hx    Rectal cancer Neg Hx    Stomach cancer Neg Hx    Colon polyps Neg Hx    Social History   Socioeconomic History   Marital status: Divorced    Spouse name: Not on file   Number of children: Not on file   Years of education: Not on file   Highest  education level: Not on file  Occupational History   Not on file  Tobacco Use   Smoking status: Never   Smokeless tobacco: Never  Vaping Use   Vaping Use: Never used  Substance and Sexual Activity   Alcohol use: Yes    Alcohol/week: 10.0 standard drinks of alcohol    Types: 10 Glasses of wine per week   Drug use: Yes    Types: Other-see comments, "Crack" cocaine, Cocaine, Marijuana    Comment: current marijuana use  twice a month    Sexual activity: Not on file    Comment: oxycontin  Other Topics Concern   Not on file  Social History Narrative   Works as a Education officer, 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    Social Determinants of Health   Financial Resource Strain: Low Risk  (10/16/2021)   Overall Financial Resource Strain (CARDIA)    Difficulty of Paying Living Expenses: Not hard at all  Food Insecurity: No Food Insecurity (10/16/2021)   Hunger Vital Sign    Worried About Running Out of Food in the Last Year: Never true    Ran Out of Food in the Last Year: Never true  Transportation Needs: No  Transportation Needs (10/16/2021)   PRAPARE - Hydrologist (Medical): No    Lack of Transportation (Non-Medical): No  Physical Activity: Sufficiently Active (10/16/2021)   Exercise Vital Sign    Days of Exercise per Week: 3 days    Minutes of Exercise per Session: 60 min  Stress: No Stress Concern Present (10/16/2021)   [place]    Feeling of Stress : Not at all  Social Connections: Socially Isolated (10/16/2021)   Social Connection and Isolation Panel [NHANES]    Frequency of Communication with Friends and Family: More than three times a week    Frequency of Social Gatherings with Friends and Family: More than three times a week    Attends Religious Services: Never    Marine scientist or Organizations: No    Attends Archivist Meetings:  Never    Marital Status: Divorced   Past Surgical History:  Procedure Laterality Date   ESOPHAGOGASTRODUODENOSCOPY N/A 11/04/2021   Procedure: ESOPHAGOGASTRODUODENOSCOPY (EGD);  Surgeon: Daryel November, MD;  Location: [facility];  Service: Gastroenterology;  Laterality: N/A;   SKIN CANCER EXCISION  2015   TONSILLECTOMY     WISDOM TOOTH EXTRACTION     nausea post op   Past Surgical History:  Procedure Laterality Date   ESOPHAGOGASTRODUODENOSCOPY N/A 11/04/2021   Procedure: ESOPHAGOGASTRODUODENOSCOPY (EGD);  Surgeon: Daryel November, MD;  Location: [facility];  Service: Gastroenterology;  Laterality: N/A;   SKIN CANCER EXCISION  2015   TONSILLECTOMY     WISDOM TOOTH EXTRACTION     nausea post op   Past Medical History:  Diagnosis Date   Anxiety    Depression    Drug addiction ([place])    High cholesterol    Hypertensive urgency    Tinnitus    Ht 5' 8.5" (1.74 m)   Wt 179 lb (81.2 kg)   BMI 26.82 kg/m   Opioid Risk Score:   Fall Risk Score:  `1  Depression screen [facility] 2/9     07/23/2022    2:11 PM 05/25/2022    3:02 PM 04/13/2022   10:17 AM 10/16/2021   11:40 AM 08/05/2021    9:43 AM 07/21/2021   12:50 PM 07/01/2021    2:06 PM  Depression screen PHQ 2/9  Decreased Interest 0 1 1 0 0 1 1  Down, Depressed, Hopeless 0 1 1 0 0 0 1  PHQ - 2 Score 0 2 2 0 0 1 2  Altered sleeping   1  0  1  Tired, decreased energy   1  0  1  Change in appetite   0  0  1  Feeling bad or failure about yourself    1  0  1  Trouble concentrating   1  0  1  Moving slowly or fidgety/restless   0  0  0  Suicidal thoughts     0  0  PHQ-9 Score   6  0  7  Difficult doing work/chores   Somewhat difficult    Somewhat difficult    Review of Systems  Musculoskeletal:  Positive for back pain and neck pain.  All other systems reviewed and are negative.      Objective:   Physical Exam Cervical spine range of motion limited to 50% cervical extension 75% flexion 75% right rotation 50% left  rotation The patient has tenderness palpation bilateral upper trapezius area bilateral infraspinatus  area bilateral levator scapula area. Shoulder range of motion is full Motor strength is normal in the upper extremities. There is crepitus at the right greater than left shoulder with range of motion.  Also crepitus at the C-spine lower levels with cervical range of motion. Ambulates without assistive device no evidence of toe drag and instability       Assessment & Plan:   1.  Upper back and cervical pain likely combination of his cervical spondylosis with myofascial pain dysfunction syndrome.  Given his shoulder crepitus I am also suspecting that he may have osteoarthritis of the glenohumeral joints. We discussed additional strengthening and range of motion exercises.  We discussed a trial of physical therapy versus trying some yoga.  He will pursue yoga given that he knows some long-term practitioners. 2.  Widespread body pain with hypersensitivity, continue duloxetine 20 mg twice daily have adjusted his prescription. 3.  Discrete myofascial pain may benefit from repeat trigger point injections. Trigger Point Injection  Indication: cervical Myofascial pain not relieved by medication management and other conservative care.  Informed consent was obtained after describing risk and benefits of the procedure with the patient, this includes bleeding, bruising, infection and medication side effects.  The patient wishes to proceed and has given written consent.  The patient was placed in a seated position.  The upper trap, bilateral splenius cap, Levator scap and infraspinatus area were marked and prepped with Betadine.  It was entered with a 25-gauge 1-1/2 inch needle and 1 mL of 1% lidocaine was injected into each of 8 trigger points, after negative draw back for blood.  The patient tolerated the procedure well.  Post procedure instructions were given.

## 2022-07-23 NOTE — Patient Instructions (Addendum)
Yoga may be helpful  Injected trigger points with Lidocaine

## 2022-07-27 ENCOUNTER — Telehealth: Payer: Self-pay | Admitting: Physical Medicine & Rehabilitation

## 2022-07-27 NOTE — Telephone Encounter (Signed)
He was calling to check on the x-ray Dr. Letta Pate wanted him to have done on both shoulders. He wasn't sure if he had put the order in or not.

## 2022-07-30 NOTE — Telephone Encounter (Signed)
Called and left patient a message to let him know that the orders were in and he could go to [facility] or the hospital to get the x-rays done.

## 2022-08-26 ENCOUNTER — Other Ambulatory Visit: Payer: Self-pay | Admitting: *Deleted

## 2022-08-26 ENCOUNTER — Ambulatory Visit
Admission: RE | Admit: 2022-08-26 | Discharge: 2022-08-26 | Disposition: A | Discharge status: Home / Self Care | Payer: Medicare Other | Source: Ambulatory Visit | Attending: Physical Medicine & Rehabilitation | Admitting: Physical Medicine & Rehabilitation

## 2022-08-26 DIAGNOSIS — G8929 Other chronic pain: Secondary | ICD-10-CM

## 2022-08-26 DIAGNOSIS — S42001D Fracture of unspecified part of right clavicle, subsequent encounter for fracture with routine healing: Secondary | ICD-10-CM | POA: Diagnosis not present

## 2022-08-26 DIAGNOSIS — M19012 Primary osteoarthritis, left shoulder: Secondary | ICD-10-CM | POA: Diagnosis not present

## 2022-08-26 MED ORDER — DULOXETINE HCL 20 MG PO CPEP: 20.0000 mg | ORAL_CAPSULE | Freq: Two times a day (BID) | ORAL | 1 refills | Status: DC

## 2022-09-21 ENCOUNTER — Encounter: Payer: Self-pay | Admitting: Physical Medicine & Rehabilitation

## 2022-09-21 ENCOUNTER — Encounter: Payer: Medicare Other | Attending: Physical Medicine & Rehabilitation | Admitting: Physical Medicine & Rehabilitation

## 2022-09-21 VITALS — BP 152/86 | HR 80 | Ht 68.5 in | Wt 179.0 lb

## 2022-09-21 DIAGNOSIS — M7918 Myalgia, other site: Secondary | ICD-10-CM | POA: Diagnosis not present

## 2022-09-21 MED ORDER — CYCLOBENZAPRINE HCL 5 MG PO TABS: 5.0000 mg | ORAL_TABLET | Freq: Every day | ORAL | 0 refills | Status: DC

## 2022-09-21 MED ORDER — LIDOCAINE HCL 1 % IJ SOLN: 4.0000 mL | Freq: Once | INTRAMUSCULAR | Status: AC

## 2022-09-21 NOTE — Patient Instructions (Signed)
May use Ibuprofen 600mg  (3 advils) once or twice a week as needed

## 2022-09-21 NOTE — Progress Notes (Signed)
Indication: cervical Myofascial pain not relieved by medication management and other conservative care.  Pain complaints in upper traps no recent trauma.  Exam- vitals reviewed Gen NAD Mood/affect approp Tenderness in bilateral upper traps   Informed consent was obtained after describing risk and benefits of the procedure with the patient, this includes bleeding, bruising, infection and medication side effects.  The patient wishes to proceed and has given written consent.  The patient was placed in a seated position.  The upper trap Bilateral (2 spots each)  , bilateral infraspinatus area were marked and prepped with Betadine.  It was entered with a 25-gauge 1-1/2 inch needle and 1 mL of 1% lidocaine was injected into each of 6trigger points, after negative draw back for blood.  The patient tolerated the procedure well.  Post procedure instructions were given.  Reviewed shoulder images with pt , no sig shoulder pathology to explain symptoms, Cervical MRI showing multilevel stenosis  F/u in 3 months.  Cont yoga exercises, no need for further imaging at this time  Trial of flexeril for muscle spasm  Discussed intermittant ibuprofen use  600mg  daily , up to 2 days per week

## 2022-10-12 ENCOUNTER — Telehealth: Payer: Self-pay | Admitting: Adult Health

## 2022-10-12 NOTE — Telephone Encounter (Signed)
Contacted Frank [name] to schedule their annual wellness visit. Appointment made for 10/19/22.  [name] AWV direct phone # [phone]   Due to schedule change moved 4/23 awv appt to [employer] schedule

## 2022-10-14 ENCOUNTER — Telehealth: Payer: Self-pay | Admitting: Adult Health

## 2022-10-14 NOTE — Telephone Encounter (Signed)
Contacted Frank [name] to schedule their annual wellness visit. Appointment made for 10/21/22.  Frank [name] AWV direct phone # [phone]   Patient called needing to r/s his 4/23 appt to 4/25

## 2022-10-19 ENCOUNTER — Telehealth: Payer: Medicare Other | Admitting: Family Medicine

## 2022-10-21 ENCOUNTER — Encounter: Payer: Self-pay | Admitting: Family Medicine

## 2022-10-21 ENCOUNTER — Telehealth (INDEPENDENT_AMBULATORY_CARE_PROVIDER_SITE_OTHER): Payer: Medicare Other | Admitting: Family Medicine

## 2022-10-21 VITALS — Wt 173.0 lb

## 2022-10-21 DIAGNOSIS — Z0000 Encounter for general adult medical examination without abnormal findings: Secondary | ICD-10-CM

## 2022-10-21 NOTE — Progress Notes (Signed)
PATIENT CHECK-IN and HEALTH RISK ASSESSMENT QUESTIONNAIRE:  -completed by phone/video for upcoming Medicare Preventive Visit  Pre-Visit Check-in: 1)Vitals (height, wt, BP, etc) - record in vitals section for visit on day of visit 2)Review and Update Medications, Allergies PMH, Surgeries, Social history in Epic 3)Hospitalizations in the last year with date/reason? No  4)Review and Update Care Team (patient's specialists) in Epic 5) Complete PHQ9 in Epic  6) Complete Fall Screening in Epic 7)Review all Health Maintenance Due and order under PCP if not done.  Medicare Wellness Patient Questionnaire:  Answer theses question about your habits: Do you drink alcohol? Yes If yes, how many drinks do you have a day?2  Have you ever smoked?Yes Quit date if applicable? 40 years ago  How many packs a day do/did you smoke? Less than 1 pack Do you use smokeless tobacco?No Do you use an illicit drugs? No Do you exercises? Yes IF so, what type and how many days/minutes per week? Hiking Two days per week, has [employer]  Are you sexually active? Yes Number of partners? 1  He has been working on diet and exercise the last 6 months and has been able to lose 30, eating a lot of vegetables, fish Typical breakfast:  Typical lunch: N/A Typical dinner: Varies  Typical snacks: N/A  Beverages: water. Red wine  Answer theses question about you: Can you perform most household chores? Yes Do you find it hard to follow a conversation in a noisy room? Depends Do you often ask people to speak up or repeat themselves?Yes Do you feel that you have a problem with memory? No Do you balance your checkbook and or bank acounts?Yes Do you feel safe at home?Yes Last dentist visit? 3 weeks ago Do you need assistance with any of the following: Please note if so No  Driving?  Feeding yourself?  Getting from bed to chair?  Getting to the toilet?  Bathing or showering?  Dressing yourself?  Managing  money?  Climbing a flight of stairs  Preparing meals?    Do you have Advanced Directives in place (Living Will, Healthcare Power or Attorney)? No - but reports knows how to do   Last eye Exam and location?  1 Year ago   Do you currently use prescribed or non-prescribed narcotic or opioid pain medications?No  Do you have a history or close family history of breast, ovarian, tubal or peritoneal cancer or a family member with BRCA (breast cancer susceptibility 1 and 2) gene mutations? Yes- mother   Nurse/Assistant Credentials/time stamp: MG 4:23 PM   ----------------------------------------------------------------------------------------------------------------------------------------------------------------------------------------------------------------------    MEDICARE ANNUAL PREVENTIVE CARE VISIT WITH PROVIDER (Welcome to [employer], initial annual wellness or annual wellness exam)  Virtual Visit via Phone Note  I connected with Frank [name] on 10/21/22  by phone and verified that I am speaking with the correct person using two identifiers.  Location patient: home Location provider:work or home office Persons participating in the virtual visit: patient, provider  Concerns and/or follow up today: nothing new. Seeing PMR for his back pain. Has been working on eating healthier and has started doing some exercise - has been successful and has reduced weight and is feeling better.   See HM section in Epic for other details of completed HM.    ROS: negative for report of fevers, unintentional weight loss, vision changes, vision loss, hearing loss or change, chest pain, sob, hemoptysis, melena, hematochezia, hematuria, falls, bleeding or bruising, thoughts of suicide or self harm, memory loss  Patient-completed extensive health risk assessment - reviewed and discussed with the patient: See Health Risk Assessment completed with patient prior to the visit either above or in recent  phone note. This was reviewed in detailed with the patient today and appropriate recommendations, orders and referrals were placed as needed per Summary below and patient instructions.   Review of Medical History: -PMH, PSH, Family History and current specialty and care providers reviewed and updated and listed below   Patient Care Team: Shirline Frees, NP as PCP - General (Family Medicine) [facility], [facility] (Urology) Verner Chol, [facility] (Inactive) as Pharmacist (Pharmacist) Crist Fat, MD as Attending Physician (Urology) Margaretmary Dys, MD as Consulting Physician (Radiation Oncology) Axel Filler, Larna Daughters, NP as Nurse Practitioner (Hematology and Oncology) Maryclare Labrador, RN as Registered Nurse   Past Medical History:  Diagnosis Date   Anxiety    Depression    Drug addiction    High cholesterol    Hypertensive urgency    Tinnitus     Past Surgical History:  Procedure Laterality Date   ESOPHAGOGASTRODUODENOSCOPY N/A 11/04/2021   Procedure: ESOPHAGOGASTRODUODENOSCOPY (EGD);  Surgeon: Jenel Lucks, MD;  Location: [facility];  Service: Gastroenterology;  Laterality: N/A;   SKIN CANCER EXCISION  2015   TONSILLECTOMY     WISDOM TOOTH EXTRACTION     nausea post op    Social History   Socioeconomic History   Marital status: Divorced    Spouse name: Not on file   Number of children: Not on file   Years of education: Not on file   Highest education level: Not on file  Occupational History   Not on file  Tobacco Use   Smoking status: Never   Smokeless tobacco: Never  Vaping Use   Vaping Use: Never used  Substance and Sexual Activity   Alcohol use: Yes    Alcohol/week: 10.0 standard drinks of alcohol    Types: 10 Glasses of wine per week   Drug use: Yes    Types: Other-see comments, "Crack" cocaine, Cocaine, Marijuana    Comment: current marijuana use  twice a month    Sexual activity: Not on file    Comment: oxycontin  Other  Topics Concern   Not on file  Social History Narrative   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    Social Determinants of Health   Financial Resource Strain: Low Risk  (10/16/2021)   Overall Financial Resource Strain (CARDIA)    Difficulty of Paying Living Expenses: Not hard at all  Food Insecurity: No Food Insecurity (10/16/2021)   Hunger Vital Sign    Worried About Running Out of Food in the Last Year: Never true    Ran Out of Food in the Last Year: Never true  Transportation Needs: No Transportation Needs (10/16/2021)   PRAPARE - Administrator, Civil Service (Medical): No    Lack of Transportation (Non-Medical): No  Physical Activity: Sufficiently Active (10/16/2021)   Exercise Vital Sign    Days of Exercise per Week: 3 days    Minutes of Exercise per Session: 60 min  Stress: No Stress Concern Present (10/16/2021)   [employer] of Occupational Health - Occupational Stress Questionnaire    Feeling of Stress : Not at all  Social Connections: Socially Isolated (10/16/2021)   Social Connection and Isolation Panel [NHANES]    Frequency of Communication with Friends and  Family: More than three times a week    Frequency of Social Gatherings with Friends and Family: More than three times a week    Attends Religious Services: Never    Database administrator or Organizations: No    Attends Banker Meetings: Never    Marital Status: Divorced  Catering manager Violence: Not At Risk (10/16/2021)   Humiliation, Afraid, Rape, and Kick questionnaire    Fear of Current or Ex-Partner: No    Emotionally Abused: No    Physically Abused: No    Sexually Abused: No    Family History  Problem Relation Age of Onset   Breast cancer Mother        Died at 66   Lung cancer Father    Prostate cancer Father        Died at 39    Colon cancer Paternal Grandmother    Esophageal cancer Neg Hx    Rectal cancer Neg Hx     Stomach cancer Neg Hx    Colon polyps Neg Hx     Current Outpatient Medications on File Prior to Visit  Medication Sig Dispense Refill   amLODipine (NORVASC) 10 MG tablet TAKE 1 TABLET BY MOUTH DAILY 90 tablet 1   cyclobenzaprine (FLEXERIL) 5 MG tablet Take 1 tablet (5 mg total) by mouth at bedtime. 30 tablet 0   DULoxetine (CYMBALTA) 20 MG capsule Take 1 capsule (20 mg total) by mouth 2 (two) times daily. 60 capsule 1   gabapentin (NEURONTIN) 300 MG capsule TAKE 1 CAPSULE(300 MG) BY MOUTH TWICE DAILY 180 capsule 0   ibuprofen (ADVIL) 200 MG tablet Take 200 mg by mouth every 6 (six) hours as needed for fever or mild pain.     ibuprofen (ADVIL) 600 MG tablet Take 600 mg by mouth every 4 (four) hours as needed.     lisinopril (ZESTRIL) 40 MG tablet TAKE 1 TABLET BY MOUTH EVERY DAY 90 tablet 0   meloxicam (MOBIC) 15 MG tablet TAKE 1 TABLET(15 MG) BY MOUTH DAILY 90 tablet 1   methocarbamol (ROBAXIN) 500 MG tablet Take 500 mg by mouth every 8 (eight) hours as needed.     Multiple Vitamin (MULTIVITAMIN ADULT PO) Take 1,500 mcg by mouth daily.     QUEtiapine (SEROQUEL) 200 MG tablet Take 1 tablet (200 mg total) by mouth at bedtime. 90 tablet 2   sildenafil (VIAGRA) 50 MG tablet Take 50 mg by mouth as needed for erectile dysfunction.     tamsulosin (FLOMAX) 0.4 MG CAPS capsule Take 2 capsules (0.8 mg total) by mouth daily after supper. 60 capsule 5   atorvastatin (LIPITOR) 40 MG tablet TAKE 1 TABLET BY MOUTH DAILY (Patient not taking: Reported on 10/21/2022) 90 tablet 3   TURMERIC CURCUMIN PO Take 1,500 mg by mouth daily. (Patient not taking: Reported on 10/21/2022)     Current Facility-Administered Medications on File Prior to Visit  Medication Dose Route Frequency Provider Last Rate Last Admin   lidocaine (XYLOCAINE) 1 % (with pres) injection 4 mL  4 mL Other Once Kirsteins, Victorino Sparrow, MD        No Known Allergies     Physical Exam There were no vitals filed for this visit. Estimated body  mass index is 25.92 kg/m as calculated from the following:   Height as of 09/21/22: 5' 8.5" (1.74 m).   Weight as of this encounter: 173 lb (78.5 kg).  EKG (optional): deferred due to virtual visit  GENERAL: alert,  oriented, no acute distress detected; full vision exam deferred due to pandemic and/or virtual encounter  PSYCH/NEURO: pleasant and cooperative, no obvious depression or anxiety, speech and thought processing grossly intact, Cognitive function grossly intact  Flowsheet Row Video Visit from 10/21/2022 in [facility]  PHQ-9 Total Score 0           10/21/2022    4:15 PM 09/21/2022    9:28 AM 07/23/2022    2:11 PM 05/25/2022    3:02 PM 04/13/2022   10:17 AM  Depression screen PHQ 2/9  Decreased Interest 0 1 0 1 1  Down, Depressed, Hopeless 0 1 0 1 1  PHQ - 2 Score 0 2 0 2 2  Altered sleeping 0    1  Tired, decreased energy 0    1  Change in appetite 0    0  Feeling bad or failure about yourself  0    1  Trouble concentrating 0    1  Moving slowly or fidgety/restless 0    0  Suicidal thoughts 0      PHQ-9 Score 0    6  Difficult doing work/chores Not difficult at all    Somewhat difficult       04/13/2022   10:28 AM 05/25/2022    3:02 PM 07/23/2022    2:11 PM 09/21/2022    9:29 AM 10/21/2022    4:15 PM  Fall Risk  Falls in the past year? 0 0 0 0 0  Was there an injury with Fall?   0 0 0  Fall Risk Category Calculator   0 0 0  Patient at Risk for Falls Due to     No Fall Risks  Fall risk Follow up     Falls evaluation completed     SUMMARY AND PLAN:  Encounter for Medicare annual wellness exam   Discussed applicable health maintenance/preventive health measures and advised and referred or ordered per patient preferences: -reports sees urologist for follow up q 6 months -reviewed all vaccines due and recommendations, he know can get at pharmacy Health Maintenance  Topic Date Due   Zoster Vaccines- Shingrix (1 of 2) Never  done   COVID-19 Vaccine (3 - Pfizer risk series) 10/31/2019   Pneumonia Vaccine 45+ Years old (2 of 2 - PPSV23 or PCV20) 03/27/2020   DTaP/Tdap/Td (2 - Td or Tdap) 05/14/2022   INFLUENZA VACCINE  01/27/2023   Medicare Annual Wellness (AWV)  10/21/2023   COLONOSCOPY (Pts 45-35yrs Insurance coverage will need to be confirmed)  07/12/2027   Hepatitis C Screening  Completed   HPV VACCINES  Aged [employer] and counseling on the following was provided based on the above review of health and a plan/checklist for the patient, along with additional information discussed, was provided for the patient in the patient instructions :  -Advised on importance of completing advanced directives, discussed options for completing and provided information in patient instructions as well -Provided counseling and plan for difficulty hearing - he plans to see audiologist at some point, but reports not high on his list to do currently -Advised and counseled on a healthy lifestyle - including the importance of a healthy diet, regular physical activity -Reviewed patient's current diet. Advised and counseled on a whole foods based healthy diet. A summary of a healthy diet was provided in the Patient Instructions. Congratulated on his efforts and success!!! -reviewed patient's current physical activity level and discussed exercise guidelines for adults. Discussed  community resources and ideas for safe exercise at home to assist in meeting exercise guideline recommendations in a safe and healthy way. Encouraged to work up to meeting guidelines for adults.  -Advise yearly dental visits at minimum and regular eye exams -Advised and counseled on alcohol safe limits, risks  Follow up: see patient instructions   Patient Instructions  I really enjoyed getting to talk with you today! I am available on Tuesdays and Thursdays for virtual visits if you have any questions or concerns, or if I can be of any further assistance.    CHECKLIST FROM ANNUAL WELLNESS VISIT:  -Follow up (please call to schedule if not scheduled after visit):   -yearly for annual wellness visit with primary care office  Here is a list of your preventive care/health maintenance measures and the plan for each if any are due:  PLAN For any measures below that may be due:  -can get the vaccines at the pharmacy  Health Maintenance  Topic Date Due   Zoster Vaccines- Shingrix (1 of 2) Never done   COVID-19 Vaccine (3 - Pfizer risk series) 10/31/2019   Pneumonia Vaccine 87+ Years old (2 of 2 - PPSV23 or PCV20) 03/27/2020   DTaP/Tdap/Td (2 - Td or Tdap) 05/14/2022   INFLUENZA VACCINE  01/27/2023   Medicare Annual Wellness (AWV)  10/21/2023   COLONOSCOPY (Pts 45-65yrs Insurance coverage will need to be confirmed)  07/12/2027   Hepatitis C Screening  Completed   HPV VACCINES  Aged Out    -See a dentist at least yearly  -Get your eyes checked and then per your eye specialist's recommendations  -Other issues addressed today:   -I have included below further information regarding a healthy whole foods based diet, physical activity guidelines for adults, stress management and opportunities for social connections. I hope you find this information useful.   -----------------------------------------------------------------------------------------------------------------------------------------------------------------------------------------------------------------------------------------------------------  NUTRITION: -eat real food: lots of colorful vegetables (half the plate) and fruits -5-7 servings of vegetables and fruits per day (fresh or steamed is best), exp. 2 servings of vegetables with lunch and dinner and 2 servings of fruit per day. Berries and greens such as kale and collards are great choices.  -consume on a regular basis: whole grains (make sure first ingredient on label contains the word "whole"), fresh fruits, fish, nuts,  seeds, healthy oils (such as olive oil, avocado oil, grape seed oil) -may eat small amounts of dairy and lean meat on occasion, but avoid processed meats such as ham, bacon, lunch meat, etc. -drink water -try to avoid fast food and pre-packaged foods, processed meat -most experts advise limiting sodium to < 2300mg  per day, should limit further is any chronic conditions such as high blood pressure, heart disease, diabetes, etc. The American Heart Association advised that < 1500mg  is is ideal -try to avoid foods that contain any ingredients with names you do not recognize  -try to avoid sugar/sweets (except for the natural sugar that occurs in fresh fruit) -try to avoid sweet drinks -try to avoid white rice, white bread, pasta (unless whole grain), white or yellow potatoes  EXERCISE GUIDELINES FOR ADULTS: -if you wish to increase your physical activity, do so gradually and with the approval of your doctor -STOP and seek medical care immediately if you have any chest pain, chest discomfort or trouble breathing when starting or increasing exercise  -move and stretch your body, legs, feet and arms when sitting for long periods -Physical activity guidelines for optimal health in adults: -least 150  minutes per week of aerobic exercise (can talk, but not sing) once approved by your doctor, 20-30 minutes of sustained activity or two 10 minute episodes of sustained activity every day.  -resistance training at least 2 days per week if approved by your doctor -balance exercises 3+ days per week:   Stand somewhere where you have something sturdy to hold onto if you lose balance.    1) lift up on toes, start with 5x per day and work up to 20x   2) stand and lift on leg straight out to the side so that foot is a few inches of the floor, start with 5x each side and work up to 20x each side   3) stand on one foot, start with 5 seconds each side and work up to 20 seconds on each side  If you need ideas or help  with getting more active:  -Silver sneakers https://tools.silversneakers.com  -Walk with a Doc: http://www.duncan-williams.com/  -try to include resistance (weight lifting/strength building) and balance exercises twice per week: or the following link for ideas: http://castillo-powell.com/  BuyDucts.dk  STRESS MANAGEMENT: -can try meditating, or just sitting quietly with deep breathing while intentionally relaxing all parts of your body for 5 minutes daily -if you need further help with stress, anxiety or depression please follow up with your primary doctor or contact the wonderful folks at [facility]: [phone]  SOCIAL CONNECTIONS: -options in [place] if you wish to engage in more social and exercise related activities:  -Silver sneakers https://tools.silversneakers.com  -Walk with a Doc: http://www.duncan-williams.com/  -Check out the [facility] Active Adults 50+ section on the [place] of [employer] (hiking clubs, book clubs, cards and games, chess, exercise classes, aquatic classes and much more) - see the website for details: https://www.[place].gov/departments/parks-recreation/active-adults50  -YouTube has lots of exercise videos for different ages and abilities as well  -[facility] (a variety of indoor and outdoor inperson activities for adults). [phone]. [address]  -Virtual Online Classes (a variety of topics): see seniorplanet.org or call [phone]  -consider volunteering at a school, hospice center, church, senior center or elsewhere    ADVANCED HEALTHCARE DIRECTIVES:  Everyone should have advanced health care directives in place. This is so that you get the care you want, should you ever be in a situation where you are unable to make your own medical decisions.   From the [place] Advanced Directive Website: "Advance Health Care  Directives are legal documents in which you give written instructions about your health care if, in the future, you cannot speak for yourself.   A health care power of attorney allows you to name a person you trust to make your health care decisions if you cannot make them yourself. A declaration of a desire for a natural death (or living will) is document, which states that you desire not to have your life prolonged by extraordinary measures if you have a terminal or incurable illness or if you are in a vegetative state. An advance instruction for mental health treatment makes a declaration of instructions, information and preferences regarding your mental health treatment. It also states that you are aware that the advance instruction authorizes a mental health treatment provider to act according to your wishes. It may also outline your consent or refusal of mental health treatment. A declaration of an anatomical gift allows anyone over the age of 15 to make a gift by will, organ donor card or other document."   Please see the following website  or an elder Sales executive for forms, FAQs and for completion of advanced directives: [place] TEFL teacher Health Care Directives Advance Health Care Directives (http://guzman.com/)  Or copy and paste the following to your web browser: PoshChat.fi         Terressa Koyanagi, DO

## 2022-10-21 NOTE — Patient Instructions (Addendum)
I really enjoyed getting to talk with you today! I am available on Tuesdays and Thursdays for virtual visits if you have any questions or concerns, or if I can be of any further assistance.   CHECKLIST FROM ANNUAL WELLNESS VISIT:  -Follow up (please call to schedule if not scheduled after visit):   -yearly for annual wellness visit with primary care office  Here is a list of your preventive care/health maintenance measures and the plan for each if any are due:  PLAN For any measures below that may be due:  -can get the vaccines at the pharmacy  Health Maintenance  Topic Date Due   Zoster Vaccines- Shingrix (1 of 2) Never done   COVID-19 Vaccine (3 - Pfizer risk series) 10/31/2019   Pneumonia Vaccine 55+ Years old (2 of 2 - PPSV23 or PCV20) 03/27/2020   DTaP/Tdap/Td (2 - Td or Tdap) 05/14/2022   INFLUENZA VACCINE  01/27/2023   Medicare Annual Wellness (AWV)  10/21/2023   COLONOSCOPY (Pts 45-8yrs Insurance coverage will need to be confirmed)  07/12/2027   Hepatitis C Screening  Completed   HPV VACCINES  Aged Out    -See a dentist at least yearly  -Get your eyes checked and then per your eye specialist's recommendations  -Other issues addressed today:   -I have included below further information regarding a healthy whole foods based diet, physical activity guidelines for adults, stress management and opportunities for social connections. I hope you find this information useful.   -----------------------------------------------------------------------------------------------------------------------------------------------------------------------------------------------------------------------------------------------------------  NUTRITION: -eat real food: lots of colorful vegetables (half the plate) and fruits -5-7 servings of vegetables and fruits per day (fresh or steamed is best), exp. 2 servings of vegetables with lunch and dinner and 2 servings of fruit per day. Berries and  greens such as kale and collards are great choices.  -consume on a regular basis: whole grains (make sure first ingredient on label contains the word "whole"), fresh fruits, fish, nuts, seeds, healthy oils (such as olive oil, avocado oil, grape seed oil) -may eat small amounts of dairy and lean meat on occasion, but avoid processed meats such as ham, bacon, lunch meat, etc. -drink water -try to avoid fast food and pre-packaged foods, processed meat -most experts advise limiting sodium to <  per day, should limit further is any chronic conditions such as high blood pressure, heart disease, diabetes, etc. The American Heart Association advised that <  is is ideal -try to avoid foods that contain any ingredients with names you do not recognize  -try to avoid sugar/sweets (except for the natural sugar that occurs in fresh fruit) -try to avoid sweet drinks -try to avoid white rice, white bread, pasta (unless whole grain), white or yellow potatoes  EXERCISE GUIDELINES FOR ADULTS: -if you wish to increase your physical activity, do so gradually and with the approval of your doctor -STOP and seek medical care immediately if you have any chest pain, chest discomfort or trouble breathing when starting or increasing exercise  -move and stretch your body, legs, feet and arms when sitting for long periods -Physical activity guidelines for optimal health in adults: -least 150 minutes per week of aerobic exercise (can talk, but not sing) once approved by your doctor, 20-30 minutes of sustained activity or two 10 minute episodes of sustained activity every day.  -resistance training at least 2 days per week if approved by your doctor -balance exercises 3+ days per week:   Stand somewhere where you have something sturdy to hold  onto if you lose balance.    1) lift up on toes, start with 5x per day and work up to 20x   2) stand and lift on leg straight out to the side so that foot is a few inches of  the floor, start with 5x each side and work up to 20x each side   3) stand on one foot, start with 5 seconds each side and work up to 20 seconds on each side  If you need ideas or help with getting more active:  -Silver sneakers https://tools.silversneakers.com  -Walk with a Doc: http://www.duncan-williams.com/  -try to include resistance (weight lifting/strength building) and balance exercises twice per week: or the following link for ideas: http://castillo-powell.com/  BuyDucts.dk  STRESS MANAGEMENT: -can try meditating, or just sitting quietly with deep breathing while intentionally relaxing all parts of your body for 5 minutes daily -if you need further help with stress, anxiety or depression please follow up with your primary doctor or contact the wonderful folks at [facility]: [phone]  SOCIAL CONNECTIONS: -options in [place] if you wish to engage in more social and exercise related activities:  -Silver sneakers https://tools.silversneakers.com  -Walk with a Doc: http://www.duncan-williams.com/  -Check out the [facility] Active Adults 50+ section on the [place] [employer] (hiking clubs, book clubs, cards and games, chess, exercise classes, aquatic classes and much more) - see the website for details: https://www.[place].gov/departments/parks-recreation/active-adults50  -YouTube has lots of exercise videos for different ages and abilities as well  -[facility] (a variety of indoor and outdoor inperson activities for adults). [phone]. [address]  -Virtual Online Classes (a variety of topics): see seniorplanet.org or call [phone]  -consider volunteering at a school, hospice center, church, senior center or elsewhere    ADVANCED HEALTHCARE DIRECTIVES:  Everyone should have advanced health care directives in place. This is so  that you get the care you want, should you ever be in a situation where you are unable to make your own medical decisions.   From the [place] Advanced Directive Website: "Advance Health Care Directives are legal documents in which you give written instructions about your health care if, in the future, you cannot speak for yourself.   A health care power of attorney allows you to name a person you trust to make your health care decisions if you cannot make them yourself. A declaration of a desire for a natural death (or living will) is document, which states that you desire not to have your life prolonged by extraordinary measures if you have a terminal or incurable illness or if you are in a vegetative state. An advance instruction for mental health treatment makes a declaration of instructions, information and preferences regarding your mental health treatment. It also states that you are aware that the advance instruction authorizes a mental health treatment provider to act according to your wishes. It may also outline your consent or refusal of mental health treatment. A declaration of an anatomical gift allows anyone over the age of 71 to make a gift by will, organ donor card or other document."   Please see the following website or an elder law attorney for forms, FAQs and for completion of advanced directives: [place] Health Care Directives Advance Health Care Directives (http://guzman.com/)  Or copy and paste the following to your web browser: PoshChat.fi

## 2022-11-04 ENCOUNTER — Other Ambulatory Visit: Payer: Self-pay | Admitting: Physical Medicine & Rehabilitation

## 2022-11-08 ENCOUNTER — Telehealth: Payer: Self-pay | Admitting: *Deleted

## 2022-11-08 NOTE — Telephone Encounter (Signed)
Patient calling about duloxetine 20 mg. The directions and quantity were not adjusted before refilling. Patient just picked of #30 which is a 30 day supply. Please adjust and sent new script.   Per Jan office visit: 2.  Widespread body pain with hypersensitivity, continue duloxetine 20 mg twice daily have adjusted his prescription.

## 2022-11-09 MED ORDER — DULOXETINE HCL 20 MG PO CPEP: ORAL_CAPSULE | ORAL | 1 refills | Status: DC

## 2022-12-06 ENCOUNTER — Other Ambulatory Visit: Payer: Self-pay | Admitting: Physical Medicine & Rehabilitation

## 2023-01-25 ENCOUNTER — Other Ambulatory Visit: Payer: Self-pay | Admitting: Physical Medicine & Rehabilitation

## 2023-02-02 ENCOUNTER — Encounter: Payer: Self-pay | Admitting: Adult Health

## 2023-02-02 ENCOUNTER — Ambulatory Visit (INDEPENDENT_AMBULATORY_CARE_PROVIDER_SITE_OTHER): Payer: Medicare Other | Admitting: Adult Health

## 2023-02-02 VITALS — BP 180/90 | HR 62 | Temp 98.7°F | Ht 68.5 in | Wt 178.0 lb

## 2023-02-02 DIAGNOSIS — M7918 Myalgia, other site: Secondary | ICD-10-CM | POA: Diagnosis not present

## 2023-02-02 DIAGNOSIS — E782 Mixed hyperlipidemia: Secondary | ICD-10-CM | POA: Diagnosis not present

## 2023-02-02 DIAGNOSIS — F5101 Primary insomnia: Secondary | ICD-10-CM

## 2023-02-02 DIAGNOSIS — G629 Polyneuropathy, unspecified: Secondary | ICD-10-CM | POA: Diagnosis not present

## 2023-02-02 DIAGNOSIS — F324 Major depressive disorder, single episode, in partial remission: Secondary | ICD-10-CM

## 2023-02-02 DIAGNOSIS — I10 Essential (primary) hypertension: Secondary | ICD-10-CM | POA: Diagnosis not present

## 2023-02-02 MED ORDER — ATORVASTATIN CALCIUM 40 MG PO TABS: ORAL_TABLET | ORAL | 0 refills | Status: DC

## 2023-02-02 MED ORDER — AMLODIPINE BESYLATE 10 MG PO TABS: 10.0000 mg | ORAL_TABLET | Freq: Every day | ORAL | 0 refills | Status: DC

## 2023-02-02 MED ORDER — CYCLOBENZAPRINE HCL 5 MG PO TABS: 5.0000 mg | ORAL_TABLET | Freq: Every day | ORAL | 0 refills | Status: DC

## 2023-02-02 MED ORDER — GABAPENTIN 300 MG PO CAPS: 300.0000 mg | ORAL_CAPSULE | Freq: Two times a day (BID) | ORAL | 0 refills | Status: DC

## 2023-02-02 MED ORDER — LISINOPRIL 40 MG PO TABS: 40.0000 mg | ORAL_TABLET | Freq: Every day | ORAL | 0 refills | Status: DC

## 2023-02-02 MED ORDER — QUETIAPINE FUMARATE 200 MG PO TABS: 200.0000 mg | ORAL_TABLET | Freq: Every day | ORAL | 0 refills | Status: DC

## 2023-02-02 NOTE — Progress Notes (Signed)
Subjective:    Patient ID: Frank [name], male    DOB: [birth year], 72 y.o.   MRN: [id number]  HPI He is a 72 year old male who  has a past medical history of Anxiety, Depression, Drug addiction (HCC), High cholesterol, Hypertensive urgency, Prostate cancer (HCC), and Tinnitus.  He presents to the office today in the need of medication refills. He is going to schedule his CPE for a later date  For blood pressure control he is prescribed Norvasc 10 mg and lisinopril 40 mg daily.  His blood pressure is elevated in the office today, he reports that he has not taken his blood pressure medication for a few days more than likely.  He also needs a refill of Lipitor 40 mg daily due to history of hyperlipidemia.  For depression and insomnia he is taking Seroquel 100 mg nightly.  He feels as though the symptoms are well-controlled on this dose.  Furthermore he has a history of chronic cervical spine pain in which she has seen neurosurgery in the past and had multiple epidural injections done which did not help.  October 2023 he started seeing physical medicine and rehab, has been diagnosed with myofascial pain dysfunction syndrome and has been prescribed Cymbalta milligrams daily as well as Flexeril 5 mg.  He has also had some lidocaine trigger point injections done.  He does not really feel as though these medications help significantly but they may help to some degree.  Unfortunately, his insurance sees his ointments with physical medicine and rehab at the hospital visit and charged him 100s of dollars per visit.  He cannot afford to go back for the next few months and is wondering if I can prescribe him a short course of Flexeril.   He also takes gabapentin 300 mg twice daily for neuropathy and feels as though this medication is beneficial to decrease symptoms   Review of Systems  See HPI  Past Medical History:  Diagnosis Date   Anxiety    Depression    Drug addiction (HCC)    High cholesterol     Hypertensive urgency    Tinnitus     Social History   Socioeconomic History   Marital status: Divorced    Spouse name: Not on file   Number of children: Not on file   Years of education: Not on file   Highest education level: Not on file  Occupational History   Not on file  Tobacco Use   Smoking status: Never   Smokeless tobacco: Never  Vaping Use   Vaping status: Never Used  Substance and Sexual Activity   Alcohol use: Yes    Alcohol/week: 10.0 standard drinks of alcohol    Types: 10 Glasses of wine per week   Drug use: Yes    Types: Other-see comments, "Crack" cocaine, Cocaine, Marijuana    Comment: current marijuana use  twice a month    Sexual activity: Not on file    Comment: oxycontin  Other Topics Concern   Not on file  Social History Narrative   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    Social Determinants of Health   Financial Resource Strain: Low Risk  (10/16/2021)   Overall Financial Resource Strain (CARDIA)    Difficulty of Paying Living Expenses: Not hard at all  Food Insecurity: No Food Insecurity (10/16/2021)   Hunger Vital Sign    Worried  About Running Out of Food in the Last Year: Never true    Ran Out of Food in the Last Year: Never true  Transportation Needs: No Transportation Needs (10/16/2021)   PRAPARE - Administrator, Civil Service (Medical): No    Lack of Transportation (Non-Medical): No  Physical Activity: Sufficiently Active (10/16/2021)   Exercise Vital Sign    Days of Exercise per Week: 3 days    Minutes of Exercise per Session: 60 min  Stress: No Stress Concern Present (10/16/2021)   [employer] of Occupational Health - Occupational Stress Questionnaire    Feeling of Stress : Not at all  Social Connections: Socially Isolated (10/16/2021)   Social Connection and Isolation Panel [NHANES]    Frequency of Communication with Friends and Family: More than three times  a week    Frequency of Social Gatherings with Friends and Family: More than three times a week    Attends Religious Services: Never    Database administrator or Organizations: No    Attends Banker Meetings: Never    Marital Status: Divorced  Catering manager Violence: Not At Risk (10/16/2021)   Humiliation, Afraid, Rape, and Kick questionnaire    Fear of Current or Ex-Partner: No    Emotionally Abused: No    Physically Abused: No    Sexually Abused: No    Past Surgical History:  Procedure Laterality Date   ESOPHAGOGASTRODUODENOSCOPY N/A 11/04/2021   Procedure: ESOPHAGOGASTRODUODENOSCOPY (EGD);  Surgeon: Jenel Lucks, MD;  Location: [facility];  Service: Gastroenterology;  Laterality: N/A;   SKIN CANCER EXCISION  2015   TONSILLECTOMY     WISDOM TOOTH EXTRACTION     nausea post op    Family History  Problem Relation Age of Onset   Breast cancer Mother        Died at 18   Lung cancer Father    Prostate cancer Father        Died at 41    Colon cancer Paternal Grandmother    Esophageal cancer Neg Hx    Rectal cancer Neg Hx    Stomach cancer Neg Hx    Colon polyps Neg Hx     No Known Allergies  Current Outpatient Medications on File Prior to Visit  Medication Sig Dispense Refill   amLODipine (NORVASC) 10 MG tablet TAKE 1 TABLET BY MOUTH DAILY 90 tablet 1   atorvastatin (LIPITOR) 40 MG tablet TAKE 1 TABLET BY MOUTH DAILY 90 tablet 3   cyclobenzaprine (FLEXERIL) 5 MG tablet TAKE 1 TABLET(5 MG) BY MOUTH AT BEDTIME 30 tablet 0   DULoxetine (CYMBALTA) 20 MG capsule TAKE 1 CAPSULE(20 MG) BY MOUTH TWICE DAILY 60 capsule 1   gabapentin (NEURONTIN) 300 MG capsule TAKE 1 CAPSULE(300 MG) BY MOUTH TWICE DAILY 180 capsule 0   ibuprofen (ADVIL) 200 MG tablet Take 200 mg by mouth every 6 (six) hours as needed for fever or mild pain.     ibuprofen (ADVIL) 600 MG tablet Take 600 mg by mouth every 4 (four) hours as needed.     lisinopril (ZESTRIL) 40 MG tablet TAKE 1  TABLET BY MOUTH EVERY DAY 90 tablet 0   meloxicam (MOBIC) 15 MG tablet TAKE 1 TABLET(15 MG) BY MOUTH DAILY 90 tablet 1   Multiple Vitamin (MULTIVITAMIN ADULT PO) Take 1,500 mcg by mouth daily.     QUEtiapine (SEROQUEL) 200 MG tablet Take 1 tablet (200 mg total) by mouth at bedtime. 90 tablet 2  sildenafil (VIAGRA) 50 MG tablet Take 50 mg by mouth as needed for erectile dysfunction.     tamsulosin (FLOMAX) 0.4 MG CAPS capsule Take 2 capsules (0.8 mg total) by mouth daily after supper. 60 capsule 5   TURMERIC CURCUMIN PO Take 1,500 mg by mouth daily.     Current Facility-Administered Medications on File Prior to Visit  Medication Dose Route Frequency Provider Last Rate Last Admin   lidocaine (XYLOCAINE) 1 % (with pres) injection 4 mL  4 mL Other Once Kirsteins, Victorino Sparrow, MD        BP (!) 180/90   Pulse 62   Temp 98.7 F (37.1 C) (Oral)   Ht 5' 8.5" (1.74 m)   Wt 178 lb (80.7 kg)   SpO2 97%   BMI 26.67 kg/m       Objective:   Physical Exam Nursing note reviewed.  Constitutional:      Appearance: Normal appearance.  Cardiovascular:     Rate and Rhythm: Normal rate and regular rhythm.     Pulses: Normal pulses.     Heart sounds: Normal heart sounds.  Pulmonary:     Breath sounds: Normal breath sounds.  Skin:    General: Skin is warm and dry.  Neurological:     General: No focal deficit present.     Mental Status: He is alert and oriented to person, place, and time.  Psychiatric:        Mood and Affect: Mood normal.        Behavior: Behavior normal.        Thought Content: Thought content normal.        Judgment: Judgment normal.       Assessment & Plan:  1. Primary insomnia - Advised I will fill his medications for 90 days which gives him plenty of time to have his CPE done  - QUEtiapine (SEROQUEL) 200 MG tablet; Take 1 tablet (200 mg total) by mouth at bedtime.  Dispense: 90 tablet; Refill: 0  2. Essential hypertension - BP elevated, needs to take medication daily.   - amLODipine (NORVASC) 10 MG tablet; Take 1 tablet (10 mg total) by mouth daily.  Dispense: 90 tablet; Refill: 0 - lisinopril (ZESTRIL) 40 MG tablet; Take 1 tablet (40 mg total) by mouth daily.  Dispense: 90 tablet; Refill: 0  3. Mixed hyperlipidemia  - atorvastatin (LIPITOR) 40 MG tablet; Take 1 tablet by mouth daily.  Dispense: 90 tablet; Refill: 0  4. Myofascial pain dysfunction syndrome  - gabapentin (NEURONTIN) 300 MG capsule; Take 1 capsule (300 mg total) by mouth 2 (two) times daily.  Dispense: 180 capsule; Refill: 0 - cyclobenzaprine (FLEXERIL) 5 MG tablet; Take 1 tablet (5 mg total) by mouth at bedtime.  Dispense: 30 tablet; Refill: 0  5. Depression, major, single episode, in partial remission (HCC)  - QUEtiapine (SEROQUEL) 200 MG tablet; Take 1 tablet (200 mg total) by mouth at bedtime.  Dispense: 90 tablet; Refill: 0  6. Neuropathy  - gabapentin (NEURONTIN) 300 MG capsule; Take 1 capsule (300 mg total) by mouth 2 (two) times daily.  Dispense: 180 capsule; Refill: 0  Shirline Frees, NP

## 2023-02-10 ENCOUNTER — Encounter (INDEPENDENT_AMBULATORY_CARE_PROVIDER_SITE_OTHER): Payer: Self-pay

## 2023-03-02 ENCOUNTER — Ambulatory Visit (INDEPENDENT_AMBULATORY_CARE_PROVIDER_SITE_OTHER): Payer: Medicare Other | Admitting: Adult Health

## 2023-03-02 VITALS — BP 128/80 | HR 75 | Temp 98.1°F | Ht 68.0 in | Wt 181.0 lb

## 2023-03-02 DIAGNOSIS — F324 Major depressive disorder, single episode, in partial remission: Secondary | ICD-10-CM | POA: Diagnosis not present

## 2023-03-02 DIAGNOSIS — M7918 Myalgia, other site: Secondary | ICD-10-CM

## 2023-03-02 DIAGNOSIS — F5101 Primary insomnia: Secondary | ICD-10-CM | POA: Diagnosis not present

## 2023-03-02 DIAGNOSIS — G629 Polyneuropathy, unspecified: Secondary | ICD-10-CM | POA: Diagnosis not present

## 2023-03-02 DIAGNOSIS — E782 Mixed hyperlipidemia: Secondary | ICD-10-CM | POA: Diagnosis not present

## 2023-03-02 DIAGNOSIS — Z125 Encounter for screening for malignant neoplasm of prostate: Secondary | ICD-10-CM

## 2023-03-02 DIAGNOSIS — H9193 Unspecified hearing loss, bilateral: Secondary | ICD-10-CM

## 2023-03-02 DIAGNOSIS — Z0001 Encounter for general adult medical examination with abnormal findings: Secondary | ICD-10-CM | POA: Diagnosis not present

## 2023-03-02 DIAGNOSIS — Z0000 Encounter for general adult medical examination without abnormal findings: Secondary | ICD-10-CM

## 2023-03-02 DIAGNOSIS — I10 Essential (primary) hypertension: Secondary | ICD-10-CM | POA: Diagnosis not present

## 2023-03-02 LAB — LIPID PANEL
Cholesterol: 221 mg/dL — ABNORMAL HIGH (ref 0–200)
HDL: 53 mg/dL (ref >39.00)
LDL Cholesterol: 145 mg/dL — ABNORMAL HIGH (ref 0–99)
NonHDL: 168.43
Total CHOL/HDL Ratio: 4
Triglycerides: 119 mg/dL (ref 0.0–149.0)
VLDL: 23.8 mg/dL (ref 0.0–40.0)

## 2023-03-02 LAB — CBC
HCT: 45.5 % (ref 39.0–52.0)
Hemoglobin: 14.9 g/dL (ref 13.0–17.0)
MCHC: 32.8 g/dL (ref 30.0–36.0)
MCV: 90.9 fl (ref 78.0–100.0)
Platelets: 228 10*3/uL (ref 150.0–400.0)
RBC: 5 Mil/uL (ref 4.22–5.81)
RDW: 14 % (ref 11.5–15.5)
WBC: 6.2 10*3/uL (ref 4.0–10.5)

## 2023-03-02 LAB — COMPREHENSIVE METABOLIC PANEL
ALT: 26 U/L (ref 0–53)
AST: 19 U/L (ref 0–37)
Albumin: 4.3 g/dL (ref 3.5–5.2)
Alkaline Phosphatase: 86 U/L (ref 39–117)
BUN: 20 mg/dL (ref 6–23)
CO2: 27 meq/L (ref 19–32)
Calcium: 9.8 mg/dL (ref 8.4–10.5)
Chloride: 103 meq/L (ref 96–112)
Creatinine, Ser: 0.79 mg/dL (ref 0.40–1.50)
GFR: 90.45 mL/min (ref >60.00)
Glucose, Bld: 108 mg/dL — ABNORMAL HIGH (ref 70–99)
Potassium: 4.3 meq/L (ref 3.5–5.1)
Sodium: 137 meq/L (ref 135–145)
Total Bilirubin: 0.4 mg/dL (ref 0.2–1.2)
Total Protein: 6.8 g/dL (ref 6.0–8.3)

## 2023-03-02 LAB — HEMOGLOBIN A1C: Hgb A1c MFr Bld: 5.6 % (ref 4.6–6.5)

## 2023-03-02 LAB — TSH: TSH: 1.34 u[IU]/mL (ref 0.35–5.50)

## 2023-03-02 NOTE — Progress Notes (Signed)
Subjective:    Patient ID: Frank [name], male    DOB: [birth year], 72 y.o.   MRN: [id number]  HPI Patient presents for yearly preventative medicine examination. He is a pleasant 72 year old male who  has a past medical history of Anxiety, Depression, Drug addiction (HCC), High cholesterol, Hypertensive urgency, Prostate cancer (HCC), and Tinnitus.  Hypertension -managed with Norvasc 10 mg and lisinopril 40 mg daily.  He denies dizziness, lightheadedness, chest pain, shortness of breath BP Readings from Last 3 Encounters:  03/02/23 128/80  02/02/23 (!) 180/90  09/21/22 (!) 152/86   BPH/history of prostate cancer - managed by urology. Diagnosed with prostate cancer in 11/2020 a Currently prescribed Flomax 0.4 mg daily. Had prostate checked recently with a PSA of 0/60  Hyperlipidemia-takes Lipitor 40 mg daily. He stopped taking lipitor on his own to see if his chronic muscle pain would get better. Pain did not improve but he did not restart lipitor.  Lab Results   Lab Results  Component Value Date   CHOL 344 (H) 11/27/2021   HDL 53.10 11/27/2021   LDLCALC 132 (H) 07/02/2020   LDLDIRECT 247.0 11/27/2021   TRIG 202.0 (H) 11/27/2021   CHOLHDL 6 11/27/2021    Depression/Insomnia -  he is taking Seroquel 100 mg nightly.  He feels as though the symptoms are well-controlled on this dose.  Neuropathy - takes Gabapentin 300 mg BID - feels as though this medication decreases his symptoms   Furthermore he has a history of chronic cervical spine pain in which she has seen neurosurgery in the past and had multiple epidural injections done which did not help.  October 2023 he started seeing physical medicine and rehab, has been diagnosed with myofascial pain dysfunction syndrome and has been prescribed Cymbalta milligrams daily as well as Flexeril 5 mg.  He has also had some lidocaine trigger point injections done.  He does not really feel as though these medications help significantly but they may  help to some degree.  Unfortunately, his insurance sees his ointments with physical medicine and rehab at the hospital visit and charged him 100s of dollars per visit.  He cannot afford to go back for the next few months and is wondering if I can prescribe him a short course of Flexeril.  Hearing Loss - has history of but feels as though the last 6 months he has had worsening hearing. 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 He would like to have a hearing test done.   All immunizations and health maintenance protocols were reviewed with the patient and needed orders were placed.  Appropriate screening laboratory values were ordered for the patient including screening of hyperlipidemia, renal function and hepatic function. If indicated by BPH, a PSA was ordered.  Medication reconciliation,  past medical history, social history, problem list and allergies were reviewed in detail with the patient  Goals were established with regard to weight loss, exercise, and  diet in compliance with medications Wt Readings from Last 3 Encounters:  03/02/23 181 lb (82.1 kg)  02/02/23 178 lb (80.7 kg)  10/21/22 173 lb (78.5 kg)    Review of Systems  Constitutional: Negative.   HENT:  Positive for hearing loss.   Eyes: Negative.   Respiratory: Negative.    Cardiovascular: Negative.   Gastrointestinal: Negative.   Endocrine: Negative.   Genitourinary: Negative.   Musculoskeletal:  Positive for arthralgias and back pain.  Skin: Negative.   Allergic/Immunologic: Negative.   Neurological:  Positive for numbness.  Hematological: Negative.   Psychiatric/Behavioral: Negative.    All other systems reviewed and are negative.  Past Medical History:  Diagnosis Date   Anxiety    Depression    Drug addiction (HCC)    High cholesterol    Hypertensive urgency    Prostate cancer (HCC)    Tinnitus     Social History   Socioeconomic History   Marital status: Divorced    Spouse name: Not on file    Number of children: Not on file   Years of education: Not on file   Highest education level: Bachelor's degree (e.g., BA, AB, BS)  Occupational History   Not on file  Tobacco Use   Smoking status: Never   Smokeless tobacco: Never  Vaping Use   Vaping status: Never Used  Substance and Sexual Activity   Alcohol use: Yes    Alcohol/week: 10.0 standard drinks of alcohol    Types: 10 Glasses of wine per week   Drug use: Yes    Types: Other-see comments, "Crack" cocaine, Cocaine, Marijuana    Comment: current marijuana use  twice a month    Sexual activity: Not on file    Comment: oxycontin  Other Topics Concern   Not on file  Social History Narrative   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    Social Determinants of Health   Financial Resource Strain: Low Risk  (03/02/2023)   Overall Financial Resource Strain (CARDIA)    Difficulty of Paying Living Expenses: Not very hard  Food Insecurity: No Food Insecurity (03/02/2023)   Hunger Vital Sign    Worried About Running Out of Food in the Last Year: Never true    Ran Out of Food in the Last Year: Never true  Transportation Needs: No Transportation Needs (03/02/2023)   PRAPARE - Administrator, Civil Service (Medical): No    Lack of Transportation (Non-Medical): No  Physical Activity: Insufficiently Active (03/02/2023)   Exercise Vital Sign    Days of Exercise per Week: 3 days    Minutes of Exercise per Session: 20 min  Stress: Stress Concern Present (03/02/2023)   [employer] of Occupational Health - Occupational Stress Questionnaire    Feeling of Stress : To some extent  Social Connections: Socially Isolated (03/02/2023)   Social Connection and Isolation Panel [NHANES]    Frequency of Communication with Friends and Family: Three times a week    Frequency of Social Gatherings with Friends and Family: Once a week    Attends Religious Services: Never    Loss adjuster, chartered or Organizations: No    Attends Engineer, structural: Not on file    Marital Status: Divorced  Intimate Partner Violence: Not At Risk (10/16/2021)   Humiliation, Afraid, Rape, and Kick questionnaire    Fear of Current or Ex-Partner: No    Emotionally Abused: No    Physically Abused: No    Sexually Abused: No    Past Surgical History:  Procedure Laterality Date   ESOPHAGOGASTRODUODENOSCOPY N/A 11/04/2021   Procedure: ESOPHAGOGASTRODUODENOSCOPY (EGD);  Surgeon: Jenel Lucks, MD;  Location: [facility];  Service: Gastroenterology;  Laterality: N/A;   SKIN CANCER EXCISION  2015   TONSILLECTOMY     WISDOM TOOTH EXTRACTION     nausea post op    Family History  Problem Relation Age of Onset   Breast  cancer Mother        Died at 65   Lung cancer Father    Prostate cancer Father        Died at 32    Colon cancer Paternal Grandmother    Esophageal cancer Neg Hx    Rectal cancer Neg Hx    Stomach cancer Neg Hx    Colon polyps Neg Hx     No Known Allergies  Current Outpatient Medications on File Prior to Visit  Medication Sig Dispense Refill   amLODipine (NORVASC) 10 MG tablet Take 1 tablet (10 mg total) by mouth daily. 90 tablet 0   atorvastatin (LIPITOR) 40 MG tablet Take 1 tablet by mouth daily. 90 tablet 0   cyclobenzaprine (FLEXERIL) 5 MG tablet Take 1 tablet (5 mg total) by mouth at bedtime. 30 tablet 0   DULoxetine (CYMBALTA) 20 MG capsule TAKE 1 CAPSULE(20 MG) BY MOUTH TWICE DAILY 60 capsule 1   gabapentin (NEURONTIN) 300 MG capsule Take 1 capsule (300 mg total) by mouth 2 (two) times daily. 180 capsule 0   ibuprofen (ADVIL) 600 MG tablet Take 600 mg by mouth every 4 (four) hours as needed.     lisinopril (ZESTRIL) 40 MG tablet Take 1 tablet (40 mg total) by mouth daily. 90 tablet 0   Multiple Vitamin (MULTIVITAMIN ADULT PO) Take 1,500 mcg by mouth daily.     QUEtiapine (SEROQUEL) 200 MG tablet Take 1 tablet (200 mg total) by mouth at bedtime. 90  tablet 0   sildenafil (VIAGRA) 50 MG tablet Take 50 mg by mouth as needed for erectile dysfunction.     tamsulosin (FLOMAX) 0.4 MG CAPS capsule Take 2 capsules (0.8 mg total) by mouth daily after supper. 60 capsule 5   meloxicam (MOBIC) 15 MG tablet TAKE 1 TABLET(15 MG) BY MOUTH DAILY (Patient not taking: Reported on 03/02/2023) 90 tablet 1   Current Facility-Administered Medications on File Prior to Visit  Medication Dose Route Frequency Provider Last Rate Last Admin   lidocaine (XYLOCAINE) 1 % (with pres) injection 4 mL  4 mL Other Once Kirsteins, Victorino Sparrow, MD        BP 128/80   Pulse 75   Temp 98.1 F (36.7 C) (Oral)   Ht 5\' 8"  (1.727 m)   Wt 181 lb (82.1 kg)   SpO2 98%   BMI 27.52 kg/m       Objective:   Physical Exam Vitals and nursing note reviewed.  Constitutional:      General: He is not in acute distress.    Appearance: Normal appearance. He is not ill-appearing.  HENT:     Head: Normocephalic and atraumatic.     Right Ear: Tympanic membrane, ear canal and external ear normal. There is no impacted cerumen.     Left Ear: Tympanic membrane, ear canal and external ear normal. There is no impacted cerumen.     Nose: Nose normal. No congestion or rhinorrhea.     Mouth/Throat:     Mouth: Mucous membranes are moist.     Pharynx: Oropharynx is clear.  Eyes:     Extraocular Movements: Extraocular movements intact.     Conjunctiva/sclera: Conjunctivae normal.     Pupils: Pupils are equal, round, and reactive to light.  Neck:     Vascular: No carotid bruit.  Cardiovascular:     Rate and Rhythm: Normal rate and regular rhythm.     Pulses: Normal pulses.     Heart sounds: No murmur heard.  No friction rub. No gallop.  Pulmonary:     Effort: Pulmonary effort is normal.     Breath sounds: Normal breath sounds.  Abdominal:     General: Abdomen is flat. Bowel sounds are normal. There is no distension.     Palpations: Abdomen is soft. There is no mass.     Tenderness:  There is no abdominal tenderness. There is no guarding or rebound.     Hernia: No hernia is present.  Musculoskeletal:        General: Normal range of motion.     Cervical back: Normal range of motion and neck supple.  Lymphadenopathy:     Cervical: No cervical adenopathy.  Skin:    General: Skin is warm and dry.     Capillary Refill: Capillary refill takes less than 2 seconds.  Neurological:     General: No focal deficit present.     Mental Status: He is alert and oriented to person, place, and time.  Psychiatric:        Mood and Affect: Mood normal.        Behavior: Behavior normal.        Thought Content: Thought content normal.        Judgment: Judgment normal.        Assessment & Plan:  1. Routine general medical examination at a health care facility Today patient counseled on age appropriate routine health concerns for screening and prevention, each reviewed and up to date or declined. Immunizations reviewed and up to date or declined. Labs ordered and reviewed. Risk factors for depression reviewed and negative. Hearing function and visual acuity are intact. ADLs screened and addressed as needed. Functional ability and level of safety reviewed and appropriate. Education, counseling and referrals performed based on assessed risks today. Patient provided with a copy of personalized plan for preventive services. - Stay active and eat healthy  - Follow up in one year or sooner if needed - Pneumovax vaccination given today   2. Essential hypertension - Well controlled. No change in medication  - Lipid panel; Future - TSH; Future - CBC; Future - Comprehensive metabolic panel; Future - Hemoglobin A1c; Future  3. Mixed hyperlipidemia - Consider increase in statin  - Lipid panel; Future - TSH; Future - CBC; Future - Comprehensive metabolic panel; Future - Hemoglobin A1c; Future  4. Myofascial pain dysfunction syndrome - Follow up with pain management  - Lipid panel;  Future - TSH; Future - CBC; Future - Comprehensive metabolic panel; Future - Hemoglobin A1c; Future  5. Depression, major, single episode, in partial remission (HCC) - Continue with current therapy  - Lipid panel; Future - TSH; Future - CBC; Future - Comprehensive metabolic panel; Future - Hemoglobin A1c; Future  6. Neuropathy - Continue with gabapentin  - Lipid panel; Future - TSH; Future - CBC; Future - Comprehensive metabolic panel; Future  7. Primary insomnia - Continue with current medication  - Lipid panel; Future - TSH; Future - CBC; Future - Comprehensive metabolic panel; Future - Hemoglobin A1c; Future  8. Prostate cancer screening - Recently done at Urology   9. Bilateral hearing loss, unspecified hearing loss type  - Ambulatory referral to Audiology  Shirline Frees, NP

## 2023-03-02 NOTE — Patient Instructions (Signed)
It was great seeing you today   We will follow up with you regarding your lab work   Please let me know if you need anything   

## 2023-03-08 ENCOUNTER — Encounter: Payer: Self-pay | Admitting: Physical Medicine & Rehabilitation

## 2023-03-08 ENCOUNTER — Encounter: Payer: Medicare Other | Attending: Physical Medicine & Rehabilitation | Admitting: Physical Medicine & Rehabilitation

## 2023-03-08 VITALS — BP 142/75 | HR 77 | Ht 68.0 in | Wt 182.0 lb

## 2023-03-08 DIAGNOSIS — M797 Fibromyalgia: Secondary | ICD-10-CM | POA: Insufficient documentation

## 2023-03-08 DIAGNOSIS — M19012 Primary osteoarthritis, left shoulder: Secondary | ICD-10-CM | POA: Insufficient documentation

## 2023-03-08 DIAGNOSIS — M19042 Primary osteoarthritis, left hand: Secondary | ICD-10-CM | POA: Diagnosis not present

## 2023-03-08 DIAGNOSIS — M19011 Primary osteoarthritis, right shoulder: Secondary | ICD-10-CM | POA: Insufficient documentation

## 2023-03-08 DIAGNOSIS — M19041 Primary osteoarthritis, right hand: Secondary | ICD-10-CM | POA: Insufficient documentation

## 2023-03-08 NOTE — Progress Notes (Signed)
Subjective:    Patient ID: Frank [name], male    DOB: [birth year], 72 y.o.   MRN: [id number]  HPI  72 year old male with fibromyalgia syndrome as well as myofascial pain dysfunction syndrome.  He also has history of multilevel cervical spinal stenosis but no clear-cut evidence of radiculopathy.  He has undergone trigger point injections with some success in the past.  Last injection 09/21/2022.  In addition he takes Flexeril as needed for muscle spasm and about twice a week takes ibuprofen OTC  Increasing tightness in shoulders and traps  Has burning and stinging pain in shoulders with overhead activity   Left shoulder looks more bony cording to patient report  The patient has had x-rays of the shoulder on the left side showing before meals and glenohumeral joint osteoarthritis. He has no numbness or tingling in the shoulders or the arms.  No walking issues.  His pain does increase with inactivity.  Exercise seems to help loosen things up.  He also uses a TENS unit.   EXAM: MRI CERVICAL AND THORACIC SPINE WITHOUT CONTRAST   TECHNIQUE: Multiplanar and multiecho pulse sequences of the cervical spine, to include the craniocervical junction and cervicothoracic junction, and the thoracic spine, were obtained without intravenous contrast.   COMPARISON:  Cervical spine CT 05/14/2012. Lumbar spine radiographs 03/28/2019.   FINDINGS: MRI CERVICAL SPINE FINDINGS   Alignment: Physiologic.   Vertebrae: No acute or suspicious osseous findings. Multilevel cervical spondylosis superimposed on a congenitally small spinal canal.   Cord: Normal in signal and caliber.   Posterior Fossa, vertebral arteries, paraspinal tissues: Visualized portions of the posterior fossa appear unremarkable.Bilateral vertebral artery flow voids. No significant paraspinal findings.   Disc levels:   C2-3: The disc appears normal. There is bilateral facet hypertrophy which contributes to mild foraminal narrowing  bilaterally. No spinal stenosis.   C3-4: Mild disc bulging with chronic left greater than right facet hypertrophy. No spinal stenosis. Mild chronic foraminal narrowing bilaterally.   C4-5: Spondylosis with posterior osteophytes covering diffusely bulging disc material. There is left greater than right facet hypertrophy. These factors contribute to spinal stenosis with effacement of the CSF surrounding the cord. There is no cord deformity. Moderate foraminal narrowing is present bilaterally which appears progressive from remote CT.   C5-6: Mild spondylosis with a chronic small central disc protrusion, uncinate spurring and facet hypertrophy asymmetric to the left. No spinal stenosis. Mild right and moderate left foraminal narrowing.   C6-7: Chronic spondylosis with loss of disc height and posterior osteophytes covering diffusely bulging disc material. Mild bilateral facet hypertrophy. There is effacement of the CSF surrounding the cord without cord deformity. Severe foraminal narrowing is present bilaterally which could affect either C7 nerve root.   C7-T1: Mild bilateral facet hypertrophy. No spinal stenosis or nerve root encroachment.   Pain Inventory Average Pain 7 Pain Right Now 6 My pain is constant, sharp, burning, dull, stabbing, tingling, and aching  In the last 24 hours, has pain interfered with the following? General activity 5 Relation with others 3 Enjoyment of life 6 What TIME of day is your pain at its worst? morning  and night Sleep (in general) Fair  Pain is worse with: sitting and inactivity Pain improves with: heat/ice, therapy/exercise, and TENS Relief from Meds: 5  Family History  Problem Relation Age of Onset   Breast cancer Mother        Died at 1   Lung cancer Father    Prostate cancer Father  Died at 39    Colon cancer Paternal Grandmother    Esophageal cancer Neg Hx    Rectal cancer Neg Hx    Stomach cancer Neg Hx    Colon polyps  Neg Hx    Social History   Socioeconomic History   Marital status: Divorced    Spouse name: Not on file   Number of children: Not on file   Years of education: Not on file   Highest education level: Bachelor's degree (e.g., BA, AB, BS)  Occupational History   Not on file  Tobacco Use   Smoking status: Never   Smokeless tobacco: Never  Vaping Use   Vaping status: Never Used  Substance and Sexual Activity   Alcohol use: Yes    Alcohol/week: 10.0 standard drinks of alcohol    Types: 10 Glasses of wine per week   Drug use: Yes    Types: Other-see comments, "Crack" cocaine, Cocaine, Marijuana    Comment: current marijuana use  twice a month    Sexual activity: Not on file    Comment: oxycontin  Other Topics Concern   Not on file  Social History Narrative   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    Social Determinants of Health   Financial Resource Strain: Low Risk  (03/02/2023)   Overall Financial Resource Strain (CARDIA)    Difficulty of Paying Living Expenses: Not very hard  Food Insecurity: No Food Insecurity (03/02/2023)   Hunger Vital Sign    Worried About Running Out of Food in the Last Year: Never true    Ran Out of Food in the Last Year: Never true  Transportation Needs: No Transportation Needs (03/02/2023)   PRAPARE - Administrator, Civil Service (Medical): No    Lack of Transportation (Non-Medical): No  Physical Activity: Insufficiently Active (03/02/2023)   Exercise Vital Sign    Days of Exercise per Week: 3 days    Minutes of Exercise per Session: 20 min  Stress: Stress Concern Present (03/02/2023)   [employer] of Occupational Health - Occupational Stress Questionnaire    Feeling of Stress : To some extent  Social Connections: Socially Isolated (03/02/2023)   Social Connection and Isolation Panel [NHANES]    Frequency of Communication with Friends and Family: Three times a week    Frequency of  Social Gatherings with Friends and Family: Once a week    Attends Religious Services: Never    Database administrator or Organizations: No    Attends Banker Meetings: Not on file    Marital Status: Divorced   Past Surgical History:  Procedure Laterality Date   ESOPHAGOGASTRODUODENOSCOPY N/A 11/04/2021   Procedure: ESOPHAGOGASTRODUODENOSCOPY (EGD);  Surgeon: Jenel Lucks, MD;  Location: [facility];  Service: Gastroenterology;  Laterality: N/A;   SKIN CANCER EXCISION  2015   TONSILLECTOMY     WISDOM TOOTH EXTRACTION     nausea post op   Past Surgical History:  Procedure Laterality Date   ESOPHAGOGASTRODUODENOSCOPY N/A 11/04/2021   Procedure: ESOPHAGOGASTRODUODENOSCOPY (EGD);  Surgeon: Jenel Lucks, MD;  Location: [facility];  Service: Gastroenterology;  Laterality: N/A;   SKIN CANCER EXCISION  2015   TONSILLECTOMY     WISDOM TOOTH EXTRACTION     nausea post op   Past Medical History:  Diagnosis Date   Anxiety    Depression    Drug addiction (HCC)  High cholesterol    Hypertensive urgency    Prostate cancer (HCC)    Tinnitus    Ht 5\' 8"  (1.727 m)   Wt 182 lb (82.6 kg)   BMI 27.67 kg/m   Opioid Risk Score:   Fall Risk Score:  `1  Depression screen [facility] 2/9     03/08/2023    2:46 PM 03/02/2023   10:35 AM 10/21/2022    4:15 PM 09/21/2022    9:28 AM 07/23/2022    2:11 PM 05/25/2022    3:02 PM 04/13/2022   10:17 AM  Depression screen PHQ 2/9  Decreased Interest 1 1 0 1 0 1 1  Down, Depressed, Hopeless 1 1 0 1 0 1 1  PHQ - 2 Score 2 2 0 2 0 2 2  Altered sleeping  1 0    1  Tired, decreased energy  1 0    1  Change in appetite  0 0    0  Feeling bad or failure about yourself   1 0    1  Trouble concentrating  0 0    1  Moving slowly or fidgety/restless  0 0    0  Suicidal thoughts  0 0      PHQ-9 Score  5 0    6  Difficult doing work/chores  Somewhat difficult Not difficult at all    Somewhat difficult    Review of Systems   Musculoskeletal:  Positive for back pain and neck pain.       Pain in both shoulders  All other systems reviewed and are negative.      Objective:   Physical Exam Vitals reviewed.  Constitutional:      Appearance: He is obese.  HENT:     Head: Normocephalic and atraumatic.  Eyes:     Extraocular Movements: Extraocular movements intact.     Conjunctiva/sclera: Conjunctivae normal.     Pupils: Pupils are equal, round, and reactive to light.  Musculoskeletal:     Comments: Pain with adduction of the left shoulder pain over the acromioclavicular joint.  Also pain with abduction at 90 degrees as well as forward flexion at 90 degrees.  No crepitus heard. There is mild deltoid atrophy on the left side but no numbness over the shoulder area. No scapular winging noted. Cervical spine range of motion is 50% flexion extension lateral bending and rotation.  Bilateral hands with Heberden's nodules on the DIPs as well as osteoarthritis changes at the PIPs.  Reduced finger flexion noted right greater than left hand   Skin:    General: Skin is warm and dry.  Neurological:     Mental Status: He is alert and oriented to person, place, and time.  Psychiatric:        Mood and Affect: Mood normal.        Behavior: Behavior normal.           Assessment & Plan:   1.  Chronic pain syndrome multifactorial he does have osteoarthritis in multiple locations including cervical spine bilateral hands and left greater than right shoulder.  His shoulder pain appears to be worse at this time.  I do think it is a combination of his acromioclavicular joint arthritis as well as glenohumeral but also may have some impingement syndrome as well. The patient has been advised to try Voltaren gel to both hands this can also be used over the acromioclavicular joints.  If no improvement after 1 month may consider [facility]  joint injection under ultrasound guidance. The patient states for financial reasons he may be seeking  a clinic that does not have facility fee.  We discussed and wrote down the name of Dr. Althea Charon over at [facility] orthopedics.  Do not see any signs of cervical myelopathy. Patient will call for appointment if he wishes to pursue left shoulder injection under ultrasound guidance, AC joint

## 2023-03-08 NOTE — Patient Instructions (Addendum)
Diclofenac Gel  What is this medication? DICLOFENAC (dye KLOE fen ak) treats joint pain caused by arthritis. It works by decreasing inflammation. It belongs to a group of medications called NSAIDs. This medicine may be used for other purposes; ask your health care provider or pharmacist if you have questions. COMMON BRAND NAME(S): Aspercreme Arthritis Pain Reliever, Diclofono, DICLOPREP, DSG Pak, Motrin Arthritis Pain, Omeca, Solaravix, Solaraze, ValcoPrep-100, VennGel One, Voltaren Arthritis, Voltaren Gel What should I tell my care team before I take this medication? They need to know if you have any of these conditions: Bleeding disorder Coronary artery bypass graft (CABG) within the past 2 weeks Frequently drink alcohol Heart attack Heart disease Heart failure High blood pressure Kidney disease Large areas of burned or damaged skin Liver disease Low red blood cell levels Lung or breathing disease, such as asthma Receiving steroids like dexamethasone or prednisone Skin conditions or sensitivity Stomach bleeding Stomach or intestine problems Take medications that treat or prevent blood clots Tobacco use An unusual or allergic reaction to diclofenac, other medications, foods, dyes, or preservatives Pregnant or trying to get pregnant Breastfeeding How should I use this medication? This medication is for external use only. Do not take by mouth. Wash your hands before and after use. If you are treating your hands, only wash your hands before use. Do not get it in your eyes. If you do, rinse your eyes with plenty of cool tap water. Use it as directed on the prescription label at the same time every day. Do not use it more often than directed. Keep taking it unless your care team tells you to stop. Apply a thin film of the medication to the affected area. If you are using Voltaren or Venngel One, they come with INSTRUCTIONS FOR USE. Ask your pharmacist for directions on how to use this  medication. Read the information carefully. Talk to your pharmacist or care team if you have questions. A special MedGuide will be given to you by the pharmacist with each prescription and refill. Be sure to read this information carefully each time. Talk to your care team about the use of this medication in children. Special care may be needed. Overdosage: If you think you have taken too much of this medicine contact a poison control center or emergency room at once. NOTE: This medicine is only for you. Do not share this medicine with others. What if I miss a dose? If you are using Voltaren or Venngel One: If you miss a dose, skip it. Use your next dose at the normal time. Do not use extra or 2 doses at the same time to make up for the missed dose. If you are using Solaraze: If you miss a dose, use it as soon as you can. If it is almost time for your next dose, use only that dose. Do not use double or extra doses. What may interact with this medication? Aspirin NSAIDs, medications for pain and inflammation, like ibuprofen or naproxen This list may not describe all possible interactions. Give your health care provider a list of all the medicines, herbs, non-prescription drugs, or dietary supplements you use. Also tell them if you smoke, drink alcohol, or use illegal drugs. Some items may interact with your medicine. What should I watch for while using this medication? Visit your care team for regular checks on your progress. Tell your care team if your symptoms do not start to get better or if they get worse. Do not take other medications  that contain aspirin, ibuprofen, or naproxen with this medication. Side effects such as stomach upset, nausea, or ulcers may be more likely to occur. Many non-prescription medications contain aspirin, ibuprofen, or naproxen. Always read labels carefully. If you have questions, ask your care team. This medication can cause serious ulcers and bleeding in the stomach.  It can happen with no warning. Tobacco, alcohol, older age, and poor health can also increase risks. Call your care team right away if you have stomach pain or blood in your vomit or stool. This medication does not prevent a heart attack or stroke. This medication may increase the chance of a heart attack or stroke. The chance may increase the longer you use this medication or if you have heart disease. If you take aspirin to prevent a heart attack or stroke, talk to your care team about using this medication. This medication may cause serious skin reactions. They can happen weeks to months after starting the medication. Contact your care team right away if you notice fevers or flu-like symptoms with a rash. The rash may be red or purple and then turn into blisters or peeling of the skin. You may also notice a red rash with swelling of the face, lips, or lymph nodes in your neck or under your arms. Talk to your care team if you wish to become pregnant or think you might be pregnant. This medication can cause serious birth defects if taken during pregnancy. This medication may affect your coordination, reaction time, or judgment. Do not drive or operate machinery until you know how this medication affects you. Sit up or stand slowly to reduce the risk of dizzy or fainting spells. Drinking alcohol with this medication can increase the risk of these side effects. Be careful brushing or flossing your teeth or using a toothpick because you may get an infection or bleed more easily. If you have any dental work done, tell your dentist you are receiving this medication. This medication may make it more difficult to get pregnant. Talk to your care team if you are concerned about your fertility. What side effects may I notice from receiving this medication? Side effects that you should report to your care team as soon as possible: Allergic reactions--skin rash, itching, hives, swelling of the face, lips, tongue, or  throat Bleeding--bloody or black, tar-like stools, vomiting blood or brown material that looks like coffee grounds, red or dark brown urine, small red or purple spots on skin, unusual bruising or bleeding Heart attack--pain or tightness in the chest, shoulders, arms, or jaw, nausea, shortness of breath, cold or clammy skin, feeling faint or lightheaded Heart failure--shortness of breath, swelling of ankles, feet, or hands, sudden weight gain, unusual weakness or fatigue Increase in blood pressure Kidney injury--decrease in the amount of urine, swelling of the ankles, hands, or feet Liver injury--right upper belly pain, loss of appetite, nausea, light-colored stool, dark yellow or brown urine, yellowing skin or eyes, unusual weakness or fatigue Rash, fever, and swollen lymph nodes Redness, blistering, peeling, or loosening of the skin, including inside the mouth Stroke--sudden numbness or weakness of the face, arm, or leg, trouble speaking, confusion, trouble walking, loss of balance or coordination, dizziness, severe headache, change in vision Side effects that usually do not require medical attention (report to your care team if they continue or are bothersome): Headache Irritation at application site Loss of appetite Nausea Upset stomach This list may not describe all possible side effects. Call your doctor for medical advice  about side effects. You may report side effects to FDA at 1-800-FDA-1088. Where should I keep my medication? Keep out of the reach of children and pets. Store at room temperature between 15 and 30 degrees C (59 and 86 degrees F). Do not freeze. Protect from heat. Get rid of any unused medication after the expiration date. To get rid of medications that are no longer needed or have expired: Take the medication to a medication take-back program. Check with your pharmacy or law enforcement to find a location. If you cannot return the medication, check the label or package  insert to see if the medication should be thrown out in the garbage or flushed down the toilet. If you are not sure, ask your care team. If it is safe to put it in the trash, empty the medication out of the container. Mix the medication with cat litter, dirt, coffee grounds, or other unwanted substance. Seal the mixture in a bag or container. Put it in the trash. NOTE: This sheet is a summary. It may not cover all possible information. If you have questions about this medicine, talk to your doctor, pharmacist, or health care provider.  2024 Elsevier/Gold Standard (2022-03-11 00:00:00)  Dr Kateri Plummer at [facility] orthopedics

## 2023-04-07 ENCOUNTER — Encounter: Payer: Medicare Other | Admitting: Physical Medicine & Rehabilitation

## 2023-04-18 ENCOUNTER — Ambulatory Visit: Payer: Medicare Other | Attending: Adult Health | Admitting: Audiologist

## 2023-04-18 DIAGNOSIS — H903 Sensorineural hearing loss, bilateral: Secondary | ICD-10-CM | POA: Insufficient documentation

## 2023-04-18 NOTE — Procedures (Signed)
Outpatient Audiology and [facility] [address] 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 Frank [name] has had hearing loss for a while but feels its been getting worse over the last eight months. 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 Frank [name] has tinnitus in both ears. He has adapted to it. No pain or pressure in either ear. He is asking people to repeat, especially if multiple are talking at once.   Evaluation:  Otoscopy showed a clear view of the tympanic membranes, bilaterally Tympanometry results were consistent with normal middle ear function, bilaterally   Audiometric testing was completed using Conventional Audiometry techniques with insert earphones and TDH headphones. Test results are consistent with normal sloping to moderately severe sensorineural hearing loss bilaterally. Speech Recognition Thresholds were obtained at 40dB HL in the right ear and at 35dB HL in the left ear. Word Recognition Testing was completed at  40dB SL and Frank [name] scored 92% in the right ear and 88% in the left ear.   Results:  The test results were reviewed with Frank [name]. Frank [name] needs hearing aids due to moderately severe high frequency hearing loss bilaterally. This loss is likely due to age and noise exposure. He is motivated to try hearing aids and purchase musician's earplugs.    Recommendations: 1.   Hearing aids recommended for both ears, call [place] [facility] (see handout).   32 minutes spent testing and counseling on results.   If you have any questions please feel  free to contact me at [phone].  Ammie Ferrier Audiologist, Au.D., CCC-A 04/18/2023  2:37 PM  Cc: Shirline Frees, NP

## 2023-04-19 ENCOUNTER — Other Ambulatory Visit: Payer: Self-pay | Admitting: Physical Medicine & Rehabilitation

## 2023-05-12 ENCOUNTER — Other Ambulatory Visit: Payer: Self-pay | Admitting: Adult Health

## 2023-05-12 DIAGNOSIS — G629 Polyneuropathy, unspecified: Secondary | ICD-10-CM

## 2023-05-12 DIAGNOSIS — M7918 Myalgia, other site: Secondary | ICD-10-CM

## 2023-05-19 ENCOUNTER — Other Ambulatory Visit: Payer: Self-pay | Admitting: Adult Health

## 2023-05-20 ENCOUNTER — Telehealth: Payer: Self-pay | Admitting: Adult Health

## 2023-05-20 DIAGNOSIS — H1132 Conjunctival hemorrhage, left eye: Secondary | ICD-10-CM | POA: Diagnosis not present

## 2023-05-20 NOTE — Telephone Encounter (Signed)
Pt called earlier to make an appt to see cory due to eye  issue and swollen. Pt was transferred to triage nurse and was told to go to ED. Pt called back and per pt was told UC and pt wanted to see his provider. I told patient triage nurse said to go to ED. He said oh really

## 2023-05-20 NOTE — Telephone Encounter (Signed)
Patient notified of update  and verbalized understanding. Pt stated that he was seen in UC. PT stated that he will be ok.

## 2023-05-20 NOTE — Telephone Encounter (Signed)
Oops meant to send to team

## 2023-07-29 ENCOUNTER — Other Ambulatory Visit: Payer: Self-pay | Admitting: Adult Health

## 2023-07-29 DIAGNOSIS — I10 Essential (primary) hypertension: Secondary | ICD-10-CM

## 2023-08-03 ENCOUNTER — Other Ambulatory Visit: Payer: Self-pay | Admitting: Physical Medicine & Rehabilitation

## 2023-08-05 ENCOUNTER — Other Ambulatory Visit: Payer: Self-pay | Admitting: Adult Health

## 2023-08-05 DIAGNOSIS — G629 Polyneuropathy, unspecified: Secondary | ICD-10-CM

## 2023-08-05 DIAGNOSIS — M7918 Myalgia, other site: Secondary | ICD-10-CM

## 2023-08-17 ENCOUNTER — Other Ambulatory Visit: Payer: Self-pay | Admitting: Adult Health

## 2023-08-17 DIAGNOSIS — F5101 Primary insomnia: Secondary | ICD-10-CM

## 2023-08-17 DIAGNOSIS — F324 Major depressive disorder, single episode, in partial remission: Secondary | ICD-10-CM

## 2023-08-17 NOTE — Telephone Encounter (Signed)
 Copied from CRM [id number]. Topic: Clinical - Medication Refill >> Aug 17, 2023  4:13 PM Alcus Dad wrote: Most Recent Primary Care Visit:  Provider: Shirline Frees  Department: [facility]  Visit Type: PHYSICAL  Date: 03/02/2023  Medication: QUEtiapine (SEROQUEL) 200 MG tablet  Has the patient contacted their pharmacy? Yes (Agent: If no, request that the patient contact the pharmacy for the refill. If patient does not wish to contact the pharmacy document the reason why and proceed with request.) (Agent: If yes, when and what did the pharmacy advise?)  Is this the correct pharmacy for this prescription? Yes If no, delete pharmacy and type the correct one.  This is the patient's preferred pharmacy:  [id number] [place], [place] - [address] AT [facility] [address] [place] [address] Phone: [phone] Fax: [phone]   Has the prescription been filled recently? No  Is the patient out of the medication? Yes  Has the patient been seen for an appointment in the last year OR does the patient have an upcoming appointment? Yes  Can we respond through MyChart? Yes  Agent: Please be advised that Rx refills may take up to 3 business days. We ask that you follow-up with your pharmacy.

## 2023-09-23 ENCOUNTER — Encounter: Payer: Medicare Other | Attending: Physical Medicine & Rehabilitation | Admitting: Physical Medicine & Rehabilitation

## 2023-09-23 NOTE — Progress Notes (Deleted)
 Subjective:    Patient ID: Frank [name], male    DOB: February 04, 1951, 73 y.o.   MRN: [id number]  HPI   Pain Inventory Average Pain {NUMBERS; 0-10:5044} Pain Right Now {NUMBERS; 0-10:5044} My pain is {PAIN DESCRIPTION:21022940}  In the last 24 hours, has pain interfered with the following? General activity {NUMBERS; 0-10:5044} Relation with others {NUMBERS; 0-10:5044} Enjoyment of life {NUMBERS; 0-10:5044} What TIME of day is your pain at its worst? {time of day:24191} Sleep (in general) {BHH GOOD/FAIR/POOR:22877}  Pain is worse with: {ACTIVITIES:21022942} Pain improves with: {PAIN IMPROVES NWGN:56213086} Relief from Meds: {NUMBERS; 0-10:5044}  Family History  Problem Relation Age of Onset   Breast cancer Mother        Died at 61   Lung cancer Father    Prostate cancer Father        Died at 25    Colon cancer Paternal Grandmother    Esophageal cancer Neg Hx    Rectal cancer Neg Hx    Stomach cancer Neg Hx    Colon polyps Neg Hx    Social History   Socioeconomic History   Marital status: Divorced    Spouse name: Not on file   Number of children: Not on file   Years of education: Not on file   Highest education level: Bachelor's degree (e.g., BA, AB, BS)  Occupational History   Not on file  Tobacco Use   Smoking status: Never   Smokeless tobacco: Never  Vaping Use   Vaping status: Never Used  Substance and Sexual Activity   Alcohol use: Yes    Alcohol/week: 10.0 standard drinks of alcohol    Types: 10 Glasses of wine per week   Drug use: Yes    Types: Other-see comments, "Crack" cocaine, Cocaine, Marijuana    Comment: current marijuana use  twice a month    Sexual activity: Not on file    Comment: oxycontin  Other Topics Concern   Not on file  Social History Narrative   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    Social Drivers of Corporate investment banker Strain: Low Risk  (03/02/2023)   Overall  Financial Resource Strain (CARDIA)    Difficulty of Paying Living Expenses: Not very hard  Food Insecurity: No Food Insecurity (03/02/2023)   Hunger Vital Sign    Worried About Running Out of Food in the Last Year: Never true    Ran Out of Food in the Last Year: Never true  Transportation Needs: No Transportation Needs (03/02/2023)   PRAPARE - Administrator, Civil Service (Medical): No    Lack of Transportation (Non-Medical): No  Physical Activity: Insufficiently Active (03/02/2023)   Exercise Vital Sign    Days of Exercise per Week: 3 days    Minutes of Exercise per Session: 20 min  Stress: Stress Concern Present (03/02/2023)   [employer] of Occupational Health - Occupational Stress Questionnaire    Feeling of Stress : To some extent  Social Connections: Socially Isolated (03/02/2023)   Social Connection and Isolation Panel [NHANES]    Frequency of Communication with Friends and Family: Three times a week    Frequency of Social Gatherings with Friends and Family: Once a week    Attends Religious Services: Never    Database administrator or Organizations: No    Attends Banker Meetings: Not on file  Marital Status: Divorced   Past Surgical History:  Procedure Laterality Date   ESOPHAGOGASTRODUODENOSCOPY N/A 11/04/2021   Procedure: ESOPHAGOGASTRODUODENOSCOPY (EGD);  Surgeon: Jenel Lucks, MD;  Location: [facility];  Service: Gastroenterology;  Laterality: N/A;   SKIN CANCER EXCISION  2015   TONSILLECTOMY     WISDOM TOOTH EXTRACTION     nausea post op   Past Surgical History:  Procedure Laterality Date   ESOPHAGOGASTRODUODENOSCOPY N/A 11/04/2021   Procedure: ESOPHAGOGASTRODUODENOSCOPY (EGD);  Surgeon: Jenel Lucks, MD;  Location: [facility];  Service: Gastroenterology;  Laterality: N/A;   SKIN CANCER EXCISION  2015   TONSILLECTOMY     WISDOM TOOTH EXTRACTION     nausea post op   Past Medical History:  Diagnosis Date   Anxiety     Depression    Drug addiction (HCC)    High cholesterol    Hypertensive urgency    Prostate cancer (HCC)    Tinnitus    There were no vitals taken for this visit.  Opioid Risk Score:   Fall Risk Score:  `1  Depression screen [facility] 2/9     03/08/2023    2:46 PM 03/02/2023   10:35 AM 10/21/2022    4:15 PM 09/21/2022    9:28 AM 07/23/2022    2:11 PM 05/25/2022    3:02 PM 04/13/2022   10:17 AM  Depression screen PHQ 2/9  Decreased Interest 1 1 0 1 0 1 1  Down, Depressed, Hopeless 1 1 0 1 0 1 1  PHQ - 2 Score 2 2 0 2 0 2 2  Altered sleeping  1 0    1  Tired, decreased energy  1 0    1  Change in appetite  0 0    0  Feeling bad or failure about yourself   1 0    1  Trouble concentrating  0 0    1  Moving slowly or fidgety/restless  0 0    0  Suicidal thoughts  0 0      PHQ-9 Score  5 0    6  Difficult doing work/chores  Somewhat difficult Not difficult at all    Somewhat difficult    Review of Systems     Objective:   Physical Exam        Assessment & Plan:

## 2023-10-21 ENCOUNTER — Other Ambulatory Visit: Payer: Self-pay | Admitting: Adult Health

## 2023-10-21 DIAGNOSIS — I10 Essential (primary) hypertension: Secondary | ICD-10-CM

## 2023-10-27 ENCOUNTER — Ambulatory Visit (INDEPENDENT_AMBULATORY_CARE_PROVIDER_SITE_OTHER): Payer: Medicare Other | Admitting: Family Medicine

## 2023-10-27 NOTE — Progress Notes (Signed)
 error

## 2023-11-06 ENCOUNTER — Other Ambulatory Visit: Payer: Self-pay | Admitting: Adult Health

## 2023-11-06 DIAGNOSIS — M7918 Myalgia, other site: Secondary | ICD-10-CM

## 2023-11-06 DIAGNOSIS — G629 Polyneuropathy, unspecified: Secondary | ICD-10-CM

## 2023-12-13 ENCOUNTER — Other Ambulatory Visit: Payer: Self-pay | Admitting: Adult Health

## 2023-12-13 DIAGNOSIS — F5101 Primary insomnia: Secondary | ICD-10-CM

## 2023-12-13 DIAGNOSIS — F324 Major depressive disorder, single episode, in partial remission: Secondary | ICD-10-CM

## 2023-12-18 ENCOUNTER — Other Ambulatory Visit: Payer: Self-pay | Admitting: Adult Health

## 2023-12-18 DIAGNOSIS — F324 Major depressive disorder, single episode, in partial remission: Secondary | ICD-10-CM

## 2023-12-18 DIAGNOSIS — F5101 Primary insomnia: Secondary | ICD-10-CM

## 2023-12-29 ENCOUNTER — Telehealth: Payer: Self-pay

## 2023-12-29 NOTE — Telephone Encounter (Signed)
 Patient wanted to know if he needs to be seen in the office again?  To get a new referral for his pain. He has been informed referrals are usually good for 6 month.   Please advise.  (April ) Frank [name] has a question about his bill.   Call back ph [phone]

## 2024-01-23 ENCOUNTER — Other Ambulatory Visit: Payer: Self-pay | Admitting: Adult Health

## 2024-01-23 DIAGNOSIS — I10 Essential (primary) hypertension: Secondary | ICD-10-CM

## 2024-01-24 ENCOUNTER — Other Ambulatory Visit: Payer: Self-pay | Admitting: Adult Health

## 2024-03-02 ENCOUNTER — Ambulatory Visit (INDEPENDENT_AMBULATORY_CARE_PROVIDER_SITE_OTHER)

## 2024-03-02 VITALS — Ht 68.0 in | Wt 190.0 lb

## 2024-03-02 DIAGNOSIS — Z0000 Encounter for general adult medical examination without abnormal findings: Secondary | ICD-10-CM

## 2024-03-02 NOTE — Progress Notes (Signed)
 Subjective:   Frank [name] is a 72 y.o. who presents for a Medicare Wellness preventive visit.  As a reminder, Annual Wellness Visits don't include a physical exam, and some assessments may be limited, especially if this visit is performed virtually. We may recommend an in-person follow-up visit with your provider if needed.  Visit Complete: Virtual I connected with  Frank [name] on 03/02/24 by a audio enabled telemedicine application and verified that I am speaking with the correct person using two identifiers.  Patient Location: Home  Provider Location: Home Office  I discussed the limitations of evaluation and management by telemedicine. The patient expressed understanding and agreed to proceed.  Vital Signs: Because this visit was a virtual/telehealth visit, some criteria may be missing or patient reported. Any vitals not documented were not able to be obtained and vitals that have been documented are patient reported.    Persons Participating in Visit: Patient.  AWV Questionnaire: No: Patient Medicare AWV questionnaire was not completed prior to this visit.  Cardiac Risk Factors include: advanced age (>66men, >79 women);male gender;hypertension     Objective:    Today's Vitals   03/02/24 1543  Weight: 190 lb (86.2 kg)  Height: 5' 8 (1.727 m)   Body mass index is 28.89 kg/m.     03/02/2024    3:56 PM 11/04/2021    1:24 AM 11/03/2021   11:20 PM 10/16/2021   11:45 AM 05/20/2021    8:58 AM 02/15/2021    6:43 PM 12/23/2020    9:50 AM  Advanced Directives  Does Patient Have a Medical Advance Directive? No No No No No No No  Would patient like information on creating a medical advance directive? No - Patient declined No - Patient declined No - Patient declined No - Patient declined No - Patient declined No - Patient declined No - Patient declined    Current Medications (verified) Outpatient Encounter Medications as of 03/02/2024  Medication Sig   amLODipine  (NORVASC ) 10 MG  tablet TAKE 1 TABLET(10 MG) BY MOUTH DAILY   atorvastatin  (LIPITOR) 40 MG tablet TAKE 1 TABLET BY MOUTH DAILY   cyclobenzaprine  (FLEXERIL ) 5 MG tablet Take 1 tablet (5 mg total) by mouth at bedtime. (Patient not taking: Reported on 03/02/2024)   DULoxetine  (CYMBALTA ) 20 MG capsule TAKE 1 CAPSULE(20 MG) BY MOUTH TWICE DAILY (Patient not taking: Reported on 03/02/2024)   gabapentin  (NEURONTIN ) 300 MG capsule TAKE 1 CAPSULE(300 MG) BY MOUTH TWICE DAILY   ibuprofen  (ADVIL ) 600 MG tablet Take 600 mg by mouth every 4 (four) hours as needed.   lisinopril  (ZESTRIL ) 40 MG tablet TAKE 1 TABLET BY MOUTH DAILY   meloxicam  (MOBIC ) 15 MG tablet TAKE 1 TABLET(15 MG) BY MOUTH DAILY (Patient not taking: Reported on 03/08/2023)   Multiple Vitamin (MULTIVITAMIN ADULT PO) Take 1,500 mcg by mouth daily.   QUEtiapine  (SEROQUEL ) 200 MG tablet TAKE 1 TABLET(200 MG) BY MOUTH AT BEDTIME   sildenafil (VIAGRA) 50 MG tablet Take 50 mg by mouth as needed for erectile dysfunction.   tamsulosin  (FLOMAX ) 0.4 MG CAPS capsule Take 2 capsules (0.8 mg total) by mouth daily after supper.   Facility-Administered Encounter Medications as of 03/02/2024  Medication   lidocaine  (XYLOCAINE ) 1 % (with pres) injection 4 mL    Allergies (verified) Patient has no known allergies.   History: Past Medical History:  Diagnosis Date   Anxiety    Depression    Drug addiction (HCC)    High cholesterol  Hypertensive urgency    Prostate cancer [facility])    Tinnitus    Past Surgical History:  Procedure Laterality Date   ESOPHAGOGASTRODUODENOSCOPY N/A 11/04/2021   Procedure: ESOPHAGOGASTRODUODENOSCOPY (EGD);  Surgeon: Stacia Glendia BRAVO, MD;  Location: [facility];  Service: Gastroenterology;  Laterality: N/A;   SKIN CANCER EXCISION  2015   TONSILLECTOMY     WISDOM TOOTH EXTRACTION     nausea post op   Family History  Problem Relation Age of Onset   Breast cancer Mother        Died at 79   Lung cancer Father    Prostate cancer Father         Died at 20    Colon cancer Paternal Grandmother    Esophageal cancer Neg Hx    Rectal cancer Neg Hx    Stomach cancer Neg Hx    Colon polyps Neg Hx    Social History   Socioeconomic History   Marital status: Divorced    Spouse name: Not on file   Number of children: Not on file   Years of education: Not on file   Highest education level: Bachelor's degree (e.g., BA, AB, BS)  Occupational History   Not on file  Tobacco Use   Smoking status: Never   Smokeless tobacco: Never  Vaping Use   Vaping status: Never Used  Substance and Sexual Activity   Alcohol use: Yes    Alcohol/week: 10.0 standard drinks of alcohol    Types: 10 Glasses of wine per week   Drug use: Yes    Types: Other-see comments, Crack cocaine, Cocaine, Marijuana    Comment: current marijuana use  twice a month    Sexual activity: Not on file    Comment: oxycontin  Other Topics Concern   Not on file  Social History Narrative   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    Social Drivers of Corporate investment banker Strain: Low Risk  (03/02/2024)   Overall Financial Resource Strain (CARDIA)    Difficulty of Paying Living Expenses: Not hard at all  Food Insecurity: No Food Insecurity (03/02/2024)   Hunger Vital Sign    Worried About Running Out of Food in the Last Year: Never true    Ran Out of Food in the Last Year: Never true  Transportation Needs: No Transportation Needs (03/02/2024)   PRAPARE - Administrator, Civil Service (Medical): No    Lack of Transportation (Non-Medical): No  Physical Activity: Insufficiently Active (03/02/2024)   Exercise Vital Sign    Days of Exercise per Week: 2 days    Minutes of Exercise per Session: 60 min  Stress: No Stress Concern Present (03/02/2024)   [employer] of Occupational Health - Occupational Stress Questionnaire    Feeling of Stress: Not at all  Social Connections: Socially Isolated  (03/02/2024)   Social Connection and Isolation Panel    Frequency of Communication with Friends and Family: More than three times a week    Frequency of Social Gatherings with Friends and Family: More than three times a week    Attends Religious Services: Never    Database administrator or Organizations: No    Attends Banker Meetings: Never    Marital Status: Divorced    Tobacco Counseling Counseling given: Not Answered    Clinical Intake:  Pre-visit preparation completed: Yes  Pain :  No/denies pain     BMI - recorded: 28.89 Nutritional Status: BMI 25 -29 Overweight Nutritional Risks: None Diabetes: No  Lab Results  Component Value Date   HGBA1C 5.6 03/02/2023   HGBA1C 5.8 11/27/2021   HGBA1C 5.6 01/31/2020     How often do you need to have someone help you when you read instructions, pamphlets, or other written materials from your doctor or pharmacy?: 1 - Never  Interpreter Needed?: No  Information entered by :: Frank Blush LPN   Activities of Daily Living      03/02/2024    3:58 PM  In your present state of health, do you have any difficulty performing the following activities:  Hearing? 0  Vision? 0  Difficulty concentrating or making decisions? 0  Walking or climbing stairs? 0  Dressing or bathing? 0  Doing errands, shopping? 0  Preparing Food and eating ? N  Using the Toilet? N  In the past six months, have you accidently leaked urine? N  Do you have problems with loss of bowel control? N  Managing your Medications? N  Managing your Finances? N  Housekeeping or managing your Housekeeping? N    Patient Care Team: Merna Huxley, NP as PCP - General (Family Medicine) Pa, [facility] (Urology) Liane Sharyne MATSU, [facility] (Inactive) as Pharmacist (Pharmacist) Cam Morene ORN, MD as Attending Physician (Urology) Patrcia Cough, MD as Consulting Physician (Radiation Oncology) Crawford Morna Pickle, NP as Nurse  Practitioner (Hematology and Oncology) Dove, Natro D, RN as Registered Nurse   I have updated your Care Teams any recent Medical Services you may have received from other providers in the past year.     Assessment:   This is a routine wellness examination for [employer].  Hearing/Vision screen Hearing Screening - Comments:: Denies hearing difficulties   Vision Screening - Comments:: Wears reading glasses -Not up to date with routine eye exams with  [facility]   Goals Addressed               This Visit's Progress     Increase physical activity (pt-stated)        Remain active.       Depression Screen      03/02/2024    3:52 PM 03/08/2023    2:46 PM 03/02/2023   10:35 AM 10/21/2022    4:15 PM 09/21/2022    9:28 AM 07/23/2022    2:11 PM 05/25/2022    3:02 PM  PHQ 2/9 Scores  PHQ - 2 Score 0 2 2 0 2 0 2  PHQ- 9 Score   5 0       Fall Risk      03/02/2024    3:56 PM 03/02/2024    3:55 PM 03/08/2023    2:46 PM 10/21/2022    4:15 PM 09/21/2022    9:29 AM  Fall Risk   Falls in the past year? 0 0 0 0 0  Number falls in past yr: 0 0 0 0 0  Injury with Fall? 0 0 0 0 0  Risk for fall due to : No Fall Risks No Fall Risks  No Fall Risks   Follow up Falls evaluation completed Falls evaluation completed  Falls evaluation completed     MEDICARE RISK AT HOME:   Medicare Risk at Home Any stairs in or around the home?: Yes If so, are there any without handrails?: Yes Home free of loose throw rugs in walkways, pet beds, electrical cords, etc?: Yes  Adequate lighting in your home to reduce risk of falls?: Yes Life alert?: No Use of a cane, walker or w/c?: No Grab bars in the bathroom?: No Shower chair or bench in shower?: No Elevated toilet seat or a handicapped toilet?: No  TIMED UP AND GO:  Was the test performed?  No  Cognitive Function: 6CIT completed        03/02/2024    3:56 PM 10/16/2021   11:45 AM  6CIT Screen  What Year? 0 points 0 points  What month? 0 points 0 points   What time? 0 points 0 points  Count back from 20 0 points 0 points  Months in reverse 0 points 0 points  Repeat phrase 0 points 0 points  Total Score 0 points 0 points    Immunizations Immunization History  Administered Date(s) Administered   PFIZER(Purple Top)SARS-COV-2 Vaccination 09/03/2019, 10/03/2019   Pneumococcal Conjugate-13 01/31/2020   Tdap 05/14/2012    Screening Tests Health Maintenance  Topic Date Due   Zoster Vaccines- Shingrix (1 of 2) Never done   COVID-19 Vaccine (3 - Pfizer risk series) 10/31/2019   Pneumococcal Vaccine: 50+ Years (2 of 2 - PPSV23, PCV20, or PCV21) 03/27/2020   DTaP/Tdap/Td (2 - Td or Tdap) 05/14/2022   Influenza Vaccine  Never done   Medicare Annual Wellness (AWV)  03/02/2025   Colonoscopy  07/12/2027   Hepatitis C Screening  Completed   HPV VACCINES  Aged Out   Meningococcal B Vaccine  Aged Out    Health Maintenance Items Addressed:   Additional Screening:  Vision Screening: Recommended annual ophthalmology exams for early detection of glaucoma and other disorders of the eye. Is the patient up to date with their annual eye exam?  No  Who is the provider or what is the name of the office in which the patient attends annual eye exams? [facility]  Dental Screening: Recommended annual dental exams for proper oral hygiene  Community Resource Referral / Chronic Care Management: CRR required this visit?  No   CCM required this visit?  No   Plan:    I have personally reviewed and noted the following in the patient's chart:   Medical and social history Use of alcohol, tobacco or illicit drugs  Current medications and supplements including opioid prescriptions. Patient is not currently taking opioid prescriptions. Functional ability and status Nutritional status Physical activity Advanced directives List of other physicians Hospitalizations, surgeries, and ER visits in previous 12 months Vitals Screenings to include cognitive,  depression, and falls Referrals and appointments  In addition, I have reviewed and discussed with patient certain preventive protocols, quality metrics, and best practice recommendations. A written personalized care plan for preventive services as well as general preventive health recommendations were provided to patient.   Frank LELON Blush, LPN   0/[id number]/[id number]   After Visit Summary: (MyChart) Due to this being a telephonic visit, the after visit summary with patients personalized plan was offered to patient via MyChart   Notes: Nothing significant to report at this time.

## 2024-03-02 NOTE — Patient Instructions (Addendum)
 Mr. Frank [name],  Thank you for taking the time for your Medicare Wellness Visit. I appreciate your continued commitment to your health goals. Please review the care plan we discussed, and feel free to reach out if I can assist you further.  Medicare recommends these wellness visits once per year to help you and your care team stay ahead of potential health issues. These visits are designed to focus on prevention, allowing your provider to concentrate on managing your acute and chronic conditions during your regular appointments.  Please note that Annual Wellness Visits do not include a physical exam. Some assessments may be limited, especially if the visit was conducted virtually. If needed, we may recommend a separate in-person follow-up with your provider.  Ongoing Care Seeing your primary care provider every 3 to 6 months helps us  monitor your health and provide consistent, personalized care.   Referrals If a referral was made during today's visit and you haven't received any updates within two weeks, please contact the referred provider directly to check on the status.  Recommended Screenings:  Health Maintenance  Topic Date Due   Zoster (Shingles) Vaccine (1 of 2) Never done   COVID-19 Vaccine (3 - Pfizer risk series) 10/31/2019   Pneumococcal Vaccine for age over 47 (2 of 2 - PPSV23, PCV20, or PCV21) 03/27/2020   DTaP/Tdap/Td vaccine (2 - Td or Tdap) 05/14/2022   Flu Shot  Never done   Medicare Annual Wellness Visit  03/02/2025   Colon Cancer Screening  07/12/2027   Hepatitis C Screening  Completed   HPV Vaccine  Aged Out   Meningitis B Vaccine  Aged Out       03/02/2024    3:56 PM  Advanced Directives  Does Patient Have a Medical Advance Directive? No  Would patient like information on creating a medical advance directive? No - Patient declined   Advance Care Planning is important because it: Ensures you receive medical care that aligns with your values, goals, and  preferences. Provides guidance to your family and loved ones, reducing the emotional burden of decision-making during critical moments.  Vision: Annual vision screenings are recommended for early detection of glaucoma, cataracts, and diabetic retinopathy. These exams can also reveal signs of chronic conditions such as diabetes and high blood pressure.  Dental: Annual dental screenings help detect early signs of oral cancer, gum disease, and other conditions linked to overall health, including heart disease and diabetes.  Please see the attached documents for additional preventive care recommendations.

## 2024-03-14 ENCOUNTER — Ambulatory Visit: Payer: Self-pay

## 2024-03-14 NOTE — Telephone Encounter (Signed)
 FYI Only or Action Required?: FYI only for provider.  Patient was last seen in primary care on 03/02/2023 by Merna Huxley, NP.  Called Nurse Triage reporting [place].  Symptoms began Ongoing x 1 year.  Interventions attempted: Nothing.  Symptoms are: gradually worsening.  Triage Disposition: See PCP When Office is Open (Within 3 Days)  Patient/caregiver understands and will follow disposition?: Yes  **Appt. Scheduled for 9/18**        Copied from CRM #[id number]. Topic: Clinical - Red Word Triage >> Mar 14, 2024 10:57 AM Rea ORN wrote: Red Word that prompted transfer to Nurse Triage: Pt state there is a cyst on the back neck that has tripled in size and is painful. Reason for Disposition  [1] Small swelling or lump AND [2] unexplained AND [3] present > 1 week  Answer Assessment - Initial Assessment Questions 1. APPEARANCE What does it look like?     Patient states the lump is under the skin  2. SIZE: How large is the swelling? (e.g., inches, cm; or compare to size of pinhead, tip of pen, eraser, coin, pea, grape, ping pong ball)       Small pea to acorn size   3. LOCATION: Back of neck       4. ONSET:      Ongoing x 1 year, provider aware   5. COLOR: What color is it? Is there more than one color?     Same skin tone, no redness   6. PAIN: Is there any pain? If Yes, ask: How bad is the pain? (Scale 1-10; or mild, moderate, severe)       4/10  7. ITCH: Does it itch? If Yes, ask: How bad is the itch?      No   8. CAUSE: Unknown        9 OTHER SYMPTOMS: Do you have any other symptoms? (e.g., fever)  Tender with touch, burning sensation, no other symptoms noted. PCP is aware, but patient reports a change in size, and now the area is painful and was not before.  Protocols used: Skin Lump or Localized Swelling-A-AH

## 2024-03-15 ENCOUNTER — Encounter: Payer: Self-pay | Admitting: Adult Health

## 2024-03-15 ENCOUNTER — Ambulatory Visit (INDEPENDENT_AMBULATORY_CARE_PROVIDER_SITE_OTHER): Admitting: Adult Health

## 2024-03-15 VITALS — BP 120/60 | HR 87 | Temp 98.1°F | Ht 68.0 in | Wt 194.0 lb

## 2024-03-15 DIAGNOSIS — M7918 Myalgia, other site: Secondary | ICD-10-CM | POA: Diagnosis not present

## 2024-03-15 DIAGNOSIS — L0291 Cutaneous abscess, unspecified: Secondary | ICD-10-CM | POA: Diagnosis not present

## 2024-03-15 MED ORDER — CYCLOBENZAPRINE HCL 5 MG PO TABS: 5.0000 mg | ORAL_TABLET | Freq: Every day | ORAL | 0 refills | Status: DC

## 2024-03-15 MED ORDER — DOXYCYCLINE HYCLATE 100 MG PO CAPS: 100.0000 mg | ORAL_CAPSULE | Freq: Two times a day (BID) | ORAL | 0 refills | Status: AC

## 2024-03-15 NOTE — Progress Notes (Signed)
 Subjective:    Patient ID: Frank [name], male    DOB: [birth year], 73 y.o.   MRN: [id number]  HPI 73 year old male who  has a past medical history of Anxiety, Depression, Drug addiction (HCC), High cholesterol, Hypertensive urgency, Prostate cancer (HCC), and Tinnitus.  He presents to the office today for concern of a  lump on the back of his neck on the right side. Reports that over the last week this lump has doubled in size and has become painful. He has not had any drainage, fevers, or chills.   Additionally, he needs a refill on Flexeril  that he takes for Myofacial pain syndrome.    Review of Systems See HPI   Past Medical History:  Diagnosis Date   Anxiety    Depression    Drug addiction (HCC)    High cholesterol    Hypertensive urgency    Prostate cancer (HCC)    Tinnitus     Social History   Socioeconomic History   Marital status: Divorced    Spouse name: Not on file   Number of children: Not on file   Years of education: Not on file   Highest education level: Bachelor's degree (e.g., BA, AB, BS)  Occupational History   Not on file  Tobacco Use   Smoking status: Never   Smokeless tobacco: Never  Vaping Use   Vaping status: Never Used  Substance and Sexual Activity   Alcohol use: Yes    Alcohol/week: 10.0 standard drinks of alcohol    Types: 10 Glasses of wine per week   Drug use: Yes    Types: Other-see comments, Crack cocaine, Cocaine, Marijuana    Comment: current marijuana use  twice a month    Sexual activity: Not on file    Comment: oxycontin  Other Topics Concern   Not on file  Social History Narrative   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    Social Drivers of Corporate investment banker Strain: Low Risk  (03/02/2024)   Overall Financial Resource Strain (CARDIA)    Difficulty of Paying Living Expenses: Not hard at all  Food Insecurity: No Food Insecurity (03/02/2024)   Hunger Vital  Sign    Worried About Running Out of Food in the Last Year: Never true    Ran Out of Food in the Last Year: Never true  Transportation Needs: No Transportation Needs (03/02/2024)   PRAPARE - Administrator, Civil Service (Medical): No    Lack of Transportation (Non-Medical): No  Physical Activity: Insufficiently Active (03/02/2024)   Exercise Vital Sign    Days of Exercise per Week: 2 days    Minutes of Exercise per Session: 60 min  Stress: No Stress Concern Present (03/02/2024)   [employer] of Occupational Health - Occupational Stress Questionnaire    Feeling of Stress: Not at all  Social Connections: Socially Isolated (03/02/2024)   Social Connection and Isolation Panel    Frequency of Communication with Friends and Family: More than three times a week    Frequency of Social Gatherings with Friends and Family: More than three times a week    Attends Religious Services: Never    Database administrator or Organizations: No    Attends Banker Meetings: Never    Marital Status: Divorced  Catering manager Violence: Not At Risk (03/02/2024)   Humiliation, Afraid,  Rape, and Kick questionnaire    Fear of Current or Ex-Partner: No    Emotionally Abused: No    Physically Abused: No    Sexually Abused: No    Past Surgical History:  Procedure Laterality Date   ESOPHAGOGASTRODUODENOSCOPY N/A 11/04/2021   Procedure: ESOPHAGOGASTRODUODENOSCOPY (EGD);  Surgeon: Stacia Glendia BRAVO, MD;  Location: [facility];  Service: Gastroenterology;  Laterality: N/A;   SKIN CANCER EXCISION  2015   TONSILLECTOMY     WISDOM TOOTH EXTRACTION     nausea post op    Family History  Problem Relation Age of Onset   Breast cancer Mother        Died at 28   Lung cancer Father    Prostate cancer Father        Died at 41    Colon cancer Paternal Grandmother    Esophageal cancer Neg Hx    Rectal cancer Neg Hx    Stomach cancer Neg Hx    Colon polyps Neg Hx     No Known  Allergies  Current Outpatient Medications on File Prior to Visit  Medication Sig Dispense Refill   amLODipine  (NORVASC ) 10 MG tablet TAKE 1 TABLET(10 MG) BY MOUTH DAILY 90 tablet 0   atorvastatin  (LIPITOR) 40 MG tablet TAKE 1 TABLET BY MOUTH DAILY 90 tablet 0   gabapentin  (NEURONTIN ) 300 MG capsule TAKE 1 CAPSULE(300 MG) BY MOUTH TWICE DAILY 180 capsule 1   ibuprofen  (ADVIL ) 600 MG tablet Take 600 mg by mouth every 4 (four) hours as needed.     lisinopril  (ZESTRIL ) 40 MG tablet TAKE 1 TABLET BY MOUTH DAILY 90 tablet 0   QUEtiapine  (SEROQUEL ) 200 MG tablet TAKE 1 TABLET(200 MG) BY MOUTH AT BEDTIME 90 tablet 0   sildenafil (VIAGRA) 50 MG tablet Take 50 mg by mouth as needed for erectile dysfunction.     tamsulosin  (FLOMAX ) 0.4 MG CAPS capsule Take 2 capsules (0.8 mg total) by mouth daily after supper. 60 capsule 5   cyclobenzaprine  (FLEXERIL ) 5 MG tablet Take 1 tablet (5 mg total) by mouth at bedtime. (Patient not taking: Reported on 03/15/2024) 30 tablet 0   DULoxetine  (CYMBALTA ) 20 MG capsule TAKE 1 CAPSULE(20 MG) BY MOUTH TWICE DAILY (Patient not taking: Reported on 03/15/2024) 60 capsule 1   meloxicam  (MOBIC ) 15 MG tablet TAKE 1 TABLET(15 MG) BY MOUTH DAILY (Patient not taking: Reported on 03/08/2023) 90 tablet 1   Multiple Vitamin (MULTIVITAMIN ADULT PO) Take 1,500 mcg by mouth daily.     Current Facility-Administered Medications on File Prior to Visit  Medication Dose Route Frequency Provider Last Rate Last Admin   lidocaine  (XYLOCAINE ) 1 % (with pres) injection 4 mL  4 mL Other Once Kirsteins, Prentice BRAVO, MD        BP 120/60   Pulse 87   Temp 98.1 F (36.7 C) (Oral)   Ht 5' 8 (1.727 m)   Wt 194 lb (88 kg)   SpO2 96%   BMI 29.50 kg/m       Objective:   Physical Exam Vitals and nursing note reviewed.  Constitutional:      Appearance: Normal appearance.  Neck:   Musculoskeletal:        General: Normal range of motion.  Skin:    General: Skin is warm and dry.     Capillary  Refill: Capillary refill takes less than 2 seconds.  Neurological:     General: No focal deficit present.     Mental Status: He is  alert and oriented to person, place, and time.  Psychiatric:        Mood and Affect: Mood normal.        Behavior: Behavior normal.        Thought Content: Thought content normal.        Judgment: Judgment normal.       Assessment & Plan:  1. Abscess (Primary) - Incision  and drainage not indicated at this time. Will place on Doxycycline . Advised warm compress and if drainage starts to follow up for I&D - doxycycline  (VIBRAMYCIN ) 100 MG capsule; Take 1 capsule (100 mg total) by mouth 2 (two) times daily for 10 days.  Dispense: 20 capsule; Refill: 0  2. Myofascial pain dysfunction syndrome  - cyclobenzaprine  (FLEXERIL ) 5 MG tablet; Take 1 tablet (5 mg total) by mouth at bedtime.  Dispense: 30 tablet; Refill: 0   Darleene Shape, NP  I personally spent a total of 31 minutes in the care of the patient today including preparing to see the patient, getting/reviewing separately obtained history, performing a medically appropriate exam/evaluation, counseling and educating, placing orders, and documenting clinical information in the EHR.

## 2024-03-20 ENCOUNTER — Ambulatory Visit: Payer: Self-pay

## 2024-03-20 NOTE — Telephone Encounter (Signed)
 FYI Only or Action Required?: FYI only for provider.  Patient was last seen in primary care on 03/15/2024 by Merna Huxley, NP.  Called Nurse Triage reporting Cyst.  Symptoms began several months ago.  Interventions attempted: Nothing.  Symptoms are: gradually worsening.  Triage Disposition: See Physician Within 24 Hours  Patient/caregiver understands and will follow disposition?: Yes      Copied from CRM #[id number]. Topic: Clinical - Red Word Triage >> Mar 20, 2024  1:53 PM Wess RAMAN wrote: Red Word that prompted transfer to Nurse Triage: Patient has cyst on back on neck that is painful and has gotten larger. Patient states it is getting worse and antibiotics are not working. Reason for Disposition  [1] Treatment (antibiotic, I&D, or moist heat) > 24 hours AND [2] symptoms WORSE (e.g., pain, spreading redness, swelling)  Answer Assessment - Initial Assessment Questions 1. APPEARANCE: What does the boil (abscess) look like?      Seen last week and states it's still red and larger, very painful and oozing pus 2. LOCATION: Where is the boil located?      Back of neck 3. NUMBER: How many boils are there?      1 4. SIZE: How big is the boil? (e.g., inches, cm; compare to size of a coin or other object)     Quarter size 5. ONSET: When did the boil start?     Over a year ago 6. PAIN: Is there any pain? If Yes, ask: How bad is the pain?  (Scale 1-10; or mild, moderate, severe)     severe 7. FEVER: Do you have a fever? If Yes, ask: What is it, how was it measured, and when did it start?      no 8. TREATMENT: What treatment did you get or are you getting for the boil? (e.g., I&D, antibiotics, moist heat)     antibiotics 9. OTHER SYMPTOMS: Do you have any other symptoms? (e.g., rash elsewhere on body, shaking chills, spreading redness of nearby skin or red streaks, weakness)      Getting worse 10. PREGNANCY: Is there any chance you are pregnant? When was  your last menstrual period?       na  Protocols used: Boil (Skin Abscess) on Treatment Follow-up Call-A-[facility]

## 2024-03-21 ENCOUNTER — Ambulatory Visit: Admitting: Adult Health

## 2024-04-16 ENCOUNTER — Other Ambulatory Visit: Payer: Self-pay | Admitting: Adult Health

## 2024-04-16 DIAGNOSIS — M7918 Myalgia, other site: Secondary | ICD-10-CM

## 2024-04-16 DIAGNOSIS — F5101 Primary insomnia: Secondary | ICD-10-CM

## 2024-04-16 DIAGNOSIS — F324 Major depressive disorder, single episode, in partial remission: Secondary | ICD-10-CM

## 2024-04-17 ENCOUNTER — Other Ambulatory Visit: Payer: Self-pay | Admitting: Adult Health

## 2024-04-17 DIAGNOSIS — M7918 Myalgia, other site: Secondary | ICD-10-CM

## 2024-04-17 DIAGNOSIS — F5101 Primary insomnia: Secondary | ICD-10-CM

## 2024-04-17 DIAGNOSIS — F324 Major depressive disorder, single episode, in partial remission: Secondary | ICD-10-CM

## 2024-04-17 NOTE — Telephone Encounter (Signed)
 Patient need to schedule CPE for more refills.

## 2024-04-24 ENCOUNTER — Other Ambulatory Visit: Payer: Self-pay | Admitting: Adult Health

## 2024-04-24 DIAGNOSIS — I10 Essential (primary) hypertension: Secondary | ICD-10-CM

## 2024-05-09 ENCOUNTER — Other Ambulatory Visit: Payer: Self-pay | Admitting: Adult Health

## 2024-05-09 DIAGNOSIS — M7918 Myalgia, other site: Secondary | ICD-10-CM

## 2024-05-09 DIAGNOSIS — G629 Polyneuropathy, unspecified: Secondary | ICD-10-CM

## 2024-05-22 ENCOUNTER — Telehealth: Payer: Self-pay | Admitting: *Deleted

## 2024-05-22 ENCOUNTER — Telehealth: Payer: Self-pay

## 2024-05-22 MED ORDER — ATORVASTATIN CALCIUM 40 MG PO TABS: 40.0000 mg | ORAL_TABLET | Freq: Every day | ORAL | 0 refills | Status: AC

## 2024-05-22 NOTE — Progress Notes (Signed)
   05/22/2024  Patient ID: Frank [name], Frank [name]   DOB: Oct 21, 1950, 73 y.o.   MRN: [id number]  Pharmacy Quality Measure Review  This patient is appearing on a report for being at risk of failing the adherence measure for cholesterol (statin) medications this calendar year.   Medication: Atorvastatin  40mg  Last fill date: 01/23/24 for 90 day supply  Will collaborate with provider to facilitate refill needs.  Frank [name], PharmD Clinical Pharmacist [phone]

## 2024-05-22 NOTE — Telephone Encounter (Signed)
-----   Message from [facility] sent at 05/22/2024 11:19 AM EST ----- Ok for 90 days ----- Message ----- From: Kathryne Vernell NOVAK, CMA Sent: 05/22/2024  11:03 AM EST To: Darleene Shape, NP  Okay to refill? ----- Message ----- From: Lionell Jon DEL, [facility] Sent: 05/22/2024  10:47 AM EST To: Shape Me  Hello,  Pharmacy requesting refill for the following prescription:  Atorvastatin  40mg   1 tablet by mouth daily   Pharmacy Info: [id number] [address], [place] - [address] AT [facility] [address] P: [phone] F: [phone]  Thank you, Jon DEL Lionell, PharmD Clinical Pharmacist [phone]

## 2024-06-05 ENCOUNTER — Other Ambulatory Visit: Payer: Self-pay | Admitting: Adult Health

## 2024-06-05 DIAGNOSIS — M7918 Myalgia, other site: Secondary | ICD-10-CM

## 2024-06-06 NOTE — Telephone Encounter (Signed)
 Pt is due for CPE for further refills

## 2024-06-09 ENCOUNTER — Other Ambulatory Visit: Payer: Self-pay | Admitting: Adult Health

## 2024-06-09 DIAGNOSIS — F324 Major depressive disorder, single episode, in partial remission: Secondary | ICD-10-CM

## 2024-06-09 DIAGNOSIS — F5101 Primary insomnia: Secondary | ICD-10-CM

## 2024-06-14 NOTE — Telephone Encounter (Signed)
 Patient need to schedule for more refills.

## 2024-07-08 ENCOUNTER — Other Ambulatory Visit: Payer: Self-pay | Admitting: Adult Health

## 2024-07-08 DIAGNOSIS — M7918 Myalgia, other site: Secondary | ICD-10-CM

## 2025-03-08 ENCOUNTER — Ambulatory Visit
